# Patient Record
Sex: Male | Born: 1940 | Race: White | Hispanic: No | Marital: Married | Smoking: Former smoker
Health system: Southern US, Community
[De-identification: ages and names within clinical notes are randomized; demographics above are authoritative.]

## PROBLEM LIST (undated history)

## (undated) DIAGNOSIS — J189 Pneumonia, unspecified organism: Secondary | ICD-10-CM

## (undated) DIAGNOSIS — Z9289 Personal history of other medical treatment: Secondary | ICD-10-CM

## (undated) DIAGNOSIS — I219 Acute myocardial infarction, unspecified: Secondary | ICD-10-CM

## (undated) DIAGNOSIS — I509 Heart failure, unspecified: Secondary | ICD-10-CM

## (undated) DIAGNOSIS — I2510 Atherosclerotic heart disease of native coronary artery without angina pectoris: Secondary | ICD-10-CM

## (undated) DIAGNOSIS — I255 Ischemic cardiomyopathy: Secondary | ICD-10-CM

## (undated) DIAGNOSIS — I5022 Chronic systolic (congestive) heart failure: Secondary | ICD-10-CM

## (undated) DIAGNOSIS — M1990 Unspecified osteoarthritis, unspecified site: Secondary | ICD-10-CM

## (undated) DIAGNOSIS — R011 Cardiac murmur, unspecified: Secondary | ICD-10-CM

## (undated) DIAGNOSIS — D509 Iron deficiency anemia, unspecified: Secondary | ICD-10-CM

## (undated) DIAGNOSIS — E785 Hyperlipidemia, unspecified: Secondary | ICD-10-CM

## (undated) HISTORY — DX: Chronic systolic (congestive) heart failure: I50.22

## (undated) HISTORY — DX: Ischemic cardiomyopathy: I25.5

## (undated) HISTORY — PX: BAND HEMORRHOIDECTOMY: SHX1213

## (undated) HISTORY — DX: Hyperlipidemia, unspecified: E78.5

## (undated) HISTORY — PX: CARDIAC CATHETERIZATION: SHX172

## (undated) HISTORY — DX: Atherosclerotic heart disease of native coronary artery without angina pectoris: I25.10

## (undated) HISTORY — PX: APPENDECTOMY: SHX54

---
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
## 1979-01-26 DIAGNOSIS — I219 Acute myocardial infarction, unspecified: Secondary | ICD-10-CM

## 1979-01-26 HISTORY — DX: Acute myocardial infarction, unspecified: I21.9

## 2005-01-25 HISTORY — PX: AORTIC VALVE REPAIR: SHX6306

## 2005-01-25 HISTORY — PX: CARDIAC DEFIBRILLATOR PLACEMENT: SHX171

## 2005-01-25 HISTORY — PX: CORONARY ARTERY BYPASS GRAFT: SHX141

## 2005-02-04 ENCOUNTER — Ambulatory Visit: Payer: Self-pay | Admitting: Cardiology

## 2005-02-12 ENCOUNTER — Ambulatory Visit: Payer: Self-pay

## 2005-02-12 ENCOUNTER — Encounter: Payer: Self-pay | Admitting: Cardiology

## 2005-02-25 ENCOUNTER — Ambulatory Visit: Payer: Self-pay | Admitting: Cardiology

## 2005-03-02 ENCOUNTER — Ambulatory Visit: Payer: Self-pay | Admitting: Cardiology

## 2005-03-02 ENCOUNTER — Inpatient Hospital Stay (HOSPITAL_BASED_OUTPATIENT_CLINIC_OR_DEPARTMENT_OTHER): Admission: RE | Admit: 2005-03-02 | Discharge: 2005-03-02 | Payer: Self-pay | Admitting: Cardiology

## 2005-03-02 ENCOUNTER — Inpatient Hospital Stay (HOSPITAL_COMMUNITY): Admission: AD | Admit: 2005-03-02 | Discharge: 2005-03-19 | Payer: Self-pay | Admitting: Cardiology

## 2005-03-09 ENCOUNTER — Ambulatory Visit: Payer: Self-pay | Admitting: Internal Medicine

## 2005-03-11 ENCOUNTER — Encounter: Payer: Self-pay | Admitting: Thoracic Surgery (Cardiothoracic Vascular Surgery)

## 2005-03-22 ENCOUNTER — Ambulatory Visit: Payer: Self-pay | Admitting: Internal Medicine

## 2005-03-26 ENCOUNTER — Ambulatory Visit: Payer: Self-pay | Admitting: Cardiology

## 2005-04-07 ENCOUNTER — Ambulatory Visit: Payer: Self-pay | Admitting: Cardiology

## 2005-04-12 ENCOUNTER — Encounter
Admission: RE | Admit: 2005-04-12 | Discharge: 2005-04-12 | Payer: Self-pay | Admitting: Thoracic Surgery (Cardiothoracic Vascular Surgery)

## 2005-04-16 ENCOUNTER — Ambulatory Visit: Payer: Self-pay | Admitting: Cardiovascular Disease

## 2005-04-22 ENCOUNTER — Encounter (HOSPITAL_COMMUNITY): Admission: RE | Admit: 2005-04-22 | Discharge: 2005-07-21 | Payer: Self-pay | Admitting: Cardiology

## 2005-04-29 ENCOUNTER — Ambulatory Visit: Payer: Self-pay | Admitting: Cardiology

## 2005-05-04 ENCOUNTER — Ambulatory Visit: Payer: Self-pay | Admitting: Cardiology

## 2005-05-11 ENCOUNTER — Ambulatory Visit: Payer: Self-pay | Admitting: Cardiology

## 2005-05-21 ENCOUNTER — Ambulatory Visit: Payer: Self-pay | Admitting: Cardiology

## 2005-06-04 ENCOUNTER — Ambulatory Visit: Payer: Self-pay | Admitting: Internal Medicine

## 2005-06-10 ENCOUNTER — Ambulatory Visit: Payer: Self-pay

## 2005-06-10 ENCOUNTER — Encounter: Payer: Self-pay | Admitting: Cardiology

## 2005-06-11 ENCOUNTER — Ambulatory Visit: Payer: Self-pay | Admitting: Internal Medicine

## 2005-06-24 ENCOUNTER — Ambulatory Visit: Payer: Self-pay | Admitting: Internal Medicine

## 2005-06-29 ENCOUNTER — Ambulatory Visit: Payer: Self-pay | Admitting: Internal Medicine

## 2005-06-29 ENCOUNTER — Inpatient Hospital Stay (HOSPITAL_COMMUNITY): Admission: RE | Admit: 2005-06-29 | Discharge: 2005-06-30 | Payer: Self-pay | Admitting: Internal Medicine

## 2005-07-07 ENCOUNTER — Ambulatory Visit: Payer: Self-pay | Admitting: Cardiovascular Disease

## 2005-07-14 ENCOUNTER — Ambulatory Visit: Payer: Self-pay

## 2005-07-22 ENCOUNTER — Encounter (HOSPITAL_COMMUNITY): Admission: RE | Admit: 2005-07-22 | Discharge: 2005-10-20 | Payer: Self-pay | Admitting: Cardiology

## 2005-08-05 ENCOUNTER — Ambulatory Visit: Payer: Self-pay | Admitting: Cardiology

## 2005-10-12 ENCOUNTER — Ambulatory Visit: Payer: Self-pay | Admitting: Internal Medicine

## 2005-11-26 ENCOUNTER — Ambulatory Visit: Payer: Self-pay

## 2006-01-11 ENCOUNTER — Ambulatory Visit: Payer: Self-pay | Admitting: Internal Medicine

## 2006-02-08 ENCOUNTER — Ambulatory Visit: Payer: Self-pay | Admitting: Cardiology

## 2006-04-12 ENCOUNTER — Ambulatory Visit: Payer: Self-pay | Admitting: Internal Medicine

## 2006-07-12 ENCOUNTER — Ambulatory Visit: Payer: Self-pay | Admitting: Internal Medicine

## 2006-07-26 ENCOUNTER — Ambulatory Visit: Payer: Self-pay | Admitting: Cardiology

## 2006-09-27 ENCOUNTER — Ambulatory Visit: Payer: Self-pay | Admitting: Internal Medicine

## 2006-11-03 ENCOUNTER — Ambulatory Visit: Payer: Self-pay | Admitting: Internal Medicine

## 2006-12-27 ENCOUNTER — Ambulatory Visit: Payer: Self-pay | Admitting: Internal Medicine

## 2007-03-28 ENCOUNTER — Ambulatory Visit: Payer: Self-pay | Admitting: Internal Medicine

## 2007-06-27 ENCOUNTER — Ambulatory Visit: Payer: Self-pay | Admitting: Internal Medicine

## 2007-09-04 ENCOUNTER — Ambulatory Visit: Payer: Self-pay | Admitting: Cardiovascular Disease

## 2007-10-17 ENCOUNTER — Ambulatory Visit: Payer: Self-pay | Admitting: Internal Medicine

## 2007-10-17 ENCOUNTER — Ambulatory Visit: Payer: Self-pay

## 2007-10-17 ENCOUNTER — Encounter: Payer: Self-pay | Admitting: Cardiovascular Disease

## 2008-01-16 ENCOUNTER — Ambulatory Visit: Payer: Self-pay | Admitting: Internal Medicine

## 2008-03-07 ENCOUNTER — Encounter: Payer: Self-pay | Admitting: Internal Medicine

## 2008-03-07 ENCOUNTER — Ambulatory Visit: Payer: Self-pay | Admitting: Family Medicine

## 2008-03-07 DIAGNOSIS — M109 Gout, unspecified: Secondary | ICD-10-CM | POA: Insufficient documentation

## 2008-03-07 DIAGNOSIS — E785 Hyperlipidemia, unspecified: Secondary | ICD-10-CM | POA: Insufficient documentation

## 2008-03-07 DIAGNOSIS — I5023 Acute on chronic systolic (congestive) heart failure: Secondary | ICD-10-CM | POA: Insufficient documentation

## 2008-03-07 DIAGNOSIS — Z95810 Presence of automatic (implantable) cardiac defibrillator: Secondary | ICD-10-CM | POA: Insufficient documentation

## 2008-03-07 DIAGNOSIS — I2510 Atherosclerotic heart disease of native coronary artery without angina pectoris: Secondary | ICD-10-CM | POA: Insufficient documentation

## 2008-03-19 ENCOUNTER — Ambulatory Visit: Payer: Self-pay | Admitting: Family Medicine

## 2008-03-28 LAB — CONVERTED CEMR LAB
Albumin: 4 g/dL (ref 3.5–5.2)
Chloride: 105 meq/L (ref 96–112)
Cholesterol: 135 mg/dL (ref 0–200)
Creatinine, Ser: 1.2 mg/dL (ref 0.4–1.5)
GFR calc Af Amer: 78 mL/min
HDL: 26.3 mg/dL — ABNORMAL LOW (ref >39.0)
LDL Cholesterol: 77 mg/dL (ref 0–99)
PSA: 1.3 ng/mL (ref 0.10–4.00)
Potassium: 4.4 meq/L (ref 3.5–5.1)
Sodium: 142 meq/L (ref 135–145)
Total CHOL/HDL Ratio: 5.1
Triglycerides: 160 mg/dL — ABNORMAL HIGH (ref 0–149)

## 2008-04-15 ENCOUNTER — Ambulatory Visit: Payer: Self-pay | Admitting: Family Medicine

## 2008-04-15 LAB — CONVERTED CEMR LAB
Cholesterol, target level: 200 mg/dL
HDL goal, serum: 40 mg/dL

## 2008-04-16 ENCOUNTER — Ambulatory Visit: Payer: Self-pay | Admitting: Internal Medicine

## 2008-05-27 ENCOUNTER — Telehealth: Payer: Self-pay | Admitting: Family Medicine

## 2008-05-27 ENCOUNTER — Ambulatory Visit: Payer: Self-pay | Admitting: Family Medicine

## 2008-05-31 ENCOUNTER — Telehealth: Payer: Self-pay | Admitting: Family Medicine

## 2008-05-31 DIAGNOSIS — B029 Zoster without complications: Secondary | ICD-10-CM | POA: Insufficient documentation

## 2008-06-07 ENCOUNTER — Telehealth: Payer: Self-pay | Admitting: Family Medicine

## 2008-06-07 ENCOUNTER — Encounter: Payer: Self-pay | Admitting: Family Medicine

## 2008-06-28 ENCOUNTER — Ambulatory Visit: Payer: Self-pay | Admitting: Family Medicine

## 2008-07-02 LAB — CONVERTED CEMR LAB
Cholesterol: 159 mg/dL (ref 0–200)
LDL Cholesterol: 94 mg/dL (ref 0–99)
VLDL: 30.8 mg/dL (ref 0.0–40.0)

## 2008-07-16 ENCOUNTER — Ambulatory Visit: Payer: Self-pay | Admitting: Internal Medicine

## 2008-07-29 ENCOUNTER — Encounter: Payer: Self-pay | Admitting: Internal Medicine

## 2008-08-20 ENCOUNTER — Telehealth: Payer: Self-pay | Admitting: Family Medicine

## 2008-09-05 DIAGNOSIS — I2589 Other forms of chronic ischemic heart disease: Secondary | ICD-10-CM | POA: Insufficient documentation

## 2008-09-10 ENCOUNTER — Ambulatory Visit: Payer: Self-pay | Admitting: Cardiovascular Disease

## 2008-10-04 ENCOUNTER — Ambulatory Visit: Payer: Self-pay | Admitting: Family Medicine

## 2008-10-04 LAB — CONVERTED CEMR LAB
AST: 55 units/L — ABNORMAL HIGH (ref 0–37)
Cholesterol: 112 mg/dL (ref 0–200)
HDL: 34.4 mg/dL — ABNORMAL LOW (ref >39.00)
LDL Cholesterol: 58 mg/dL (ref 0–99)
Triglycerides: 100 mg/dL (ref 0.0–149.0)

## 2008-10-15 ENCOUNTER — Ambulatory Visit: Payer: Self-pay | Admitting: Family Medicine

## 2008-10-15 ENCOUNTER — Telehealth: Payer: Self-pay | Admitting: Family Medicine

## 2008-10-15 DIAGNOSIS — R740 Nonspecific elevation of levels of transaminase and lactic acid dehydrogenase [LDH]: Secondary | ICD-10-CM

## 2008-10-21 ENCOUNTER — Telehealth: Payer: Self-pay | Admitting: Family Medicine

## 2008-11-19 ENCOUNTER — Ambulatory Visit: Payer: Self-pay | Admitting: Internal Medicine

## 2008-12-04 ENCOUNTER — Telehealth: Payer: Self-pay | Admitting: Family Medicine

## 2009-01-13 ENCOUNTER — Ambulatory Visit: Payer: Self-pay | Admitting: Family Medicine

## 2009-01-19 LAB — CONVERTED CEMR LAB
AST: 51 units/L — ABNORMAL HIGH (ref 0–37)
Albumin: 4.1 g/dL (ref 3.5–5.2)
Bilirubin, Direct: 0.2 mg/dL (ref 0.0–0.3)
CO2: 30 meq/L (ref 19–32)
Cholesterol: 118 mg/dL (ref 0–200)
GFR calc non Af Amer: 70.74 mL/min (ref >60)
HDL: 33.4 mg/dL — ABNORMAL LOW (ref >39.00)
LDL Cholesterol: 69 mg/dL (ref 0–99)
Sodium: 138 meq/L (ref 135–145)
Total Protein: 7.2 g/dL (ref 6.0–8.3)
Triglycerides: 80 mg/dL (ref 0.0–149.0)
VLDL: 16 mg/dL (ref 0.0–40.0)

## 2009-01-20 ENCOUNTER — Telehealth: Payer: Self-pay | Admitting: Family Medicine

## 2009-01-23 ENCOUNTER — Encounter: Admission: RE | Admit: 2009-01-23 | Discharge: 2009-01-23 | Payer: Self-pay | Admitting: Family Medicine

## 2009-01-27 ENCOUNTER — Ambulatory Visit: Payer: Self-pay | Admitting: Family Medicine

## 2009-01-28 LAB — CONVERTED CEMR LAB
ALT: 47 units/L (ref 0–53)
BUN: 15 mg/dL (ref 6–23)
Bilirubin, Direct: 0.2 mg/dL (ref 0.0–0.3)
Chloride: 101 meq/L (ref 96–112)
Creatinine, Ser: 1.2 mg/dL (ref 0.4–1.5)
GFR calc non Af Amer: 63.98 mL/min (ref >60)
Potassium: 4.6 meq/L (ref 3.5–5.1)

## 2009-01-29 LAB — CONVERTED CEMR LAB
HCV Ab: NEGATIVE
Hep B C IgM: NEGATIVE

## 2009-02-18 ENCOUNTER — Ambulatory Visit: Payer: Self-pay | Admitting: Internal Medicine

## 2009-02-25 ENCOUNTER — Telehealth: Payer: Self-pay | Admitting: Family Medicine

## 2009-02-26 ENCOUNTER — Encounter: Payer: Self-pay | Admitting: Internal Medicine

## 2009-03-25 ENCOUNTER — Telehealth: Payer: Self-pay | Admitting: Family Medicine

## 2009-04-02 ENCOUNTER — Ambulatory Visit: Payer: Self-pay | Admitting: Family Medicine

## 2009-04-07 LAB — CONVERTED CEMR LAB
AST: 46 units/L — ABNORMAL HIGH (ref 0–37)
BUN: 14 mg/dL (ref 6–23)
Basophils Absolute: 0 10*3/uL (ref 0.0–0.1)
Basophils Relative: 0.7 % (ref 0.0–3.0)
Chloride: 104 meq/L (ref 96–112)
Creatinine, Ser: 1.1 mg/dL (ref 0.4–1.5)
Eosinophils Absolute: 0.2 10*3/uL (ref 0.0–0.7)
Glucose, Bld: 108 mg/dL — ABNORMAL HIGH (ref 70–99)
HDL: 38.1 mg/dL — ABNORMAL LOW (ref >39.00)
Hemoglobin: 13.8 g/dL (ref 13.0–17.0)
Lymphocytes Relative: 29.1 % (ref 12.0–46.0)
Lymphs Abs: 1.9 10*3/uL (ref 0.7–4.0)
MCHC: 33 g/dL (ref 30.0–36.0)
MCV: 94.8 fL (ref 78.0–100.0)
Monocytes Relative: 11.1 % (ref 3.0–12.0)
Neutrophils Relative %: 56.4 % (ref 43.0–77.0)
RDW: 12.5 % (ref 11.5–14.6)
Total CHOL/HDL Ratio: 3

## 2009-04-08 ENCOUNTER — Ambulatory Visit: Payer: Self-pay | Admitting: Family Medicine

## 2009-04-08 DIAGNOSIS — R7309 Other abnormal glucose: Secondary | ICD-10-CM | POA: Insufficient documentation

## 2009-04-09 ENCOUNTER — Telehealth: Payer: Self-pay | Admitting: Family Medicine

## 2009-04-24 ENCOUNTER — Telehealth (INDEPENDENT_AMBULATORY_CARE_PROVIDER_SITE_OTHER): Payer: Self-pay | Admitting: *Deleted

## 2009-05-19 ENCOUNTER — Telehealth: Payer: Self-pay | Admitting: Family Medicine

## 2009-05-20 ENCOUNTER — Ambulatory Visit: Payer: Self-pay | Admitting: Internal Medicine

## 2009-06-05 ENCOUNTER — Encounter: Payer: Self-pay | Admitting: Internal Medicine

## 2009-06-27 ENCOUNTER — Telehealth: Payer: Self-pay | Admitting: Family Medicine

## 2009-07-21 ENCOUNTER — Telehealth: Payer: Self-pay | Admitting: Family Medicine

## 2009-08-19 ENCOUNTER — Telehealth: Payer: Self-pay | Admitting: Family Medicine

## 2009-08-21 ENCOUNTER — Ambulatory Visit: Payer: Self-pay | Admitting: Internal Medicine

## 2009-08-26 ENCOUNTER — Ambulatory Visit: Payer: Self-pay | Admitting: Cardiovascular Disease

## 2009-08-26 ENCOUNTER — Ambulatory Visit (HOSPITAL_COMMUNITY): Admission: RE | Admit: 2009-08-26 | Discharge: 2009-08-26 | Payer: Self-pay | Admitting: Cardiovascular Disease

## 2009-08-26 ENCOUNTER — Encounter: Payer: Self-pay | Admitting: Cardiovascular Disease

## 2009-08-26 ENCOUNTER — Ambulatory Visit: Payer: Self-pay | Admitting: Cardiology

## 2009-08-26 ENCOUNTER — Encounter: Payer: Self-pay | Admitting: Internal Medicine

## 2009-08-27 ENCOUNTER — Telehealth: Payer: Self-pay | Admitting: Family Medicine

## 2009-09-24 ENCOUNTER — Telehealth: Payer: Self-pay | Admitting: Family Medicine

## 2009-09-30 ENCOUNTER — Ambulatory Visit: Payer: Self-pay | Admitting: Family Medicine

## 2009-09-30 LAB — CONVERTED CEMR LAB
AST: 44 units/L — ABNORMAL HIGH (ref 0–37)
Albumin: 4.1 g/dL (ref 3.5–5.2)
BUN: 20 mg/dL (ref 6–23)
CO2: 28 meq/L (ref 19–32)
Chloride: 105 meq/L (ref 96–112)
Cholesterol: 117 mg/dL (ref 0–200)
Creatinine, Ser: 1 mg/dL (ref 0.4–1.5)
Glucose, Bld: 97 mg/dL (ref 70–99)
Potassium: 4.4 meq/L (ref 3.5–5.1)
Total Bilirubin: 0.9 mg/dL (ref 0.3–1.2)
Total CHOL/HDL Ratio: 4
VLDL: 24.4 mg/dL (ref 0.0–40.0)

## 2009-10-08 ENCOUNTER — Ambulatory Visit: Payer: Self-pay | Admitting: Family Medicine

## 2009-11-18 ENCOUNTER — Ambulatory Visit: Payer: Self-pay | Admitting: Internal Medicine

## 2010-01-22 ENCOUNTER — Telehealth: Payer: Self-pay | Admitting: Family Medicine

## 2010-02-19 ENCOUNTER — Ambulatory Visit: Admit: 2010-02-19 | Payer: Self-pay | Admitting: Internal Medicine

## 2010-02-24 NOTE — Letter (Signed)
Summary: Remote Device Check  [facility], Main Office  [address]   [place], [place] [address]   Phone: [phone]  Fax: [phone]     Jun 05, 2009 MRN: [id number]   [name] Roberts [address] [place], [place]  [address]   Dear [name],   Your remote transmission was recieved and reviewed by your physician.  All diagnostics were within normal limits for you.  _____Your next transmission is scheduled for:  08-21-2009.  Please transmit at any time this day.  If you have a wireless device your transmission will be sent automatically.      Sincerely,  Vella Kohler

## 2010-02-24 NOTE — Progress Notes (Signed)
Summary: regarding niacin  Phone Note From Pharmacy   Caller: [facility]  [phone] Summary of Call: [facility] received script for naicin CR 500.  They are asking if you want to prescribe otc or niaspan.  Please advise. Initial call taken by: Lowella Petties CMA,  April 09, 2009 11:41 AM  Follow-up for Phone Call        spoke with patient and he was already taken OTC  medication discussed with dr Ermalene Searing and she said that was fine Follow-up by: Benny Lennert CMA Duncan Dull),  April 09, 2009 12:03 PM     Appended Document: regarding niacin Advised [facility] to cancel the script.

## 2010-02-24 NOTE — Assessment & Plan Note (Signed)
Summary: medtronic/saf      Allergies Added: NKDA  Visit Type:  Follow-up Primary Provider:  Kerby Nora MD   History of Present Illness: 70 yo WM with history of ICM s/p ICD, CAD s/p CABG, hyperlipidemia here today for routine cardiac follow up. He has been doing well. No complaints. He has been mowing his grass without chest pain or SOB. No ICD events. Lipids well controlled in March 2011 (see EMR).  No orthopnea, PND, lower ext edema. No intercurrent ICD therapies.  Current Medications (verified): 1)  Lisinopril 10 Mg Tabs (Lisinopril) .... Take 1 Tablet By Mouth Once A Day 2)  Carvedilol 6.25 Mg Tabs (Carvedilol) .... Take 1 Tablet By Mouth Two Times A Day 3)  Niacin Cr 500 Mg Cr-Tabs (Niacin) .Marland Kitchen.. 1 Tab By Mouth Qhs 4)  Furosemide 20 Mg Tabs (Furosemide) .... Take 1 Tablet By Mouth Once A Day 5)  Klor-Con 10 10 Meq Cr-Tabs (Potassium Chloride) .... Take 1 Tablet By Mouth Once A Day 6)  Alprazolam 1 Mg Tabs (Alprazolam) .... Take 1 Tablet By Mouth Once A Day As Needed Anxiety 7)  Simvastatin 40 Mg Tabs (Simvastatin) .Marland Kitchen.. 1 Tab By Mouth Daily 8)  Alrex 0.2 % Susp (Loteprednol Etabonate) .... One Drop Once Daily 9)  Aspirin 81 Mg  Tabs (Aspirin) .... Take 1 Tablet By Mouth Once A Day  Allergies (verified): No Known Drug Allergies  Past History:  Past Medical History: Last updated: 08/26/2009 CARDIOMYOPATHY, ISCHEMIC (ICD-414.8) CORONARY ARTERY DISEASE (ICD-414.00) s/p CABG CHRONIC SYSTOLIC HEART FAILURE (ICD-428.22) IMPLANTATION OF DEFIBRILLATOR, HX OF (ICD-V45.02) HYPERLIPIDEMIA (ICD-272.4) HERPES ZOSTER (ICD-053.9) SPECIAL SCREENING MALIGNANT NEOPLASM OF PROSTATE (ICD-V76.44) SCREENING FOR LIPOID DISORDERS (ICD-V77.91) GOUT (ICD-274.9) .  Past Surgical History: Last updated: 03/07/2008 MI age 32 Cath showed no plaque but vasospasm Cardiologist Dr.  Diona Browner. Macelheney Pneumonia 2006 CHF 2006 ECHO 2007 20%, immediately after CABG 30% Cath 2007 3 vessel  blockage CABG, ? valve repair 2007 Dr. Cornelius Moras Defibrillator Dr. Ladona Ridgel ECHO 25% 09/2007 Appendectomy   Review of Systems  The patient denies chest pain, syncope, dyspnea on exertion, and peripheral edema.    Vital Signs:  Patient profile:   70 year old male Height:      69 inches Weight:      169 pounds BMI:     25.05 Pulse rate:   58 / minute BP sitting:   82 / 50  (left arm)  Vitals Entered By: Laurance Flatten CMA (November 18, 2009 3:04 PM)  Physical Exam  General:  Well-developed,well-nourished,in no acute distress; alert,appropriate and cooperative throughout examination Head:  Normocephalic and atraumatic without obvious abnormalities. No apparent alopecia or balding. Eyes:  No corneal or conjunctival inflammation noted. EOMI. Perrla.  Vision grossly normal. Mouth:  Oral mucosa and oropharynx without lesions or exudates.  Teeth in good repair. Abdomen:  Bowel sounds positive,abdomen soft and non-tender without masses, organomegaly or hernias noted. Msk:  No deformity or scoliosis noted of thoracic or lumbar spine.   Pulses:  R and L posterior tibial pulses are full and equal bilaterally  Extremities:  no edema Neurologic:  Alert and oriented x 3.    ICD Specifications Following MD:  Lewayne Bunting, MD     ICD Vendor:  Medtronic     ICD Model Number:  7232     ICD Serial Number:  [id number] ICD DOI:  06/29/2005     ICD Implanting MD:  Lewayne Bunting, MD  Lead 1:    Location: RV  DOI: 06/29/2005     Model #: 9528     Serial #: [id number]     Status: active  Indications::  ICM   ICD Follow Up Remote Check?  No Battery Voltage:  3.06 V     Charge Time:  8.48 seconds     Underlying rhythm:  SR ICD Dependent:  No       ICD Device Measurements Right Ventricle:  Amplitude: 11.1 mV, Impedance: 448 ohms, Threshold: 1.0 V at 0.1 msec Shock Impedance: 46/58 ohms   Episodes Coumadin:  No Shock:  0     ATP:  0     Nonsustained:  0     Ventricular Pacing:  0.6%  Brady  Parameters Mode VVI     Lower Rate Limit:  40      Tachy Zones VF:  200     VT:  250     VT1:  176     Next Remote Date:  02/19/2010     Next Cardiology Appt Due:  10/26/2010 Tech Comments:  No parameter changes.  Device function normal.   6949 lead stable, SIC  0.  [facility]link transmissions every 3 months.  ROV 1 year with Dr. Ladona Ridgel. Altha Harm, LPN  November 18, 2009 3:14 PM  MD Comments:  Agree with above.  Impression & Recommendations:  Problem # 1:  IMPLANTATION OF DEFIBRILLATOR, HX OF (ICD-V45.02) His device is working normally.  Will recheck in several months.  Problem # 2:  CARDIOMYOPATHY, ISCHEMIC (ICD-414.8) He denies anginal symptoms. Continue current meds. His updated medication list for this problem includes:    Lisinopril 10 Mg Tabs (Lisinopril) .Marland Kitchen... Take 1 tablet by mouth once a day    Carvedilol 6.25 Mg Tabs (Carvedilol) .Marland Kitchen... Take 1 tablet by mouth two times a day    Furosemide 20 Mg Tabs (Furosemide) .Marland Kitchen... Take 1 tablet by mouth once a day    Aspirin 81 Mg Tabs (Aspirin) .Marland Kitchen... Take 1 tablet by mouth once a day  Problem # 3:  CHRONIC SYSTOLIC HEART FAILURE (ICD-428.22) I have asked him to reduce his sodium intake and increase his activity.  Continue meds as below. His updated medication list for this problem includes:    Lisinopril 10 Mg Tabs (Lisinopril) .Marland Kitchen... Take 1 tablet by mouth once a day    Carvedilol 6.25 Mg Tabs (Carvedilol) .Marland Kitchen... Take 1 tablet by mouth two times a day    Furosemide 20 Mg Tabs (Furosemide) .Marland Kitchen... Take 1 tablet by mouth once a day    Aspirin 81 Mg Tabs (Aspirin) .Marland Kitchen... Take 1 tablet by mouth once a day  Patient Instructions: 1)  Your physician wants you to follow-up in: 12 months with Dr Court Joy will receive a reminder letter in the mail two months in advance. If you don't receive a letter, please call our office to schedule the follow-up appointment.  Prevention & Chronic Care Immunizations   Influenza vaccine: given  (09/25/2008)    Influenza vaccine due: Refused  (10/08/2009)    Tetanus booster: 04/15/2008: Td   Tetanus booster due: 04/16/2018    Pneumococcal vaccine: given  (01/25/2006)   Pneumococcal vaccine due: 01/26/2011    H. zoster vaccine: Not documented  Colorectal Screening   Hemoccult: Not documented   Hemoccult due: Not Indicated    Colonoscopy: Hyperplastic Polyp  (01/25/2006)   Colonoscopy due: 03/08/2011  Other Screening   PSA: 2.40  (04/02/2009)   PSA due due: 03/19/2009   Smoking status: quit  (03/07/2008)  Lipids   Total Cholesterol: 117  (09/30/2009)   LDL: 60  (09/30/2009)   LDL Direct: Not documented   HDL: 32.90  (09/30/2009)   Triglycerides: 122.0  (09/30/2009)    SGOT (AST): 44  (09/30/2009)   SGPT (ALT): 45  (09/30/2009)   Alkaline phosphatase: 46  (09/30/2009)   Total bilirubin: 0.9  (09/30/2009)  Self-Management Support :    Lipid self-management support: Not documented

## 2010-02-24 NOTE — Progress Notes (Signed)
Summary: refill request for xanax  Phone Note Refill Request Message from:  Fax from Pharmacy  Refills Requested: Medication #1:  ALPRAZOLAM 0.5 MG TABS Take 1 tablet by mouth once a day   Last Refilled: 03/26/2009 Faxed [facility] [address] , [phone].  Initial call taken by: Lowella Petties CMA,  April 24, 2009 9:24 AM  Follow-up for Phone Call        Rx called to pharmacy Follow-up by: Benny Lennert CMA Duncan Dull),  April 24, 2009 4:38 PM    Prescriptions: ALPRAZOLAM 0.5 MG TABS (ALPRAZOLAM) Take 1 tablet by mouth once a day  #30 x 0   Entered and Authorized by:   Kerby Nora MD   Signed by:   Kerby Nora MD on 04/24/2009   Method used:   Telephoned to ...       [facility]  [address] [id number] ([facility]ail)       [address]       [place], [place]  [address]       Ph: [phone] or [phone]       Fax: [phone]   RxID:   [id number]

## 2010-02-24 NOTE — Progress Notes (Signed)
Summary: alprazolam  Phone Note Refill Request Message from:  Fax from Pharmacy on September 24, 2009 10:35 AM  Refills Requested: Medication #1:  ALPRAZOLAM 1 MG TABS take one tablet as needed   Last [facility]: [facility] [phone]  Initial call taken by: Melody Comas,  September 24, 2009 10:48 AM  Follow-up for Phone Call        Rx called to pharmacy Follow-up by: Benny Lennert CMA Duncan Dull),  September 24, 2009 11:47 AM    Prescriptions: ALPRAZOLAM 1 MG TABS (ALPRAZOLAM) take one tablet as needed  #30 x 0   Entered and Authorized by:   Kerby Nora MD   Signed by:   Kerby Nora MD on 09/24/2009   Method used:   Telephoned to ...       [facility]  [address] [id number] ([facility]       [address]       [place], [place]  [address]       Ph: [phone] or [phone]       Fax: [phone]   RxID:   [id number]

## 2010-02-24 NOTE — Letter (Signed)
Summary: Remote Device Check  [facility], Main Office  [address]   [place], [place] [address]   Phone: [phone]  Fax: [phone]     February 26, 2009 MRN: [id number]   [name] Roberts [address] [place], [place]  [address]   Dear Mr. Mersch,   Your remote transmission was recieved and reviewed by your physician.  All diagnostics were within normal limits for you.  __X___Your next transmission is scheduled for:      May 20, 2009.  Please transmit at any time this day.  If you have a wireless device your transmission will be sent automatically.      Sincerely,  Proofreader

## 2010-02-24 NOTE — Progress Notes (Signed)
Summary: refill request for alprazolam  Phone Note Refill Request Message from:  Fax from Pharmacy  Refills Requested: Medication #1:  ALPRAZOLAM 0.5 MG TABS Take 1 tablet by mouth once a day Faxed request from [facility] [address]  Request is for 1 mg's, chart says .5 mg's.  [phone].  Initial call taken by: Lowella Petties CMA,  August 19, 2009 11:29 AM  Follow-up for Phone Call        A little early, patient is on 0.5, not 1 mg tabs  will not change and let my partner know Follow-up by: Hannah Beat MD,  August 19, 2009 12:45 PM  Additional Follow-up for Phone Call Additional follow up Details #1::        Pharmacy notified.Consuello Masse CMA   Additional Follow-up by: Benny Lennert CMA Duncan Dull),  August 19, 2009 2:44 PM

## 2010-02-24 NOTE — Progress Notes (Signed)
Summary: Rx Alprazolam  Phone Note Refill Request Call back at [phone] Message from:  [facility] on May 19, 2009 9:39 AM  Refills Requested: Medication #1:  ALPRAZOLAM 0.5 MG TABS Take 1 tablet by mouth once a day   Last Refilled: 04/24/2009 Received faxed refill request please advise.   Method Requested: Telephone to Pharmacy Initial call taken by: Linde Gillis CMA Duncan Dull),  May 19, 2009 9:39 AM  Follow-up for Phone Call        Rx [facility] Follow-up by: Benny Lennert CMA Duncan Dull),  May 20, 2009 8:34 AM    Prescriptions: ALPRAZOLAM 0.5 MG TABS (ALPRAZOLAM) Take 1 tablet by mouth once a day  #30 x 0   Entered and Authorized by:   Kerby Nora MD   Signed by:   Kerby Nora MD on 05/20/2009   Method used:   Telephoned to ...       [facility]  [address] [id number] ([facility]       [address]       [place], [place]  [address]       Ph: [phone] or [phone]       Fax: [phone]   RxID:   [id number]

## 2010-02-24 NOTE — Letter (Signed)
Summary: Remote Device Check  [facility], Main Office  [address]   [place], [place] [address]   Phone: [phone]  Fax: [phone]     August 26, 2009 MRN: [id number]   [name] Roberts [address] [place], [place]  [address]   Dear [name],   Your remote transmission was recieved and reviewed by your physician.  All diagnostics were within normal limits for you.    __X____Your next office visit is scheduled for: October 2011 with Dr Ladona Ridgel . Please call our office to schedule an appointment.    Sincerely,  Vella Kohler
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
## 2010-02-24 NOTE — Progress Notes (Signed)
Summary: alprazolam/ pharmacy needs directions  Phone Note Call from Patient Call back at [phone]   Caller: [facility] Call For: Kerby Nora MD Summary of Call: [facility] pharmacy says that they need more specific directions for the alprazolam before they can fill it. Please advise.  Initial call taken by: Melody Comas,  September 24, 2009 1:18 PM  Follow-up for Phone Call        Rx Called In to [facility]. Follow-up by: Linde Gillis CMA (AAMA),  September 24, 2009 1:28 PM    New/Updated Medications: ALPRAZOLAM 1 MG TABS (ALPRAZOLAM) Take 1 tablet by mouth once a day as needed anxiety Prescriptions: ALPRAZOLAM 1 MG TABS (ALPRAZOLAM) Take 1 tablet by mouth once a day as needed anxiety  #30 x 0   Entered and Authorized by:   Kerby Nora MD   Signed by:   Kerby Nora MD on 09/24/2009   Method used:   Telephoned to ...       [id number] (retail)       [address]       [place], [place]  [address]       Ph: [phone] or [phone]       Fax: [phone]   RxID:   [id number]

## 2010-02-24 NOTE — Cardiovascular Report (Signed)
Summary: Office Visit Remote   Office Visit Remote   Imported By: Roderic Ovens 06/09/2009 15:37:01  _____________________________________________________________________  External Attachment:    Type:   Image     Comment:   External Document
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
## 2010-02-24 NOTE — Cardiovascular Report (Signed)
Summary: Office Visit Remote   Office Visit Remote   Imported By: Roderic Ovens 08/28/2009 16:07:32  _____________________________________________________________________  External Attachment:    Type:   Image     Comment:   External Document

## 2010-02-24 NOTE — Cardiovascular Report (Signed)
Summary: Office Visit Remote   Office Visit Remote   Imported By: Roderic Ovens 03/03/2009 16:34:34  _____________________________________________________________________  External Attachment:    Type:   Image     Comment:   External Document

## 2010-02-24 NOTE — Progress Notes (Signed)
Summary: Rx Alprazolam  Phone Note Refill Request Call back at [phone]   Refills Requested: Medication #1:  ALPRAZOLAM 0.5 MG TABS Take 1 tablet by mouth once a day Received faxed refill request, please advise   Method Requested: Telephone to Pharmacy Initial call taken by: Linde Gillis CMA Duncan Dull),  March 25, 2009 4:27 PM  Follow-up for Phone Call        Rx called to pharmacy Follow-up by: Benny Lennert CMA [facility] Dull),  March 26, 2009 10:11 AM    Prescriptions: ALPRAZOLAM 0.5 MG TABS (ALPRAZOLAM) Take 1 tablet by mouth once a day  #30 x 0   Entered and Authorized by:   Kerby Nora MD   Signed by:   Kerby Nora MD on 03/25/2009   Method used:   Telephoned to ...       [facility] [phone]* (retail)       [address] [place], [place]  [address]       Ph: [phone]       Fax: [phone]   RxID:   [id number]

## 2010-02-24 NOTE — Progress Notes (Signed)
Summary: refill request for alprazolam  Phone Note Refill Request Message from:  Fax from Pharmacy  Refills Requested: Medication #1:  ALPRAZOLAM 0.5 MG TABS Take 1 tablet by mouth once a day   Last Refilled: 05/22/2009 [facility], [phone].  Initial call taken by: Lowella Petties CMA,  June 27, 2009 11:22 AM    Prescriptions: ALPRAZOLAM 0.5 MG TABS (ALPRAZOLAM) Take 1 tablet by mouth once a day  #30 x 0   Entered and Authorized by:   Kerby Nora MD   Signed by:   Kerby Nora MD on 06/27/2009   Method used:   Telephoned to ...       [facility]  [address] [id number] ([facility]ail)       [address]       [place], [place]  [address]       Ph: [phone] or [phone]       Fax: [phone]   RxID:   [id number]   Appended Document: refill request for alprazolam Rx called to pharmacy

## 2010-02-24 NOTE — Progress Notes (Signed)
Summary: refill request for xanax  Phone Note Refill Request Call back at Home Phone [phone] Message from:  Patient  Refills Requested: Medication #1:  ALPRAZOLAM 1 MG TABS take one tablet as needed Phoned request from pt, [facility] to [facility] [address].  Initial call taken by: Lowella Petties CMA,  August 27, 2009 10:32 AM  Follow-up for Phone Call        Rx called to pharmacy Follow-up by: Benny Lennert CMA Duncan Dull),  August 27, 2009 11:25 AM    Prescriptions: ALPRAZOLAM 1 MG TABS (ALPRAZOLAM) take one tablet as needed  #30 x 0   Entered and Authorized by:   Kerby Nora MD   Signed by:   Kerby Nora MD on 08/27/2009   Method used:   Telephoned to ...       [facility]  [address] [id number] ([facility]ail)       [address]       [place], [place]  [address]       Ph: [phone] or [phone]       Fax: [phone]   RxID:   [id number]

## 2010-02-24 NOTE — Progress Notes (Signed)
Summary: refill request for alprazolam  Phone Note Refill Request Message from:  Fax from Pharmacy  Refills Requested: Medication #1:  ALPRAZOLAM 0.5 MG TABS Take 1 tablet by mouth once a day   Last Refilled: 06/27/2009 [facility]axed [facility] [address], phone [phone]  Initial call taken by: Lowella Petties CMA,  July 21, 2009 4:17 PM  Follow-up for Phone Call        Okay to fill # 30 0 refills at end of week on 7/1 Follow-up by: Kerby Nora MD,  July 22, 2009 1:13 PM  Additional Follow-up for Phone Call Additional follow up Details #1::        rx called to pharmacy with instructins not to fill until 07-25-2009 Additional Follow-up by: Benny Lennert CMA Duncan Dull),  July 22, 2009 2:21 PM

## 2010-02-24 NOTE — Progress Notes (Signed)
Summary: ALPRAZOLAM  Phone Note Refill Request Message from:  [facility] [phone] on February 25, 2009 5:19 PM  Refills Requested: Medication #1:  ALPRAZOLAM 0.5 MG TABS Take 1 tablet by mouth once a day   Last Refilled: 01/21/2009  Method Requested: Telephone to Pharmacy Initial call taken by: Mervin Hack CMA Duncan Dull),  February 25, 2009 5:19 PM  Follow-up for Phone Call        Rx called to pharmacy, [facility] Follow-up by: Linde Gillis CMA [facility]),  February 26, 2009 11:27 AM    Prescriptions: ALPRAZOLAM 0.5 MG TABS (ALPRAZOLAM) Take 1 tablet by mouth once a day  #30 x 0   Entered and Authorized by:   Kerby Nora MD   Signed by:   Kerby Nora MD on 02/25/2009   Method used:   Telephoned to ...       [facility]  [address] [id number]       [address]       [place], [place]  [address]       Ph: [phone] or [phone]       Fax: [phone]   RxID:   [id number]

## 2010-02-26 NOTE — Progress Notes (Signed)
Summary: refill request for alprazolam  Phone Note Refill Request Message from:  Fax from Pharmacy  Refills Requested: Medication #1:  ALPRAZOLAM 1 MG TABS Take 1 tablet by mouth once a day as needed anxiety   Last Refilled: 12/22/2009 Faxed request from [facility], phone [phone].  Initial call taken by: Lowella Petties CMA, AAMA,  January 22, 2010 8:38 AM  Follow-up for Phone Call        Rx called to pharmacy Follow-up by: Benny Lennert CMA Duncan Dull),  January 23, 2010 8:19 AM    Prescriptions: ALPRAZOLAM 1 MG TABS (ALPRAZOLAM) Take 1 tablet by mouth once a day as needed anxiety  #30 x 1   Entered and Authorized by:   Kerby Nora MD   Signed by:   Kerby Nora MD on 01/22/2010   Method used:   Telephoned to ...       [facility]  [address] [id number]       [address]       [place], [place]  [address]       Ph: [phone] or [phone]       Fax: [phone]   RxID:   [id number]

## 2010-03-01 ENCOUNTER — Encounter (INDEPENDENT_AMBULATORY_CARE_PROVIDER_SITE_OTHER): Payer: Self-pay | Admitting: *Deleted

## 2010-03-04 ENCOUNTER — Encounter: Payer: Self-pay | Admitting: Family Medicine

## 2010-03-05 ENCOUNTER — Encounter (INDEPENDENT_AMBULATORY_CARE_PROVIDER_SITE_OTHER): Payer: BC Managed Care – PPO

## 2010-03-05 ENCOUNTER — Encounter: Payer: Self-pay | Admitting: Internal Medicine

## 2010-03-05 DIAGNOSIS — I428 Other cardiomyopathies: Secondary | ICD-10-CM

## 2010-03-12 NOTE — Letter (Signed)
Summary: Device-Delinquent Phone Journalist, newspaper, Main Office  [address]   [place], [place] [address]   Phone: [phone]  Fax: [phone]     March 01, 2010 MRN: [id number]   REHMAN LEVINSON [address] [place], [place]  [address]   Dear Mr. Degracia,  According to our records, you were scheduled for a device phone transmission on 02-19-10.     We did not receive any results from this check.  If you transmitted on your scheduled day, please call us to help troubleshoot your system.  If you forgot to send your transmission, please send one upon receipt of this letter.  Thank you,   Arch[facility]

## 2010-03-18 NOTE — Letter (Signed)
Summary: [facility]   [facility]   Imported By: Kassie Mends 03/09/2010 11:24:29  _____________________________________________________________________  External Attachment:    Type:   Image     Comment:   External Document

## 2010-03-27 ENCOUNTER — Telehealth: Payer: Self-pay | Admitting: Family Medicine

## 2010-04-02 NOTE — Progress Notes (Signed)
Summary: refill request for alprazolam  Phone Note Refill Request Message from:  Fax from Pharmacy  Refills Requested: Medication #1:  ALPRAZOLAM 1 MG TABS Take 1 tablet by mouth once a day as needed anxiety   Last Refilled: 02/23/2010 Faxed request from [facility], phone [phone].  Initial call taken by: Lowella Petties CMA, AAMA,  March 27, 2010 11:28 AM  Follow-up for Phone Call        Rx called to pharmacy Follow-up by: Benny Lennert CMA Duncan Dull),  March 27, 2010 1:41 PM    Prescriptions: ALPRAZOLAM 1 MG TABS (ALPRAZOLAM) Take 1 tablet by mouth once a day as needed anxiety  #30 x 1   Entered and Authorized by:   Kerby Nora MD   Signed by:   Kerby Nora MD on 03/27/2010   Method used:   Telephoned to ...       [facility]  [address] [id number] ([facility]ail)       [address]       [place], [place]  [address]       Ph: [phone] or [phone]       Fax: [phone]   RxID:   [id number]

## 2010-04-09 ENCOUNTER — Encounter: Payer: Self-pay | Admitting: *Deleted

## 2010-04-09 ENCOUNTER — Other Ambulatory Visit: Payer: Self-pay | Admitting: Family Medicine

## 2010-04-09 ENCOUNTER — Other Ambulatory Visit (INDEPENDENT_AMBULATORY_CARE_PROVIDER_SITE_OTHER): Payer: BC Managed Care – PPO

## 2010-04-09 DIAGNOSIS — E785 Hyperlipidemia, unspecified: Secondary | ICD-10-CM

## 2010-04-09 DIAGNOSIS — R7309 Other abnormal glucose: Secondary | ICD-10-CM

## 2010-04-09 DIAGNOSIS — Z125 Encounter for screening for malignant neoplasm of prostate: Secondary | ICD-10-CM

## 2010-04-09 DIAGNOSIS — R740 Nonspecific elevation of levels of transaminase and lactic acid dehydrogenase [LDH]: Secondary | ICD-10-CM

## 2010-04-09 LAB — BASIC METABOLIC PANEL
CO2: 28 mEq/L (ref 19–32)
Calcium: 9.4 mg/dL (ref 8.4–10.5)
Chloride: 104 mEq/L (ref 96–112)
GFR: 67.64 mL/min (ref >60.00)
Sodium: 138 mEq/L (ref 135–145)

## 2010-04-09 LAB — PSA: PSA: 1.94 ng/mL (ref 0.10–4.00)

## 2010-04-09 LAB — LIPID PANEL
Cholesterol: 114 mg/dL (ref 0–200)
LDL Cholesterol: 58 mg/dL (ref 0–99)
Total CHOL/HDL Ratio: 3
VLDL: 19.2 mg/dL (ref 0.0–40.0)

## 2010-04-09 LAB — HEPATIC FUNCTION PANEL
AST: 46 U/L — ABNORMAL HIGH (ref 0–37)
Alkaline Phosphatase: 46 U/L (ref 39–117)

## 2010-04-13 ENCOUNTER — Encounter: Payer: Self-pay | Admitting: *Deleted

## 2010-04-21 ENCOUNTER — Encounter: Payer: Self-pay | Admitting: Family Medicine

## 2010-04-21 ENCOUNTER — Other Ambulatory Visit: Payer: Self-pay | Admitting: Family Medicine

## 2010-04-23 NOTE — Cardiovascular Report (Signed)
Summary: Office Visit   Office Visit   Imported By: Roderic Ovens 04/14/2010 15:04:41  _____________________________________________________________________  External Attachment:    Type:   Image     Comment:   External Document

## 2010-04-23 NOTE — Letter (Signed)
Summary: Remote Device Check  [facility] [employer], Main Office  [address]   [place], [place] [address]   Phone: [phone]  Fax: [phone]     April 13, 2010 MRN: [id number]   [name] Roberts [address] [place], [place]  [address]   Dear [name],   Your remote transmission was recieved and reviewed by your physician.  All diagnostics were within normal limits for you.  __X___Your next transmission is scheduled for:  06-18-10.  Please transmit at any time this day.  If you have a wireless device your transmission will be sent automatically.   Sincerely,  Vella Kohler

## 2010-05-22 ENCOUNTER — Ambulatory Visit (INDEPENDENT_AMBULATORY_CARE_PROVIDER_SITE_OTHER): Payer: BC Managed Care – PPO | Admitting: Family Medicine

## 2010-05-22 ENCOUNTER — Encounter: Payer: Self-pay | Admitting: Family Medicine

## 2010-05-22 DIAGNOSIS — R7309 Other abnormal glucose: Secondary | ICD-10-CM

## 2010-05-22 DIAGNOSIS — I2589 Other forms of chronic ischemic heart disease: Secondary | ICD-10-CM

## 2010-05-22 DIAGNOSIS — E785 Hyperlipidemia, unspecified: Secondary | ICD-10-CM

## 2010-05-22 DIAGNOSIS — I2510 Atherosclerotic heart disease of native coronary artery without angina pectoris: Secondary | ICD-10-CM

## 2010-05-22 DIAGNOSIS — R7401 Elevation of levels of liver transaminase levels: Secondary | ICD-10-CM

## 2010-05-22 DIAGNOSIS — Z0000 Encounter for general adult medical examination without abnormal findings: Secondary | ICD-10-CM

## 2010-05-22 MED ORDER — ALPRAZOLAM 1 MG PO TABS: 1.0000 mg | ORAL_TABLET | Freq: Every day | ORAL | Status: DC | PRN

## 2010-05-22 NOTE — Assessment & Plan Note (Signed)
Stable.. Likely due to statin and fatty liver.. Improved over time.

## 2010-05-22 NOTE — Progress Notes (Signed)
Subjective:    Patient ID: [name] Roberts, male    DOB: 02-Nov-1940, 70 y.o.   MRN: [id number]  HPI I have personally reviewed the Medicare Annual Wellness questionnaire and have noted 1. The patient's medical and social history 2. Their use of alcohol, tobacco or illicit drugs 3. Their current medications and supplements 4. The patient's functional ability including ADL's, fall risks, home safety risks and hearing or visual             impairment. 5. Diet and physical activities 6. Evidence for depression or mood disorders The patients weight, height, BMI and visual acuity have been recorded in the chart I have made referrals, counseling and provided education to the patient based review of the above and I have provided the pt with a written personalized care plan for preventive services.  High cholesterol: Well controlled on simvastain 40 mg daily.   CAD, CHF: Seen cards [facility] 08/2009..stable on current meds  history of ICM s/p ICD, CAD s/p CABG  He has been mowing his grass without chest pain or SOB. No ICD events. No orthopnea, PND, lower ext edema.   Prediabetes: improved  Limiting sweets some.  Walking 2 miles every morning.  Work Systems developer at [employer].   Anxiety, well controlled..using nightly for insomnia.  Main issue is that coreg "jacks him up".  No daytime anxiety, no depression.  No SI.      Review of Systems  Constitutional: Negative for fever, fatigue and unexpected weight change.  HENT: Negative for ear pain, congestion, sore throat, rhinorrhea, trouble swallowing and postnasal drip.   Eyes: Negative for pain.  Respiratory: Negative for cough, shortness of breath and wheezing.   Cardiovascular: Negative for chest pain, palpitations and leg swelling.  Gastrointestinal: Negative for nausea, abdominal pain, diarrhea, constipation and blood in stool.  Genitourinary: Negative for dysuria, urgency, hematuria, discharge, penile swelling, scrotal swelling,  difficulty urinating, penile pain and testicular pain.  Skin: Negative for rash.  Neurological: Negative for syncope, weakness, light-headedness, numbness and headaches.  Psychiatric/Behavioral: Negative for behavioral problems and dysphoric mood. The patient is not nervous/anxious.        Objective:   Physical Exam  Constitutional: He appears well-developed and well-nourished.  Non-toxic appearance. He does not appear ill. No distress.  HENT:  Head: Normocephalic and atraumatic.  Right Ear: Hearing, tympanic membrane, external ear and ear canal normal.  Left Ear: Hearing, tympanic membrane, external ear and ear canal normal.  Nose: Nose normal.  Mouth/Throat: Uvula is midline, oropharynx is clear and moist and mucous membranes are normal.  Eyes: Conjunctivae, EOM and lids are normal. Pupils are equal, round, and reactive to light. No foreign bodies found.  Neck: Trachea normal, normal range of motion and phonation normal. Neck supple. Carotid bruit is not present. No mass and no thyromegaly present.  Cardiovascular: Normal rate, regular rhythm, S1 normal, S2 normal, intact distal pulses and normal pulses.  Exam reveals no gallop.   No murmur heard. Pulmonary/Chest: Breath sounds normal. He has no wheezes. He has no rhonchi. He has no rales.  Abdominal: Soft. Normal appearance and bowel sounds are normal. There is no hepatosplenomegaly. There is no tenderness. There is no rebound, no guarding and no CVA tenderness. No hernia. Hernia confirmed negative in the right inguinal area and confirmed negative in the left inguinal area.  Genitourinary: Prostate normal, testes normal and penis normal. Rectal exam shows no external hemorrhoid, no internal hemorrhoid, no fissure, no mass, no tenderness and anal tone normal.  Prostate is not enlarged and not tender. Right testis shows no mass and no tenderness. Left testis shows no mass and no tenderness. No paraphimosis or penile tenderness.    Lymphadenopathy:    He has no cervical adenopathy.       Right: No inguinal adenopathy present.       Left: No inguinal adenopathy present.  Neurological: He is alert. He has normal strength and normal reflexes. No cranial nerve deficit or sensory deficit. Gait normal.  Skin: Skin is warm, dry and intact. No rash noted.  Psychiatric: He has a normal mood and affect. His speech is normal and behavior is normal. Judgment normal.          Assessment & Plan:  Annual Medicare Wellness The patient's preventative maintenance and recommended screening tests for an annual wellness exam were reviewed in full today. Brought up to date unless services declined.  Counselled on the importance of diet, exercise, and its role in overall health and mortality. The patient's FH and SH was reviewed, including their home life, tobacco status, and drug and alcohol status.

## 2010-05-22 NOTE — Assessment & Plan Note (Signed)
Stable per cards. 

## 2010-05-22 NOTE — Assessment & Plan Note (Signed)
Resolved

## 2010-05-22 NOTE — Patient Instructions (Addendum)
Call insurance regarding shingles vaccine.. Call us if interested. Continue regular exercise and work on healthy high fiber low fat and carbohydrate diet.

## 2010-05-22 NOTE — Assessment & Plan Note (Signed)
Stable

## 2010-05-22 NOTE — Assessment & Plan Note (Signed)
LDL at goal <70 on medication.

## 2010-06-09 NOTE — Assessment & Plan Note (Signed)
[facility]                            CARDIOLOGY OFFICE NOTE   [name] Roberts, [name] Roberts                      MRN:          [id number]  DATE:09/04/2007                            DOB:          1940-12-29    REFERRING PHYSICIAN:  Mosetta Roberts, M.D.   REASON FOR ADMISSION:  One-year Cardiology followup.   HISTORY OF PRESENT ILLNESS:  [name] Roberts has a history of coronary  artery disease with an ischemic cardiomyopathy as well as  hyperlipidemia.  He returns today for his routine yearly followup.  He  reports that he has been doing well from a cardiovascular standpoint.  He has been mowing his lawn with a push lawn mower this summer and has  experienced no chest discomfort or dyspnea while doing so.  He denies  any angina, dyspnea on exertion, palpitations, dizziness, near-syncope,  syncope, orthopnea, paroxysmal nocturnal dyspnea, or lower extremity  edema.  The patient is also status post defibrillator implantation and  denies any defibrillator discharges.  He will be seen by [name] Roberts on October 17, 2007 for ICD follow-up.  He does transmit  through the telephone every 3 months.   ALLERGIES:  No known drug allergies.   CURRENT MEDICATIONS:  1. Lisinopril 10 mg once daily.  2. Aspirin 81 mg once daily.  3. Lasix 20 mg once daily.  4. Coreg 6.25 mg twice daily.  5. Klor-Con 10 mEq once daily.  6. Zocor 40 mg once daily.   REVIEW OF SYSTEMS:  As described in the history of  present illness, is  otherwise negative.   PHYSICAL EXAMINATION:  GENERAL:  He is a pleasant elderly Caucasian  male, in no acute distress.  VITAL SIGNS:  His blood pressure is 128/72, pulse is 62 and regular,  respirations are 12 and nonlabored.  NECK:  No JVD.  No carotid bruits.  SKIN:  Warm and dry.  LUNGS:  Clear to auscultation bilaterally without wheezes, rhonchi, or  crackles noted.  CARDIOVASCULAR:  Regular rate and rhythm with a normal S1 and S2 and  a  mild systolic murmur over the base.  There is no S3 or S4 noted.  The  patient's device pocket is stable and well healed.  EXTREMITIES:  No  lower extremity edema.   DIAGNOSTIC STUDIES:  A 12-lead electrocardiogram is unchanged from prior  EKG.  There is sinus rhythm with a ventricular rate of 62 beats per  minute and PVC is noted.  There is left atrial enlargement, low voltage  QRS.  There are Q-waves in leads 1 and aVL suggestive of a prior lateral  infarction.  There is a poor R-wave progression throughout the  precordial leads.   ASSESSMENT AND PLAN:  A 70 year old Caucasian male with a past medical  history significant for coronary artery disease status post three-vessel  coronary artery bypass grafting surgery in 2007, ischemic cardiomyopathy  with ejection fraction of 25-30%, status post implantation of ICD 2007,  and history of hyperlipidemia who was seen today for a routine  Cardiology followup.  The patient is also status post mitral valve  annuloplasty in 2007.  He is doing well today from a cardiovascular  standpoint.  I will continue his current medical therapy and will plan  on performing a surface echocardiogram to assess his left ventricular  function, on the same day that he returns to see [name] Roberts, on  October 17, 2007.  He will continue to follow with [name] Roberts in 6  months.  I have encouraged him to contact our office should he develop  any symptoms suggestive of angina or dyspnea with exertion prior to his  next appointment in our office.     [name] Carrow, Roberts  Electronically Signed    CM/MedQ  DD: 09/04/2007  DT: 09/05/2007  Job #: [id number]   cc:   [name] Roberts, [facility]  [name] Roberts, M.D.

## 2010-06-09 NOTE — Assessment & Plan Note (Signed)
[facility]                            CARDIOLOGY OFFICE NOTE   IZAN, MIRON                      MRN:          [id number]  DATE:07/26/2006                            DOB:          1940-03-11    REFERRING PHYSICIAN:  Mosetta Putt, M.D.   REASON FOR VISIT:  Cardiology followup.   HISTORY OF PRESENT ILLNESS:  Mr. Balis continues to do well with an  ischemic cardiomyopathy.  He reports doing all of his usual activities  of daily living including outdoor work without any major limiting  symptoms.  He has NYHA Class II dyspnea on exertion, and is not  reporting any angina.  Electrocardiogram is stable, showing continued  poor R wave progression as noted previously.  He reports that his LDL  was 68 on the present dose of Zocor.  I reviewed his medications, and  we talked about maintaining regular exercise and diet.  He denies any  defibrillator discharges, and is having this followed  transtelephonically every 3 months with a scheduled visit with Dr.  Ladona Ridgel in September.   ALLERGIES:  NO KNOWN DRUG ALLERGIES.   MEDICATIONS:  1. Aspirin 81 mg p.o. daily.  2. Lisinopril 10 mg p.o. daily.  3. Simvastatin 40 mg p.o. daily.  4. Lasix 20 mg p.o. daily.  5. Coreg 6.25 mg p.o. b.i.d.  6. Klor-Con 10 mEq p.o. daily.   REVIEW OF SYSTEMS:  As described in history of present illness,  otherwise negative.   PHYSICAL EXAMINATION:  Blood pressure is 101/59.  Heart rate is 58.  Weight is 185 pounds, which is stable.  Patient is comfortable, and in no acute distress.  Examination of the neck reveals no elevated jugular venous pressure  without bruits.  No thyromegaly is noted.  LUNGS:  Clear.  No labored breathing at rest.  CARDIAC:  Exam reveals a regular rate and rhythm.  No S3 gallop.  Device  pocket site is stable and well healed.  EXTREMITIES:  Exhibit no pitting edema.   IMPRESSION AND RECOMMENDATIONS:  1. Ischemic cardiomyopathy with an  ejection fraction of 25% to 30%.      Status post revascularization surgery, mitral valve annuloplasty      and single-chamber defibrillator placement.  Patient is doing quite      well symptomatically, and we will plan to continue medical therapy      with scheduled yearly followup overlapping Dr. Geoffery Lyons followup      by 6 months.  If any significant interval symptoms develop, we can      certainly see him back      sooner.  2. Further plans to follow.     Jonelle Sidle, MD  Electronically Signed    SGM/MedQ  DD: 07/26/2006  DT: 07/26/2006  Job #: [id number]   cc:   Mosetta Putt, M.D.

## 2010-06-09 NOTE — Assessment & Plan Note (Signed)
[facility]                         ELECTROPHYSIOLOGY OFFICE NOTE   NAME:ROLLINSONMd, [name] Roberts                      MRN:          [id number]  DATE:10/17/2007                            DOB:          1940/02/14    HISTORY OF PRESENT ILLNESS:  [name] returns today for followup.  He is a very pleasant male with a history of an ischemic cardiomyopathy,  class I to II, congestive heart failure status post ICD insertion.  He  returns today for followup.  He denies any intercurrent chest pain or  shortness of breath.  He has been very stable.  He has had no  intercurrent ICD therapies.   MEDICATIONS:  1. Lasix 20 a day.  2. Lisinopril 10 a day.  3. Coreg 6.25 twice a day.  4. Aspirin 81 a day.  5. Potassium 10 a day.  6. Zocor 40 a day.   PHYSICAL EXAMINATION:  GENERAL:  He is a pleasant, well-appearing middle-  aged man in no distress.  VITAL SIGNS:  Blood pressure 106/63, the pulse 52 and regular,  respirations were 18, the weight was 176 pounds.  NECK:  Revealed no jugular distention.  LUNGS:  Clear bilaterally to auscultation.  No wheezes, rales, or  rhonchi are present.  CARDIOVASCULAR:  Revealed regular rate and rhythm.  Normal S1 and S2.  EXTREMITIES:  Demonstrated no edema.   Interrogation of his defibrillator demonstrates a Medtronic Maximo with  R-waves of 12, the impedance 500, the threshold volt of 0.2.  Battery  voltage was 3.11 volts.  There are no intercurrent ICD therapies.  He  has had no short coupled RR intervals with his [id number] Sprint Fidelis lead.  He was V paced 60% of the time.   IMPRESSION:  1. Ischemic cardiomyopathy.  2. Congestive heart failure class I.  3. Dyslipidemia.   DISCUSSION:  [name] is stable.  His congestive heart failure is  well controlled.  He is on Zocor for his dyslipidemia and his  defibrillator is working normally.  We will see the patient back in the  office for ICD followup again in  1-year.     Doylene Canning. Ladona Ridgel, MD  Electronically Signed    GWT/MedQ  DD: 10/17/2007  DT: 10/17/2007  Job #: [id number]   cc:   Mosetta Putt, M.D.

## 2010-06-09 NOTE — Assessment & Plan Note (Signed)
[facility]                         ELECTROPHYSIOLOGY OFFICE NOTE   NAME:ROLLINSONJanthony, Holleman                      MRN:          [id number]  DATE:09/27/2006                            DOB:          May 25, 1940    Mr. Fabela returns today for followup.  He is a very pleasant middle-  aged male with a history of an ischemic cardiomyopathy, status post MI  in the past with severe LV dysfunction, EF of 30%.  He returns today for  followup.  He is status post ICD insertion back in June 2007.  The  patient continues to walk 2 miles every day and as he states, he does so  365 days a year without fail.  He has had no chest pain or shortness of  breath.  He had otherwise no specific complaints today.   PHYSICAL EXAMINATION:  .  He is a pleasant, well-appearing man in no distress.  The blood pressure was 112/70, the pulse 60 and regular.  Respirations  were 16, and the weight was 190 pounds.  NECK:  No jugular venous distention.  LUNGS:  Clear bilaterally to auscultation.  No wheezes, rales, or  rhonchi are present.  CARDIOVASCULAR:  Regular rate and rhythm with normal S1 and S2.  EXTREMITIES:  No cyanosis, clubbing, or edema.   Interrogation of his defibrillator demonstrates a Medtronic Maximo with  R waves of 11, the impedance 472 ohms, a threshold of 0.5 of 0.2,  battery voltage 3.16 volts.  There are no intercurrent ICD therapies.   MEDICATIONS:  Simvastatin, Lisinopril, aspirin, Lasix, Coreg 6.25 twice  daily, potassium.   IMPRESSION:  1. Ischemic cardiomyopathy.  2. Congestive heart failure presently class I.  3. Status post ICD insertion.   DISCUSSION:  Overall, Mr. Jolley is stable, and his defibrillator is  working normally.  We will see him back in the office for ICD followup  in a year.  He will follow up with [facility] as well.     Doylene Canning. Ladona Ridgel, MD  Electronically Signed    GWT/MedQ  DD: 09/27/2006  DT: 09/27/2006  Job #:  [id number]   cc:   Mosetta Putt, M.D.

## 2010-06-12 NOTE — Cardiovascular Report (Signed)
NAME:  [name] Roberts, [name] Roberts NO.:  [id number]   MEDICAL RECORD NO.:  [id number]          PATIENT TYPE:  OIB   LOCATION:  1962                         FACILITY:  [facility]   PHYSICIAN:  Jonelle Sidle, M.D. LHCDATE OF BIRTH:  02/23/1940   DATE OF PROCEDURE:  03/02/2005  DATE OF DISCHARGE:                              CARDIAC CATHETERIZATION   PRIMARY CARE PHYSICIAN:  Dr. Mosetta Putt   CARDIOLOGIST:  Dr. Diona Browner   INDICATIONS:  [name] Roberts is a 70 year old male with a reported history of  remote myocardial infarction back in 1981 possibly due to vasospasm (little  information is available) who recently developed symptoms suggestive of  congestive heart failure.  He was evaluated with an echocardiogram revealing  an ejection fraction of 25-30% with akinesis of the mid to distal  anteroseptal wall as well as apex and moderate mitral regurgitation.  A  basic medical therapy for cardiomyopathy was initiated and he is now  referred for diagnostic left and right heart catheterization to clearly  outline the coronary anatomy and assess hemodynamics.  The risks and  benefits were explained to the patient in advance and he signed informed  consent.   PROCEDURES PERFORMED:  1.  Left heart catheterization.  2.  Right heart catheterization.  3.  Selective coronary angiography.  4.  Left ventriculography.   ACCESS AND EQUIPMENT:  The area about the right femoral artery and vein was  anesthetized with 1% lidocaine.  A 4-French sheath was placed in the right  femoral artery via the modified Seldinger technique followed by a 7-French  sheath in the right femoral vein via the modified Seldinger technique.  Standard preformed 4-French JL4 and JR4 catheters were used for selective  coronary angiography and an angled pigtail catheter was used for left heart  catheterization, left ventriculography.  These exchanges were made over a  wire.  A standard 7-French balloon-tipped  flow-directed catheter was used  for right heart catheterization without need for a wire.  The patient  tolerated the procedure well without immediate complications.  Total  contrast load was 125 mL of Omnipaque.   HEMODYNAMICS:  Right atrium mean of 5.  Right ventricle 37/5.  Pulmonary  artery 35/15 with a mean of 24.  Pulmonary capillary wedge pressure mean of  17.  Left ventricle 83/32.  Aorta 83/45.  Cardiac output 4.1 by the Fick  method.  Cardiac index 2 by the Fick method.  Arterial saturation 90% on  room air.  Pulmonary artery saturation 60% on room air.  Of note, in the  pulmonary capillary wedge tracing V waves were noted to approximately 25  mmHg.   ANGIOGRAPHIC FINDINGS:  1.  The left main coronary artery is calcified throughout particularly at      the ostium.  This vessel gives rise to the circumflex and left anterior      descending coronary arteries.  The ostium of the left main coronary      artery looks to be stenosed at approximately 60% with a fairly shelf-      like stenosis that is not seen in all views  suggesting either an      eccentric plaque or perhaps even an anatomic variant.  The latter      angiographic views were obtained with the catheter past this area and do      not show a significant stenosis.  There was not significant catheter      damping, although 4-French catheters were used with this study.  2.  Left anterior descending is a medium caliber vessel with one large      diagonal branch and a smaller distal diagonal branch.  The proximal left      anterior descending is diffusely diseased and there is a segment of      approximately 80% stenosis in the proximal to mid portion of the vessel      past the takeoff of the first diagonal branch.  The remainder of the      vessel tapers and is small to medium in caliber.  3.  The circumflex coronary artery also looks to be a medium caliber vessel.      This vessel was essentially subtotally occluded in  the proximal portion.      There is a trace degree of antegrade flow noted.  A large branching      obtuse marginal as well as smaller portion of the distal circumflex      fills predominately by collaterals from the large diagonal branch.  The      left anterior descending and the apical portion of the left anterior      descending.  4.  The right coronary artery is a medium caliber dominant vessel with      posterior descending branch.  There is approximately 30% stenosis in the      mid vessel segment with no other major flow-limiting stenoses.   Left renal artery was performed in the RAO projection, reveals an ejection  fraction of approximately 25% with akinesis to dyskinesis of the anterior  wall and apex.  Associated with this is 1-2+ mitral regurgitation based on  limited views.   DIAGNOSIS:  1.  Two-vessel/left main coronary artery disease as discussed above.  The      absolute severity of the left main lesion is not entirely certain based      on all views.  There looks to be approximately 60% shelf-like stenosis      at the ostium.  However, this could perhaps represent an anatomic      variant.  The latter views that look less significant perhaps in the 30%      range are taken with the catheter beyond this stenosis.  There was not      catheter damping, although a 4-French system was used.  There is      significant calcium in the left anterior descending.  In addition to      this there is 80% diffuse stenosis in the proximal to mid left anterior      descending and a subtotally occluded proximal circumflex coronary artery      that fills partially with collateral flow from the diagonal branch of      left anterior descending and apical left anterior descending.  The right      coronary artery has only mild atherosclerosis noted.  2.  Left ventricular ejection fraction of approximately 25% with anterior      apical akinesis to dyskinesis and 1-2+ mitral regurgitation (V  waves     to approximately 25 mmHg), and a  left ventricular end-diastolic pressure      of 32 mmHg.  3.  Pulmonary artery mean pressure of 24 with pulmonary capillary wedge      pressure mean of 17 and cardiac index 2.   DISCUSSION:  I reviewed the results with the patient, his wife, and with Dr.  Duaine Dredge by phone.  Plan at this time will be to admit the patient to the  hospital and request CVTS consultation.  At this point it is not entirely  certain about the chronicity of the patient's stenoses and whether the  reduction in ejection fraction and focal wall motion abnormalities represent  scarring or perhaps under perfused viable myocardium.  It may be worth  considering a viability study to help sort this out.  The mitral  regurgitation looks to be moderate based on this study and previously noted  echocardiogram.  As stated above, the absolute severity of the left main  lesion is not entirely clear, but does look to be potentially flow-limiting.  Further plans to follow.           ______________________________  Jonelle Sidle, M.D. [facility]     SGM/MEDQ  D:  03/02/2005  T:  03/02/2005  Job:  [id number]   cc:   Mosetta Putt, M.D.  Fax: [phone]

## 2010-06-12 NOTE — Op Note (Signed)
NAMEMCCARTNEY, CHUBA NO.:  [id number]   MEDICAL RECORD NO.:  [id number]          PATIENT TYPE:  INP   LOCATION:  2033                         FACILITY:  [facility]   PHYSICIAN:  Doylene Canning. Ladona Ridgel, M.D.  DATE OF BIRTH:  12/12/1940   DATE OF PROCEDURE:  06/29/2005  DATE OF DISCHARGE:                                 OPERATIVE REPORT   PROCEDURE PERFORMED:  Implantation of a single chamber ICD.   INDICATIONS:  Ischemic cardiomyopathy status post MI now 5 months ago with  severe LV dysfunction, EF 30%, class II heart failure, nonsustained VT in  the setting of mitral valve repair.   INTRODUCTION:  The patient is a 70 year old man who underwent bypass surgery  back in January of 2007.  At that time he had lots of nonsustained VT.  His  repeat echo done in the last month demonstrated persistence of his severe LV  dysfunction with an EF of 25-30%.  He has a history of nonsustained VT and  congestive heart failure and is now referred for ICD implantation.   PROCEDURE:  After informed consent was obtained, the patient taken to  diagnostic EP lab in fasting state.  After usual preparation and draping,  intravenous fentanyl Midazolam was given for sedation.  30 mL of lidocaine  was infiltrated in the left infraclavicular region.  A 7 cm incision was  carried out over this region.  Electrocautery utilized to dissect down the  fascial plane.  10 mL of contrast was injected into the left upper extremity  venous system demonstrating a patent left subclavian vein.  Subsequently  punctured and the Medtronic model [id number] 65-cm active fixation defibrillation  lead, serial number [id number] was advanced into the right ventricle.  Mapping was carried out in the final site near the RV apex.  The R-waves  measured 8 mV and the pacing threshold 0.4 volts at 0.5 milliseconds with  the pacing impedance of 879 ohms with lead actively fixed.  10 volts pacing  did not stimulate the diaphragm.  With  the defibrillation lead in  satisfactory position it was secured to subpectoralis fascia with figure-of-  eight silk suture.  Sewing sleeve was also secured with silk suture.  At  this point electrocautery utilized to make subcutaneous pocket.  Kanamycin  irrigation utilized to irrigate the pocket.  Electrocautery utilized to  assure hemostasis.  The Medtronic Newport VR model 7232 single chamber  defibrillator serial number [id number] was connected to the defibrillation  lead and placed in the subcutaneous pocket.  Generator secured with silk  suture.  Additional kanamycin was utilized to irrigate the pocket and the  defibrillation threshold testing carried out.   After the patient was more deeply sedated with fentanyl and Versed, VF was  induced with T-wave shock and a 15 joules shock was delivered which  terminated VF and restored sinus rhythm.  There were three dropout beats  lead sensitivity.  5 minutes was allowed to elapse and second defibrillation  threshold test carried out.  Again VF was induced with T-wave shock and  again a 15 joules shock was delivered which  terminated ventricular  fibrillation and restored sinus rhythm.  This time with nominal sensitivity  there were no dropouts and no undersensing.  At this point the incision was  closed with a layer of 2-0 Vicryl followed by 3-0 Vicryl followed by layer  of 4-0 Vicryl.  Benzoin was painted on the skin, Steri-Strips were applied,  pressure dressing was placed.  The patient was returned to his room in  satisfactory condition.   COMPLICATIONS:  There were no immediate procedure complications.   RESULTS:  This demonstrates successful implantation of a Medtronic single  chamber defibrillator in a patient with ischemic cardiomyopathy and VT.           ______________________________  Doylene Canning. Ladona Ridgel, M.D.     GWT/MEDQ  D:  06/29/2005  T:  06/30/2005  Job:  [id number]   cc:   Mosetta Putt, M.D.  Fax: [phone]   Jonelle Sidle, M.D. [facility]  [address]  [place]  [place] [address]

## 2010-06-12 NOTE — Op Note (Signed)
NAMEJULEN, [name] Roberts NO.:  [id number]   MEDICAL RECORD NO.:  [id number]          PATIENT TYPE:  INP   LOCATION:  2301                         FACILITY:  [facility]   PHYSICIAN:  Bedelia Person, M.D.        DATE OF BIRTH:  August 31, 1940   DATE OF PROCEDURE:  03/11/2005  DATE OF DISCHARGE:                                 OPERATIVE REPORT   PROCEDURE PERFORMED:  Transesophageal echocardiogram.   ANESTHESIOLOGIST:  Bedelia Person, M.D.   INDICATIONS FOR THE PROCEDURE:  [name] Roberts is a 70 year old male with  coronary artery disease and documented mitral regurgitation on cardiac cath.  The transesophageal echocardiogram will be used intraoperatively to assess  the extent of the left ventricular function as well as evaluation of the  mitral valve for possible repair or replacement.  The patient was quizzed  about esophageal and gastric medical problems, which the states he has none.   DESCRIPTION OF THE PROCEDURE:  The patient was induced with general  anesthesia.  An oral endotracheal tube was used to secure airway.  The  stomach was decompressed with an oral gastric tube, which was then removed.  The transesophageal probe was placed in a sleeve, which was filled with  lubricant.  The sleeve was lubricated and then passed down the oropharynx to  approximately the 45 cm mark.  There it remained throughout the case  obtaining various OmniPlane views.   At the completion of  the case the probe was removed.  There was no evidence  of oral or pharyngeal damage.   FINDINGS:  Pre-bypass examination revealed the left atrium to be of normal  size and appearance.  The appendage was clean.  The intra-atrium septum was  intact.   The left ventricle was markedly dilated.  There was overall hypokinesis with  segmental defects including severe hypokinetic inferior and posterolateral  walls as well as mild-to-moderate hypokinesis of the anterior septal wall.   The mitral valve had two thin  leaflets that opened and closed appropriately.  They appeared to coapt in the valvular plane.  No calcium was noted.   Color Doppler revealed 2+ mitral regurgitation in a central pattern.  No  flow reversal could be documented in either pulmonary vein.   The aortic valve had three leaflets that opened and closed appropriately.  Color Doppler revealed no aortic insufficiency.   Right heart exam noted the Swan-Ganz catheter across the tricuspid valve.  There was mild tricuspid valve insufficiency as well as pulmonic valve  insufficiency.   The patient was placed on cardiopulmonary bypass and underwent coronary  artery bypass grafting as well as placement of a mitral ring.  At completion  of the bypass there were numerous air bubbles, which were quickly cleared  with a left ventricular vent.  The patient was on significant inotropic  support at the termination of bypass.   The left ventricle appeared to be contracting well.  There continued to be  the segmental defects of severe hypokinesis to akinesis of the inferior and  posterolateral walls as well as the milder hypokinesis of the anteroseptal  wall.   The left atrium appeared unchanged in appearance.   The mitral valve now was significantly smaller in diameter.  The anterior  leaflet was exhibiting free motion.  The posterior leaflet was much smaller  with restricted motion.  The valve leaflets did appear to coapt in the  valvular plane.  A ring could be noted in the valvular plane and appeared to  be well-seated.  There were no paravalvular leaks noted.  Color Doppler  revealed no mitral insufficiency.   The aortic valve was unchanged by the bypass.  There was no aortic  insufficiency noted.   The right heart exam initially appeared more dilated after the bypass.  After time from bypass increase this returned to the pre-bypass appearance.           ______________________________  Bedelia Person, M.D.     LK/MEDQ  D:   03/11/2005  T:  03/12/2005  Job:  [id number]

## 2010-06-12 NOTE — Assessment & Plan Note (Signed)
[facility]                           ELECTROPHYSIOLOGY OFFICE NOTE   NAME:[name], [name]                      MRN:          [id number]  DATE:10/12/2005                            DOB:          17-Jul-1940    [name] returns today for followup.  He is a very pleasant, middle-  aged man with an ischemic cardiomyopathy, status post MI with LV dysfunction  and class II heart failure.  He returns today for followup.  He denies chest  pain or shortness of breath and continues to walk on a regular basis.  He  does have a relatively low blood pressure, maintaining systolic pressures in  the 100 range.  He denies peripheral edema.  He denies palpitations and has  had no intercurrent ICD therapies.   EXAM:  GENERAL:  He is a pleasant, well-appearing middle-aged man in no  acute distress.  VITAL SIGNS:  The blood pressure today was 101/56, the pulse 70 and regular,  respirations were 18 and the weight was 182 pounds.  NECK:  No jugular venous distention.  LUNGS:  Clear bilaterally to auscultation.  CARDIOVASCULAR:  Regular rate and rhythm with normal S1 and S2.  EXTREMITIES:  No edema.   There is no EKG.   Interrogation of his defibrillator demonstrates a Medtronic Maxima with R  waves of 11, the pacing impedance of 560 ohms, a threshold of 1 volt at 0.2,  the battery voltage of 3.2 volts.  There were no intercurrent ICD therapies.   IMPRESSION:  1. Ischemic cardiomyopathy.  2. History of nonsustained ventricular tachycardia.  3. Class II heart failure.   DISCUSSION:  Overall, [name] is stable.  His defibrillator is working  normally.  Today, despite his relatively low blood pressure, I have asked  that he try to increase his Coreg by going to 6 mg at nighttime and 3.125 mg  in the morning.  We will hopefully be able to increase this as we go.                                   Doylene Canning. Ladona Ridgel, MD   GWT/MedQ  DD:  10/12/2005  DT:   10/13/2005  Job #:  [id number]   cc:   Mosetta Putt, M.D.

## 2010-06-12 NOTE — Discharge Summary (Signed)
[name], [name] NO.:  [id number]   MEDICAL RECORD NO.:  [id number]          PATIENT TYPE:  INP   LOCATION:  2033                         FACILITY:  [facility]   PHYSICIAN:  Doylene Canning. Ladona Ridgel, M.D.  DATE OF BIRTH:  1940/05/16   DATE OF ADMISSION:  06/29/2005  DATE OF DISCHARGE:  06/30/2005                                 DISCHARGE SUMMARY   No known drug allergies.   PRINCIPAL DIAGNOSES:  1.  Discharging day 1 status post implant Medtronic Maximo cardioverter      defibrillator with a defibrillator threshold study less than or equal to      15 joules.  2.  History of myocardial infarction, coronary artery disease.  3.  Status post coronary artery bypass graft surgery with mitral valve      annuloplasty, February 2007.  4.  Post coronary artery bypass graft nonsustained ventricular tachycardia.  5.  Congestive heart failure class I-II symptoms.  6.  Ischemic cardiomyopathy, ejection fraction 20-30% on repeat 2-D      echocardiogram Jun 10, 2005.   PROCEDURE:  Implantation of Medtronic cardioverter defibrillator with  defibrillator threshhold study, Dr. Lewayne Bunting.   BRIEF HISTORY:  [name] Roberts is a 70 year old male who has a history of  coronary artery disease. He has had an anterior myocardial infarction. He  underwent coronary artery bypass graft surgery with mitral valve  annuloplasty by Dr. Tressie Stalker in February 2007.   At that time, he had decreased left ventricular function and postoperative  nonsustained ventricular tachycardia which was significant. He was seen  initially by Dr. Sharrell Ku. At that time, Dr. Ladona Ridgel recommended waiting  three months and to repeat echocardiogram to see if left ventricular  function had improved after revascularization.   The most recent echocardiogram which was done Jun 10, 2005 shows ejection  fraction 20% to 30% with apical and anteroseptal akinesis as well as  posterior akinesis.   Treatment options have  been discussed with the patient including  implantation of cardioverter defibrillator. The risks, benefits, goals,  expectations of the procedure have been discussed with the patient and he  would like to proceed on an elective basis.   HOSPITAL COURSE:  The patient presented electively June 29, 2005, he  underwent successful implantation of a Medtronic cardioverter defibrillator.  He has had no post procedural complications. He has maintained sinus rhythm.  He is achieving 96% oxygen saturations on room air. His INR is depressed at  1.2, his preoperative Coumadin has been on hold. This was restarted on the  day of surgery and will continue as an outpatient. This incision is healing  nicely without hematoma. The chest x-ray shows that there is no pneumothorax  and the leads are in appropriate position. The device has been interrogated  by the Medtronic technician. Left arm mobility has been discussed with the  patient. He has followup appointments at [facility], [address]   1.  ICD clinic Wednesday, June 20 at 9:20.  2.  To see Dr. Ladona Ridgel Tuesday, September 18 at 9:10.   He was asked  not to lift anything heavier than 10 pounds for the next four  weeks. He is not to drive for the next one week. He is to keep his incision  dry until Tuesday, June 12.   DISCHARGE MEDICATIONS:  1.  Darvocet-N 100 1-2 tabs every 3-4 h as needed for pain, this is new.  2.  Simvastatin 20 mg daily at bedtime.  3.  Lisinopril 10 mg daily.  4.  Enteric coated aspirin 81 mg daily.  5.  Coreg 3.125 mg twice daily.  6.  K-Dur 10 mEq daily.  7.  Furosemide 20 mEq daily.  8.  Coumadin 10 mg Monday, Wednesday, Thursday, Friday, Sunday; 12.5 mg      Tuesday, Saturday.   PERTINENT LABORATORY DATA:  On May 31, his pro time was 21.6, INR is 2.8.  This was placed on hold for 2 days prior to the procedure. Serum  electrolytes, sodium 139, potassium 4.2, chloride 102, carbonate 29, glucose  138. BUN  is 18, creatinine 1.3. Complete blood count on May 31, white cells  7.1, hemoglobin 12.9, hematocrit 37.3 and platelets 232. PTT was 40.      Maple Mirza, P.A.    ______________________________  Doylene Canning. Ladona Ridgel, M.D.    GM/MEDQ  D:  06/30/2005  T:  06/30/2005  Job:  [id number]   cc:   Doylene Canning. Ladona Ridgel, M.D.  [address]  [place]  [place] [address]   Mosetta Putt, M.D.  Fax: [phone]

## 2010-06-12 NOTE — Discharge Summary (Signed)
[name], Roberts NO.:  [id number]   MEDICAL RECORD NO.:  [id number]          PATIENT TYPE:  INP   LOCATION:  2009                         FACILITY:  [facility]   PHYSICIAN:  Salvatore Decent. Cornelius Moras, M.D. DATE OF BIRTH:  Jun 10, 1940   DATE OF ADMISSION:  03/11/2005  DATE OF DISCHARGE:                                 DISCHARGE SUMMARY   PRIMARY DIAGNOSES:  1.  Severe two-vessel coronary artery disease.  2.  Ischemic cardiomyopathy and moderate mitral regurgitation.   IN-HOSPITAL DIAGNOSES:  1.  Hypotension.  2.  Acute blood loss anemia postoperatively.  3.  Volume overload postoperatively.  4.  Acute renal insufficiency.  5.  Nonsustained ventricular tachycardia.   SECONDARY DIAGNOSES:  1.  History of myocardial infarction dating back to 26.  2.  Hyperlipidemia.  3.  Recent history of pneumonia in December of 2006.  4.  Status post appendectomy.  5.  Hypertension.   ALLERGIES:  NO KNOWN DRUG ALLERGIES.   OPERATIONS AND PROCEDURES:  1.  Coronary artery bypass graft x3 using a left internal mammary artery to      the distal left anterior descending coronary artery, saphenous vein      graft to the diagonal branch, saphenous vein graft to the circumflex      marginal branch, endoscopic vein harvesting from the right thigh.  2.  Mitral valve annuloplasty using a 26-mm Edwards Carpentier-McCarthy-      Adams IMR ETlogic ring annuloplasty.  3.  Intraoperative transesophageal echocardiogram.   HISTORY OF PRESENT ILLNESS AND HOSPITAL COURSE:  The patient is a 70-year-  old Caucasian male with known history of coronary artery disease,  hypertension, hyperlipidemia, and remote history of tobacco use.  The  patient's cardiac history dates back to 70 at which time he suffered an  acute myocardial infarction.  At that time, the patient was medically  treated.  In December of 2006 and in January of 2007, the patient developed  severe symptoms of shortness of breath,  initially developed in the setting  of febrile illness but persisted after treatment and found to be consistent  with congestive heart failure.  The patient underwent echocardiogram which  demonstrated moderate-to-severe left ventricular dysfunction with an  ejection fraction of 25% and evidence of pulmonary hypertension and moderate  mitral regurgitation.  The patient then underwent a stress Cardiolite exam  that was abnormal.  He was then taken by Dr. Diona Browner on March 02, 2005  for a cardiac catheterization.  This showed severe two-vessel coronary  artery disease with ischemic cardiomyopathy and moderate mitral  regurgitation.  Following that, Dr. Cornelius Moras was consulted.  The patient was  seen and evaluated by Dr. Cornelius Moras.  Dr. Cornelius Moras discussed the patient undergoing  coronary artery bypass grafting as well as mitral valve repair.  He  discussed the risks and benefits of this procedure.  The patient  acknowledged understanding and agreed to proceed.  The surgery was scheduled  for March 11, 2005.  The patient was discharged home following  catheterization and returned on March 11, 2005 for surgery.   The patient was taken to  the operating room on March 11, 2005 where he  underwent coronary artery bypass grafting x3 using a left internal mammary  artery to the distal left anterior descending coronary artery, saphenous  vein graft to the diagonal branch, saphenous vein graft to the circumflex  marginal branch, and endoscopic saphenous vein harvesting from the right  thigh was done.  The patient also had a mitral valve annuloplasty done using  a 26-mm Edwards Carpentier-Edwards-Adams IMR ETlogic ring annuloplasty done.  The patient tolerated the procedure well and was transferred up to the  intensive care unit in stable condition.  The patient was extubated the  evening/early morning following surgery.  Following extubation, the patient  was seen to be alert and oriented x3,  neurologically intact.   Immediately following surgery, the patient was seen to be hypotensive.  He  was continued on Neo-Synephrine and dopamine.  Over the next several days,  Neo-Synephrine was able to be weaned off.  The patient required dopamine  drip slightly longer.  Finally on postoperative day #4, the dopamine drip  was able to be weaned off.  Blood pressure remained stable, with systolic  stabilized at greater than 70.   During the patient's postoperative course, he developed acute blood loss  anemia.  Hemoglobin and hematocrit dropped to 8.3 and 24. The patient did  receive packed red blood cells.  His hemoglobin and hematocrit were  monitored during his hospital course.  The patient was started on Niferex on  postoperative day #5.  He was asymptomatic from the low hemoglobin and  hematocrit, and it had stabilized prior to discharge home.  Last hemoglobin  and hematocrit were noted to be 8.6 and 24.7.   Also during the patient's postoperative course, he had a run of nonsustained  VT on postoperative day #4.  Following this episode, Dr. Ladona Ridgel was  consulted for evaluation.  Following evaluation, Dr. Ladona Ridgel felt that Mr.  Fauver was not a candidate for ICD implantation.  He felt that if the  patient were to develop sustained monomorphic VT or V fib in his  postoperative course that defibrillator therapy would be warranted.  He felt  that the question is whether his left ventricular function would improve or  not post-revascularization.  Dr. Ladona Ridgel felt that he could follow up with  the patient as an outpatient, with a repeat 2-D echocardiogram done in about  two to three months.  He felt that if his LV function remained impaired with  an EF of less than 35%, then primary prevention ICD would certainly be  warranted.  Again, he will plan to follow up with the patient once  discharged home.   Also during the patient's postoperative course, he developed volume overload.  The  patient was started on diuretics.  On postoperative day #6,  the patient's weight was still 12 pounds above his preoperative weight,  weighing at 196 where his postoperative weight was 184.  He will be  continued on diuretics at discharge to home.   The patient also developed acute renal insufficiency postoperatively.  This  was monitored during his hospital course.  His Lasix dose was decreased to  daily instead of b.i.d.  This was monitored and will be rechecked in the  morning of March 18, 2005.  Currently, creatinine is at 1.7.   The patient was started on Coumadin postoperatively for the mitral valve  annuloplasty.  Last INR was seen to be 1.6 on postoperative day #6.  His  Coumadin dosing  will be managed by Dr. Diona Browner as an outpatient.   The remainder of the patient's postoperative course was pretty much  unremarkable.  Chest tube and lines were discontinued in the normal fashion.  He was out of bed and ambulating well.  His incisions were dry and intact  and healing well.  The patient was in normal sinus rhythm prior to discharge  home.  He was able to be weaned off of oxygen, saturating greater than 90%  on room air.  The patient was tolerating a regular diet well, with no nausea  or vomiting noted.  His bowel movements were within normal limits.  The  patient was clear to auscultation bilaterally on physical exam.   DISPOSITION:  The patient is tentatively ready for discharge home on  postoperative day #7, March 18, 2005.   FOLLOW UP:  A followup appointment has been arranged with Dr. Cornelius Moras for April 12, 2005 at 11:15 a.m.  A followup appointment has been arranged with Dr.  Diona Browner on April 07, 2005 at 4:15 p.m.  At this appointment, the patient  will obtain a PA and lateral chest x-ray which he will then bring with him  to Dr. Orvan July appointment.  An echocardiogram has been scheduled for Jun 10, 2005 at 9:30 a.m.  Following echocardiogram, the patient is to follow  up  with Dr. Ladona Ridgel.  The patient will need to have PT/INR blood work done at  [facility] on March 22, 2005.   DISCHARGE INSTRUCTIONS:  [name] received instructions on diet,  activity level, and incisional care.  He was told no driving until released  to do so, no heavy lifting over 10 pounds.  The patient was told to ambulate  three to four times per day, progress as tolerated.  He was told he is  allowed to shower, washing his incisions using soap and water.  He is to  contact the office if he develops and drainage or opening from any of his  incisions sites.  The patient was also told to continue his breathing  exercises.  He was educated on diet to be low fat, low salt.  The patient  acknowledged his understanding.   DISCHARGE MEDICATIONS:  1.  Aspirin 81 mg daily.  2.  Coreg 3.125 mg b.i.d.  3.  Zocor 10 mg daily.  4.  Tylox one to two tablets q.4-6h. p.r.n. pain.  5.  Lasix 40 mg daily x7 days.  6.  Potassium chloride 20 mEq daily x7 days. 7.  Niferex 150 mg daily.  8.  Coumadin.  Dose will be based on the patient's discharge PT/INR levels.      Again, Coumadin dosing once discharged home will be managed by Dr.      Diona Browner.  9.  Lisinopril 10 mg daily.      Theda Belfast, PA      Salvatore Decent. Cornelius Moras, M.D.  Electronically Signed    KMD/MEDQ  D:  03/17/2005  T:  03/18/2005  Job:  [id number]   cc:   Salvatore Decent. Cornelius Moras, M.D.  [address]  [place]  [place] [address]   Jonelle Sidle, M.D. [facility]  [address]  [place]  [place] [address]   Doylene Canning. Ladona Ridgel, M.D.  [address]  [address]  [place]  [place] [address]

## 2010-06-12 NOTE — Op Note (Signed)
NAMEFLOR, HOUDESHELL NO.:  [id number]   MEDICAL RECORD NO.:  [id number]          PATIENT TYPE:  INP   LOCATION:  2301                         FACILITY:  [facility]   PHYSICIAN:  Salvatore Decent. Cornelius Moras, M.D. DATE OF BIRTH:  [birth year]   DATE OF PROCEDURE:  03/11/2005  DATE OF DISCHARGE:                                 OPERATIVE REPORT   PREOPERATIVE DIAGNOSIS:  Severe two-vessel coronary artery disease with  ischemic cardiomyopathy and moderate mitral regurgitation.   POSTOPERATIVE DIAGNOSIS:  Severe two-vessel coronary artery disease with  ischemic cardiomyopathy and moderate mitral regurgitation.   PROCEDURE:  Median sternotomy for coronary artery bypass grafting x3 (left  internal mammary artery to distal left anterior descending coronary artery,  saphenous vein graft to diagonal branch, saphenous vein graft to circumflex  marginal branch, endoscopic saphenous vein harvest from right thigh) and  mitral valve annuloplasty (26 mm Edwards-McCarthy-Adams IMR ETlogix Ring  Annuloplasty)   SURGEON:  Dr. Purcell Nails.   ASSISTANT:  Jerold Coombe, P.A.   ANESTHESIA:  General.   CLINICAL NOTE:  The patient is a 70 year old male with known history of  coronary artery disease, hypertension, hyperlipidemia, and remote history of  tobacco use. The patient's cardiac history dates back in 1981 at which time  he suffered an acute myocardial infarction. He has been treated medically  since that time. In December 2006 and January2007 the patient developed  severe symptoms of shortness of breath, initially developed in the setting  of a febrile illness but persisted after treatment and were found to be  consistent with congestive heart failure. The patient underwent an  echocardiogram demonstrating moderate to severe left ventricular dysfunction  with ejection fraction 25% and evidence for pulmonary hypertension and  moderate mitral regurgitation. The patient underwent a  stress Cardiolite  exam that was abnormal and subsequently he underwent left and right heart  catheterization by Dr. Simona Huh on February06,2007. He was found to have  severe two-vessel coronary artery disease with ischemic cardiomyopathy,  moderate mitral regurgitation. Cardiac surgical consultation was requested  at that time.   OPERATIVE CONSENT:  The patient and his family have been counseled at length  regarding the indications and potential benefits of coronary artery bypass  grafting with possible mitral valve repair. Alternative treatment strategies  have been discussed. The patient understands the somewhat increased risks of  surgery due to the degree of significant left ventricular dysfunction. He  understands and accepts all associated risks including but not limited to  risk of death, stroke, myocardial infarction, congestive heart failure,  respiratory failure, pneumonia, bleeding requiring blood transfusion,  arrhythmia, infection, and recurrent coronary artery disease. All of his  questions have been addressed.   OPERATIVE FINDINGS:  1.  Ischemic cardiomyopathy with ejection fraction estimated 25-30%      including severe anterior apical akinesis and severe posterolateral      akinesis and moderate global left ventricular dysfunction.  2.  Moderate (2+) mitral regurgitation due to a combination of type I      annular dilatation and type IIIB restriction of the posterior leaflet of  the mitral valve.  3.  Good-quality distal target vessels and conduit for grafting.  4.  No residual mitral regurgitation after mitral valve annuloplasty.   OPERATIVE NOTE IN DETAIL:  The patient was brought to the operating room on  the above-mentioned date and central monitoring was established by the  anesthesia service under the care and direction of Dr. Bedelia Person.  Specifically, a Swan-Ganz catheter is placed through the right internal  jugular approach. A radial arterial line is  placed. Intravenous antibiotics  are administered. Following induction with general endotracheal anesthesia,  Foley catheter is placed. The patient's chest, abdomen, both groins, and  both lower extremities are prepared and draped in sterile manner.   Baseline transesophageal echocardiogram is performed by Dr. Gypsy Balsam. This  demonstrates ischemic cardiomyopathy with ejection fraction 25-30%. Left  ventricle is dilated. There is akinesis of the distal anterior wall and  apex. There is akinesis of the posterolateral wall. There is moderate (2+)  mitral regurgitation. No other abnormalities are noted. Severity of the  mitral regurgitation does vary somewhat with changes in loading conditions.   A median sternotomy incision is performed and the left internal mammary  artery is dissected from the chest wall and prepared for bypass grafting.  The left internal mammary artery is good-quality conduit. Simultaneously  saphenous vein is obtained from the patient's right thigh using endoscopic  vein harvest technique through a small incision made just above the right  knee. The saphenous vein is good-quality conduit. After the saphenous vein  is removed from the thigh, the small surgical incision is closed in multiple  layers with running absorbable suture.   The patient is heparinized systemically. The pericardium is opened. The  ascending aorta is normal in appearance. The ascending aorta is cannulated  for cardiopulmonary bypass. A venous cannula is placed in the superior vena  cava. A second venous cannula is placed low in the right atrium with tip  extending down the inferior vena cava. Cardiopulmonary bypass is begun.  Vessel loops are placed around the superior vena cava and inferior vena  cava. Distal sites are selected for coronary bypass grafting. There is  evidence of old transmural infarction involving the distal anterior wall and apex as well as the posterolateral wall of the left  ventricle. Left  ventricle is dilated. Portions of saphenous vein and the left internal  mammary artery are trimmed to appropriate lengths. A temperature probe is  placed left ventricular septum. A retrograde cardioplegic catheter is placed  into the coronary sinus. An antegrade cardioplegic catheter is placed in the  ascending aorta.   The patient is cooled to 32 degrees systemic temperature. The aortic  crossclamp is applied and cold blood cardioplegia is administered initially  in antegrade fashion through the aortic root. Iced saline slush is applied  for topical hypothermia. The initial cardioplegic arrest and myocardial  cooling are felt to be excellent. Supplemental cardioplegia is administered  retrograde through the coronary sinus catheter. Repeat doses of cardioplegia  are administered intermittently throughout the crossclamp portion of the  operation through the aortic root, down the subsequently placed vein grafts,  and retrograde through the coronary sinus catheter to maintain left  ventricular septal temperature below 15 degrees centigrade.   The following distal coronary anastomoses are performed:  1.  The circumflex marginal branch is grafted with a saphenous vein graft in      end-to-side fashion. This vessel measures 2 mm in diameter and is fair  to good quality at the site of distal bypass.  2.  The diagonal branch off the left anterior descending coronary artery is      grafted with a saphenous vein graft in end-to-side fashion. This vessel      measures 1.2 mm in diameter and is a fair to poor quality target.  3.  The distal left anterior descending coronary artery is grafted with the      left internal mammary artery in end-to-side fashion. This vessel      measures 1.7 mm in diameter and is good quality at the site of distal      bypass.   The left atriotomy incision is performed posteriorly through the interatrial  groove. The mitral valve is exposed using a  self-retaining retractor.  Exposure is felt to be excellent. The mitral valve is carefully examined.  There is annular dilatation of the valve. One can appreciate that the  posterior lateral papillary muscle has been recently infarcted and there  appears to be secondary functional restriction of the posterior leaflet.  There is no mitral valve prolapse. There is minimal calcification. Ring  annuloplasty is performed using interrupted to 2-0 Ethibond horizontal  mattress sutures placed circumferentially around the mitral valve annulus.  Initially the valve is sized and is felt that a 28 mm annuloplasty ring will  be sufficient. The surface area of the anterior leaf of the mitral valve  appears to be between a 26 and 28 mm sizer, probably closer to a 28 mm  sizer. A 28 mm Edwards-McCarthy-Adams ETlogix annuloplasty ring is lowered  in place but before the sutures are tied, the valve is tested for competence it appears that the size of the ring is probably a little bit big, given the  underlying pathophysiology. The 28 mm ring is removed and 26 mm ring is  inserted and the sutures are tied. However, after the 26 mm ring is secured  in place, the valve does not appear to be perfectly competent and this  appears to be due to asymmetric placement of the ring rather than some  underlying valve pathology. Therefore the ring was cut out and removed and  the annulus was reevaluated after all the sutures had been removed. Ring  annuloplasty is then repeated using interrupted 2-0 Ethibond horizontal  mattress sutures placed circumferentially again. Another 26 mm annuloplasty  ring is secured in place (model number 4100, serial number [id number]). After  this annuloplasty ring is secured in place, the valve appears perfectly  competent with excellent symmetrical closure line of the leaflets and no  residual mitral regurgitation. Rewarming is begun.   The left atrial appendage is oversewn from within the  left atrium using a  two-layer closure of running 3-0 Prolene suture. The left atriotomy is  closed using a two-layer closure of running 3-0 Prolene suture. Both  proximal saphenous vein anastomoses are performed directly to the ascending  aorta prior to removal of the aortic crossclamp. The patient is placed in  Trendelenburg position. One final dose of warm retrograde hot shot  cardioplegia is administered. The left internal mammary artery is  reperfused. Left ventricular septal temperature rises rapidly. Aortic  crossclamp is removed after a total crossclamp time of 181 minutes.   The heart begins to beat spontaneously without need for cardioversion. All  proximal and distal coronary anastomoses are inspected for hemostasis and  appropriate graft orientation. Epicardial pacing wires are fixed to the  right ventricular outflow tract into the right  atrial appendage. A McGoon  needle is placed in the ascending aorta to facilitate evacuation of any  residual air.  The IVC cannula is removed and its cannulation site oversewn  with Prolene suture.   The patient is weaned from cardiopulmonary bypass without difficulty. The  patient's rhythm at separation from bypass is sinus rhythm with second-  degree AV block. AV sequential pacing is employed. Normal sinus rhythm  resumes spontaneously prior to completion the operation. Total  cardiopulmonary bypass time for the operation is 202 minutes. The patient is  weaned from bypass on low dose milrinone and dopamine infusion.   Follow-up transesophageal echocardiogram performed by Dr. Gypsy Balsam after  separation from bypass demonstrates no significant changes and left  ventricular function. The mitral valve annuloplasty ring is well seated the  mitral valve is functioning normally with no residual mitral regurgitation.   The SVC cannula is removed uneventfully. The aortic root vent is removed. The aortic cannula is removed and protamine is  administered to reverse the  anticoagulation. The mediastinum and the left chest are irrigated with  saline solution containing vancomycin. Meticulous surgical hemostasis is  ascertained. The mediastinum and both left and right pleural spaces are  drained using four chest tubes exited through separate stab incisions  inferiorly. The median sternotomy is closed using single strength sternal  wire. Soft tissues anterior to the sternum are closed in multiple layers and  the skin is closed with a running subcuticular skin closure.   The patient tolerated the procedure well and is transported to the surgical  intensive care unit in stable condition. There are no intraoperative  complications. All sponge, instrument and needle counts are verified correct  at completion of the operation. No blood products were administered.      Salvatore Decent. Cornelius Moras, M.D.  Electronically Signed     CHO/MEDQ  D:  03/11/2005  T:  03/12/2005  Job:  [id number]   cc:   Jonelle Sidle, M.D. [facility]  [address]  [place]  [place] [address]   Mosetta Putt, M.D.  Fax: [phone]

## 2010-06-12 NOTE — Assessment & Plan Note (Signed)
[facility]                            CARDIOLOGY OFFICE NOTE   [name], [name]                      MRN:          [id number]  DATE:02/08/2006                            DOB:          24-Oct-1940    PRIMARY CARE PHYSICIAN:  Mosetta Putt, M.D.   REASON FOR VISIT:  Cardiac followup.   HISTORY OF PRESENT ILLNESS:  I saw [name] back in July 2006.  He  continues to do quite well with known ischemic cardiomyopathy status  post previous coronary artery bypass grafting, concomitant mitral valve  annuloplasty, and subsequent placement of a single-chamber Medtronic  defibrillator due to nonsustained ventricular tachycardia.  He has had  no device discharges and denies any problems with exertional angina,  dyspnea on exertion, palpitations, orthopnea, PND, or lower extremity  edema.  His medications were reviewed and listed below.  I note that his  Coreg and simvastatin have both been increased in the interim.  He tells  me that his LDL cholesterol was recently around 70.  Electrocardiogram  today shows sinus bradycardia with biatrial enlargement, decreased  anterior R wave progression, and otherwise nonspecific ST-T wave  changes.   ALLERGIES:  No known drug allergies.   PRESENT MEDICATIONS:  1. Zocor 40 mg p.o. daily.  2. Lisinopril 10 mg p.o. daily.  3. Aspirin 81 mg p.o. daily.  4. Lasix 20 mg p.o. daily.  5. Coreg 6.25 mg p.o. b.i.d.  6. Klor-Con 10 mEq p.o. daily.  7. Advil p.r.n.   REVIEW OF SYSTEMS:  As described in the history of present illness.  All  else negative.   EXAMINATION:  VITAL SIGNS:  Blood pressure 106/62, heart rate is 63,  weight is 187 pounds which is up from 179 in July.  GENERAL:  The patient is comfortable and in no acute distress.  NECK:  Reveals no elevated jugular venous pressure or loud bruits.  No  thyromegaly was noted.  LUNGS:  Clear without labored breathing.  CARDIAC:  Reveals a regular rate and  rhythm without loud murmur or S3  gallop.  ABDOMEN:  Soft, no bruits, bowel sounds present.  EXTREMITIES:  Show no significant pitting edema.   IMPRESSION AND RECOMMENDATIONS:  1. Ischemic cardiomyopathy with an ejection fraction of 25-30% status      post revascularization surgery, mitral valve annuloplasty, and      placement of a single-chamber defibrillator.  He continues to do      well and is stable symptomatically on the present medical regimen.      I have not made any adjustments today and will plan to see him back      for symptom review over the next 6 months.  2. History of hyperlipidemia, followed by Dr. Duaine Dredge.  It sounds as      if his LDL control is optimal, in the 70 range.     Jonelle Sidle, MD  Electronically Signed    SGM/MedQ  DD: 02/08/2006  DT: 02/08/2006  Job #: [id number]   cc:   Mosetta Putt, M.D.

## 2010-06-12 NOTE — Consult Note (Signed)
NAMETYRECE, VANTERPOOL NO.:  [id number]   MEDICAL RECORD NO.:  [id number]          PATIENT TYPE:  INP   LOCATION:  2301                         FACILITY:  [facility]   PHYSICIAN:  Doylene Canning. Ladona Ridgel, M.D.  DATE OF BIRTH:  [birth year]   DATE OF CONSULTATION:  03/15/2005  DATE OF DISCHARGE:                                   CONSULTATION   ELECTROPHYSIOLOGIC CONSULTATION NOTE   CONSULTATION IS REQUESTED BY:  Dr. Tressie Stalker.   INDICATION FOR CONSULTATION:  Evaluation of nonsustained VT post bypass  surgery with mitral valve repair.   HISTORY OF PRESENT ILLNESS:  The patient is a very pleasant 70 year old man  with a history of remote myocardial infarction 25 years ago.  He had done  well initially until December of this year when he developed increasing  symptoms of shortness of breath.  This occurred after a febrile illness.  He  was initially thought to have had pneumonia, but subsequently determined to  have congestive heart failure.  An echocardiogram demonstrated an EF of 25%  and moderate mitral regurg with some pulmonary hypertension.  He underwent  left and right heart catheterization back on February 6, which demonstrated  severe 2-vessel coronary artery disease, moderate mitral regurgitation and  he was referred for a cardiac surgery evaluation.  The patient subsequently  underwent a 3-vessel bypass surgery with a LIMA to the LAD, saphenous vein  graft to a diagonal and saphenous vein graft to a circumflex marginal branch  with mitral valve annuloplasty done at that time.  His postoperative course  has been fairly unremarkable except for mild hypotension and underlying  atrial pacing secondary to sinus bradycardia.  The patient has had  nonsustained VT in his postoperative course though this has been  asymptomatic.  Review of his strips demonstrated a wide QRS tachycardia  lasting for approximately 3-4 second (13 beats).  He has also had frequent  atrial and  occasional ventricular paced events.  He is referred for  additional evaluation.  The patient denies a history of syncope, otherwise  his health has been good.  His medications on admission to the hospital  included aspirin, lisinopril, Lasix, Zocor and Coreg.   PAST MEDICAL HISTORY:  Notable for dyslipidemia.   SOCIAL HISTORY:  The patient is married with 2 children.  He has a history  of tobacco, but stopped smoking in 1999.  He occasionally drinks alcoholic  beverages.  He works as a Mining engineer.  He walked  on a regular basis until December.   FAMILY HISTORY:  Notable for father, who died at age 48 of complications of  heart failure.   REVIEW OF SYSTEMS:  Negative for vision or hearing problems although he does  wear glasses for visual acuity.  He denies nausea, vomiting, diarrhea,  constipation, polyuria, polydipsia, heat or cold intolerance, recent weight  changes, skin problems, arthritic complaints, or cough or claudication.  He  does have PND, orthopnea and dyspnea on exertion.  The rest of his review of  systems were reviewed and found to be negative.   PHYSICAL EXAMINATION:  VITAL SIGNS:  The blood pressure was 90/50, the pulse  was 80 and regular, respirations were 18 and temperature was 98.7.  GENERAL:  He is a pleasant well appearing 70 year old man in no acute  distress.  HEENT:  Normocephalic and atraumatic.  Pupils equal and round.  The  oropharynx is moist.  The sclerae are anicteric.  NECK:  Revealed no jugular venous distention.  There was no thyromegaly.  The trachea was midline and carotids were 2+ and symmetric.  LUNGS:  Clear bilaterally except for rales in the bases bilaterally.  There  are no wheezes, rales or rhonchi.  CARDIOVASCULAR EXAM:  Somewhat distant with a regular rate and rhythm with  normal S1 and S2.  There were no murmurs, rubs or gallops heard.  ABDOMEN EXAM:  Soft, nontender, nondistended, there is no organomegaly  and  bowel sounds are present.  EXTREMITIES:  Warm with no cyanosis, clubbing or edema.  The pulses were 2+  and symmetric.  NEUROLOGIC EXAM:  Alert and oriented x3 with cranial nerves intact.  The  strength was 5/5 and symmetric.   The EKG post surgery demonstrates atrial pacing with a narrow QRS.   IMPRESSION:  1.  Ischemic cardiomyopathy.  2.  Congestive heart failure, previously class II to III.  3.  Status post bypass surgery with mitral valve repair.  4.  Dyslipidemia.  5.  Postoperative bradycardia with atrial pacing.   DISCUSSION:  I have discussed the issues with regard to defibrillator  therapy with Mr. Muccio.  With his nonsustained VT  and recent bypass  surgery despite his probable scar, he is not at the moment a candidate for  ICD implantation.  If this patient were to develop sustained monomorphic VT  or VF in his postoperative course, of course defibrillator therapy would be  warranted.  The real issue is whether his LV function will improve or not  post revascularization.  Because he has a scar from many years back, the  likelihood is that it will not significantly although there certainly is a  chance that surviving and surrounding myocardium will.  For this reason, if  his postoperative course remains stable, I would recommend this to be  continued as usual.  He will undergo a repeat 2-D echo in 2-3 months.  If  his LV function remains impaired with an EF of less than 35%, then primary  prevention ICD would certainly be warranted at that time.  If prior to  discharge from the hospital, the patient were to develop recurrent and  sustained ventricular arrhythmias or were to require bradycardia backup  pacing, then an EP study would be warranted to make sure that he did not  have inducible VT, at which time we would consider placing a defibrillator  rather than pacemaker.           ______________________________  Doylene Canning. Ladona Ridgel, M.D.    GWT/MEDQ  D:   03/15/2005  T:  03/15/2005  Job:  [id number]   cc:   Jonelle Sidle, M.D. [facility]  [address]  [place]  [place] [address]   Mosetta Putt, M.D.  Fax: [phone]

## 2010-06-12 NOTE — H&P (Signed)
NAMECORTAVIOUS, NIX NO.:  [id number]   MEDICAL RECORD NO.:  [id number]          PATIENT TYPE:  OIB   LOCATION:  1962                         FACILITY:  [facility]   PHYSICIAN:  Jonelle Sidle, M.D. LHCDATE OF BIRTH:  [birth year]   DATE OF ADMISSION:  03/02/2005  DATE OF DISCHARGE:                                HISTORY & PHYSICAL   PRIMARY CARE PHYSICIAN:  Mosetta Putt, M.D.   CARDIOLOGIST:  Jonelle Sidle, M.D. [facility]   REASON FOR ADMISSION:  Cardiomyopathy with recently diagnosed two-  vessel/left main coronary artery disease.   HISTORY OF PRESENT ILLNESS:  Mr. Ballin is a 70 year old male referred to  me initially by Dr. Duaine Dredge back in January 2007.  The patient's history  includes reported myocardial infarction back in 1981, apparently in the  setting of vasospasm (details not available) as well as apparently recently  diagnosed mild hyperlipidemia.  He developed symptoms of shortness of breath  also in the setting of fevers, chills, and myalgias and evidence of  pneumonia by chest x-ray back in December 2006.  He was managed with  antibiotics and eventually his symptoms improved.  Given some degree of  persistent shortness of breath, he was however referred for further  assessment and underwent a 2D echocardiogram through our office, on February 12, 2005, which revealed a left ventricular ejection fraction of 25-30% with  akinesis of the mid to distal anteroseptal wall as well as periapical wall,  hypokinesis of the posterolateral wall, and moderate mitral regurgitation  with mildly increased pulmonary artery systolic pressures.  He was initiated  on a basic medical regimen for cardiomyopathy and referred for left and  right heart catheterization.   This procedure was performed today (please see full dictated report).  In  summary, he is noted to have left main/two-vessel coronary artery disease  effecting the left coronary system with an  elevated left ventricular end  diastolic pressure of 32-mmHg, cardiac index of 2.0, V waves to 25-mmHg  with evidence of 1 to 2+ mitral regurgitation at angiography, and a left  ventricular ejection fraction of 25% with akinesis to dyskinesis of the  anterior apical wall.  Based on this, he is now admitted to the hospital  with request for CVTS consultation.   ALLERGIES:  No known drug allergies.   PRESENT MEDICATIONS:  1.  Enteric coated aspirin 81 mg p.o. every day.  2.  Lisinopril 10 mg p.o. every day.  3.  Lasix 20 mg p.o. every day.  4.  Zocor 10 mg p.o. every day.  5.  Coreg 3.125 mg p.o. b.i.d.   PAST MEDICAL HISTORY:  As outlined in the history of present illness.  In  addition, the patient is:  1.  Status post appendectomy in 1970.  2.  There is no clearly documented history of hypertension or type 2      diabetes mellitus.   SOCIAL HISTORY:  The patient is married and has two children.  He has a  prior history of tobacco use but quit back in 1999.  He drinks alcohol  occasionally.  He  works as a Museum/gallery conservator rooms and windows.  He has  a prior history of walking two miles a day but has not done this  consistently with his shortness of breath of late.   FAMILY HISTORY:  Significant for heart failure in the patient's father who  died at age 53.   REVIEW OF SYSTEMS:  As described in the history of present illness.  He has  had no recent cough, fevers, chills, orthopnea, PND, or lower extremity  edema.  He denies any palpitations and syncope.  He has had no obvious  bleeding problems.  Systems otherwise negative.   PHYSICAL EXAMINATION:  VITAL SIGNS:  Systolic blood pressure recently  running in the 90 to 100 range, heart rate in the 70s to 80s in sinus  rhythm, oxygen saturation in the low 90s on room air.  GENERAL:  This is a well nourished, well developed male in no acute  respiratory distress.  NECK:  Reveals no marks, jugular venous distention, or loud  bruits.  LUNGS:  Essentially clear with a few rales at the bases.  No labored  breathing at rest and no wheezing is noted.  CARDIAC:  Reveals a regular rate and rhythm with indistinct PMI.  There is  no loud S3 gallop.  There is a very soft apical systolic murmur noted.  ABDOMEN:  Soft with no __________  hepatomegaly and no bruits.  EXTREMITIES:  Show no significant pitting edema.  Distal pulses are 1 to 2+.  MUSCULOSKELETAL:  No kyphosis is noted.  NEURO/PSYCHIATRIC:  The patient is alert and oriented x3.  Affect is normal.   RECENT LABORATORY DATA:  WBC 8.7, hemoglobin 13.1, hematocrit 38.9,  platelets 277.  INR 0.9.  Sodium 138, potassium 4.8, glucose 100, BUN 14,  creatinine 1.3.   Chest x-ray report, from January 19, 2005, at the time of clinical  pneumonia, revealed bilateral pneumonia with alveolar infiltrates left  greater than right without pleural effusions.   Recent 12-lead electrocardiogram shows sinus rhythm with poor anterior R  wave progression suggesting possible anteroseptal myocardial infarction,  perhaps old.  Nonspecific ST-T wave changes are also noted.   IMPRESSION:  1.  Cardiomyopathy, likely ischemic in etiology, with recently diagnosed two-      vessel/left main coronary artery disease documented at catheterization.      The patient reports a prior history of myocardial infarction in 1981,      presumably due to vasospasm based on his description but no firm details      are available at this time.  He has not undergone any prior percutaneous      intervention as best I can tell.  2.  Clinical congestive heart failure diagnosed in the setting of pneumonia      back in December 2006.  The patient has improved symptomatically and has      been initiated on a basic medical regimen within the last few weeks.  3.  Hyperlipidemia, recently placed on Zocor.  4.  No clearly documented history of hypertension or type 2 diabetes      mellitus.   PLAN: 1.  Having  reviewed the situation with the patient, his wife, and with Dr.      Duaine Dredge, Mr. Vieth will be admitted to the hospital for further      evaluation.  I have requested a CVTS consultation.  2.  What is not entirely certain is the chronicity of the patient's lesions.  Neither is it certain whether he has had reduced left ventricular      function with wall motion abnormalities due to scarring from myocardial      infarction or perhaps hypo contractile myocardium due to under perfusion      but still representing viable segments.  The electrocardiogram would      argue more for scar.  3.  We will repeat chest x-ray to ensure improvement in previously noted      infiltrates.  4.  May need to consider a viability study to help determine next step in      revascularization.  It looks that most      likely percutaneous revascularization would not be a reasonable option.  5.  The degree of mitral regurgitation looks to be moderate based on recent      echo and hemodynamics.  6.  Further plans to follow.           ______________________________  Jonelle Sidle, M.D. [facility]     SGM/MEDQ  D:  03/02/2005  T:  03/02/2005  Job:  [id number]   cc:   Mosetta Putt, M.D.  Fax: [phone]

## 2010-06-12 NOTE — Assessment & Plan Note (Signed)
[facility]                              CARDIOLOGY OFFICE NOTE   LOXLEY, SCHMALE                      MRN:          [id number]  DATE:08/05/2005                            DOB:          [birth year]    PRIMARY CARE PHYSICIAN:  Mosetta Putt, M.D.   ELECTROPHYSIOLOGIST:  Doylene Canning. Ladona Ridgel, M.D.   REASON FOR VISIT:  Routine followup.   HISTORY OF PRESENT ILLNESS:  Since the patient's last visit, he has  undergone placement of a single chamber Medtronic defibrillator by Dr.  Ladona Ridgel, given history of nonsustained ventricular tachycardia and ischemic  cardiomyopathy with an ejection fraction in the 25-30% range.  He tolerated  this well, and has followup in our electrophysiology division.  He has had  no device discharges.  He states that he has recently completely cardiac  rehabilitation, and has not had any problems with progressive angina or  shortness of breath.  I reviewed some medications below.  He did ask about  total duration of Coumadin.  This was initiated in the hospital by Dr. Cornelius Moras  following his mitral annuloplasty.  Typically, total duration would be 3-6  months.  I have a call placed to Dr. Cornelius Moras to clarify this.  There is  otherwise no clear atrial fibrillation.  He does have a cardiomyopathy, but  no obvious mural thrombus based on available information.  He has been on  Coumadin since February with fluctuating INR results.  Today, in fact, his  INR checks out greater than 6 with final level pending from a lab draw.  He  has not had any problems with bleeding.   ALLERGIES:  No known drug allergies.   PRESENT MEDICATIONS:  1.  Simvastatin 20 mg p.o. q.h.s.  2.  Lisinopril 10 mg p.o. daily.  3.  Aspirin 81 mg p.o. daily.  4.  Coreg 3.125 mg p.o. b.i.d.  5.  Coumadin as directed by the [facility].  6.  Lasix 20 mg p.o. daily.  7.  Potassium supplements 10 mEq p.o. daily.   REVIEW OF SYSTEMS:  As described in the history of  present illness.   PHYSICAL EXAMINATION:  VITAL SIGNS:  Blood pressure 100/60, heart rate 70  and regular.  Weight is 179 pounds, which is up 4 pounds from his last visit  with me.  GENERAL:  The patient is well-nourished, in no acute distress.  NECK:  No jugular venous pressure or loud bruits.  LUNGS:  Clear without labored breathing.  CARDIAC:  A regular rate and rhythm without loud murmur or gallop.  CHEST:  The device pocket is well healed, as is the sternal incision.  EXTREMITIES:  No significant pitting edema.   IMPRESSION AND RECOMMENDATIONS:  1.  Ischemic cardiomyopathy with ejection fraction approximately 25-30%,      status post revascularization surgery with concomitant mitral valve      annuloplasty by Dr. Cornelius Moras in February.  I have a call placed to Dr. Cornelius Moras      regarding total duration of Coumadin, which I would anticipate would      range from 3-6  months following his surgery.  He has met the 45-month      goal at this time.  Otherwise, the plan will be to continue medical      therapy and have office followup over the next 6 months with me.  2.  Status post single chamber Medtronic defibrillator by Dr. Ladona Ridgel.  The      plan will be to continue followup in our Device Clinic.  3.  History of hyperlipidemia, on Zocor.                                Jonelle Sidle, MD    SGM/MedQ  DD:  08/05/2005  DT:  08/05/2005  Job #:  [id number]   cc:   Mosetta Putt, MD  Doylene Canning. Ladona Ridgel, MD

## 2010-06-12 NOTE — Discharge Summary (Signed)
NAMEJAMILLE, [name] Roberts NO.:  [id number]   MEDICAL RECORD NO.:  [id number]          PATIENT TYPE:  INP   LOCATION:  2004                         FACILITY:  [facility]   PHYSICIAN:  Jonelle Sidle, M.D. LHCDATE OF BIRTH:  September 15, 1940   DATE OF ADMISSION:  03/02/2005  DATE OF DISCHARGE:  03/03/2005                                 DISCHARGE SUMMARY   CHIEF COMPLAINT:  1.  A 2-vessel coronary artery disease, scheduled for bypass surgery next      week.  2.  Ischemic cardiomyopathy with an EF of 25% at catheterization.  3.  1+ to 2+ MR seen at catheterization.  4.  Hyperlipidemia.  5.  Status post appendectomy.  6.  Family history of coronary artery disease in his father who developed      heart problems at the age of 83.  7.  Remote history of MI possibly due to vasospasm in 1981.  8.  Volume overload with a BNP of 782 in January 2007.   PROCEDURES:  1.  Cardiac catheterization.  2.  Coronary arteriogram.  3.  Left ventriculogram.  4.  Pre-coronary artery bypass graft Dopplers.   HOSPITAL COURSE:  [name] Roberts is a 70 year old male who was evaluated by  Dr. Diona Browner in January 2007 for volume overload.  An echocardiogram was  performed at that time. He was seen on February 25, 2005 in followup and he  had Coreg added to his medication regimen and was scheduled for right and  left heart catheterization.  He was admitted for this on March 02, 2005.   The cardiac catheterization showed approximately 60% left main with an 80%  LAD and a circumflex that was totaled and filled by collateral circulation.  He had a 30% stenosis in the first diagonal in the RCA.  His EF was  approximately 25% with 1+ to 2+ MR.  Dr. Diona Browner evaluated the valves and  felt that the osteal left main appeared less significant in some views and  did not damp with the #4 French catheter.  His main pulmonary artery  pressure was 24 and a wedge of 17.  He was admitted for a CVTS consult.   He  was seen by Dr. Cornelius Moras and it was felt that he needed bypass surgery with  possible mitral valve repair.  Dr. Cornelius Moras felt that the surgery could be  postponed until next week and that [name] Roberts was stable to go home, and  come back for surgery as an outpatient.  On March 03, 2005 [name] Roberts's  vital signs were stable.  His groin was without hematoma or ecchymosis.  He  had no chest pain or dizziness with ambulation.  He had pre-CABG Dopplers  and labs drawn.  [name] Roberts was considered stable for discharge on  March 03, 2005 and is to return on Thursday February 15 for his surgery.   TIME AT DISCHARGE:  32 minutes.   DISCHARGE INSTRUCTIONS:  1.  His activity level is to be minimal.  2.  He is to stick to a low fat diet.  3.  He is to  take all of his medications as prescribed.  4.  He was given a prescription for sublingual nitroglycerin and is to use      this as needed and come to the emergency room if chest pain is not      relieved by sublingual nitroglycerin x3.   DISCHARGE MEDICATIONS:  1.  Coated aspirin 81 mg a day.  2.  Lisinopril 10 mg a day.  3.  Lasix 20 mg a day.  4.  Zocor 10 mg a day.  5.  Coreg 3.125 mg b.i.d.  6.  Sublingual nitroglycerin p.r.n.  7.  Ambient 1 q.h.s. p.r.n. #5, no refills.      Theodore Demark, P.A. [facility]    ______________________________  Jonelle Sidle, M.D. [facility]    RB/MEDQ  D:  03/03/2005  T:  03/03/2005  Job:  [id number]   cc:   Mosetta Putt, M.D.  Fax: [phone]   Jonelle Sidle, M.D. [facility]  [address]  [place]  [place] [address]   Salvatore Decent. Cornelius Moras, M.D.  [address]  [place]  [place] [address]

## 2010-06-18 ENCOUNTER — Encounter: Payer: Self-pay | Admitting: *Deleted

## 2010-06-19 ENCOUNTER — Encounter: Payer: Self-pay | Admitting: *Deleted

## 2010-06-25 ENCOUNTER — Other Ambulatory Visit: Payer: Self-pay | Admitting: Internal Medicine

## 2010-06-25 ENCOUNTER — Ambulatory Visit (INDEPENDENT_AMBULATORY_CARE_PROVIDER_SITE_OTHER): Payer: BC Managed Care – PPO | Admitting: *Deleted

## 2010-06-25 DIAGNOSIS — I428 Other cardiomyopathies: Secondary | ICD-10-CM

## 2010-07-01 NOTE — Progress Notes (Signed)
ICD REMOTE  

## 2010-07-14 ENCOUNTER — Other Ambulatory Visit: Payer: Self-pay | Admitting: Internal Medicine

## 2010-07-17 ENCOUNTER — Encounter: Payer: Self-pay | Admitting: *Deleted

## 2010-07-22 ENCOUNTER — Other Ambulatory Visit: Payer: Self-pay | Admitting: *Deleted

## 2010-07-22 MED ORDER — ALPRAZOLAM 1 MG PO TABS: 1.0000 mg | ORAL_TABLET | Freq: Every day | ORAL | Status: DC | PRN

## 2010-07-22 NOTE — Telephone Encounter (Signed)
Prescription called to pharmacy.

## 2010-07-24 ENCOUNTER — Other Ambulatory Visit: Payer: Self-pay | Admitting: *Deleted

## 2010-07-24 MED ORDER — ALPRAZOLAM 1 MG PO TABS: 1.0000 mg | ORAL_TABLET | Freq: Every day | ORAL | Status: DC | PRN

## 2010-07-24 NOTE — Telephone Encounter (Signed)
Please look into this..it appears he had this refilled already in 07/22/10 is that true?

## 2010-07-24 NOTE — Telephone Encounter (Signed)
Pharmacy says that they never received rx so i phone in 30 with 0 .

## 2010-08-27 ENCOUNTER — Other Ambulatory Visit: Payer: Self-pay | Admitting: *Deleted

## 2010-08-27 MED ORDER — ALPRAZOLAM 1 MG PO TABS: 1.0000 mg | ORAL_TABLET | Freq: Every day | ORAL | Status: DC | PRN

## 2010-08-27 NOTE — Telephone Encounter (Signed)
Last filled 07-24-2010

## 2010-08-28 NOTE — Telephone Encounter (Signed)
rx called to pharmacy 

## 2010-09-09 ENCOUNTER — Encounter: Payer: Self-pay | Admitting: Cardiovascular Disease

## 2010-09-09 ENCOUNTER — Ambulatory Visit (INDEPENDENT_AMBULATORY_CARE_PROVIDER_SITE_OTHER): Payer: BC Managed Care – PPO | Admitting: Cardiovascular Disease

## 2010-09-09 VITALS — BP 112/60 | HR 59 | Ht 70.0 in | Wt 166.0 lb

## 2010-09-09 DIAGNOSIS — I2589 Other forms of chronic ischemic heart disease: Secondary | ICD-10-CM

## 2010-09-09 DIAGNOSIS — I2510 Atherosclerotic heart disease of native coronary artery without angina pectoris: Secondary | ICD-10-CM

## 2010-09-09 MED ORDER — CARVEDILOL 6.25 MG PO TABS: 6.2500 mg | ORAL_TABLET | Freq: Two times a day (BID) | ORAL | Status: DC

## 2010-09-09 NOTE — Progress Notes (Signed)
History of Present Illness: 70 yo WM with history of ICM s/p ICD, CAD s/p CABG, hyperlipidemia here today for routine cardiac follow up.  Lipids well controlled in March 2011.  Echo 8/11 with EF of 20-25%. LV was dilated. He has been doing well. No complaints. He has been mowing his grass without chest pain or SOB. No ICD events.  No orthopnea, PND, lower extremity  edema.   Past Medical History  Diagnosis Date  . Chronic systolic heart failure   . Cardiomyopathy, ischemic   . CAD (coronary artery disease)   . HLD (hyperlipidemia)   . Herpes zoster   . Gout     Past Surgical History  Procedure Date  . Appendectomy   . Coronary artery bypass graft     ? valve repair 2007 Dr. Barry Dienes  . Cardiac defibrillator placement     Dr. Ladona Ridgel    Current Outpatient Prescriptions  Medication Sig Dispense Refill  . ALPRAZolam (XANAX) 1 MG tablet Take 1 tablet (1 mg total) by mouth daily as needed.  30 tablet  0  . aspirin 81 MG tablet Take 81 mg by mouth daily.        . carvedilol (COREG) 6.25 MG tablet        . furosemide (LASIX) 20 MG tablet Take 20 mg by mouth daily.        Marland Kitchen lisinopril (PRINIVIL,ZESTRIL) 10 MG tablet TAKE 1 TABLET BY MOUTH ONCE A DAY  90 tablet  3  . loteprednol (LOTEMAX) 0.2 % SUSP 1 drop daily.        . niacin (NIASPAN) 500 MG CR tablet Take 500 mg by mouth at bedtime.        . potassium chloride (K-DUR) 10 MEQ tablet TAKE 1 TABLET BY MOUTH ONCE A DAY  90 tablet  3  . potassium chloride SA (K-DUR,KLOR-CON) 10 MEQ tablet Take 10 mEq by mouth daily.        . simvastatin (ZOCOR) 40 MG tablet TAKE 1 TABLET BY MOUTH ONCE A DAY  90 tablet  3    No Known Allergies  History   Social History  . Marital Status: Married    Spouse Name: N/A    Number of Children: 2  . Years of Education: N/A   Occupational History  . Not on file.   Social History Main Topics  . Smoking status: Former Games developer  . Smokeless tobacco: Not on file  . Alcohol Use: 1.2 oz/week    2 Cans of beer  per week  . Drug Use: No  . Sexually Active:    Other Topics Concern  . Not on file   Social History Narrative   Exercise: 2 miles walking dailyDiet: Moderate...some fruit and veggies, but eats some meat/potatos.Limited fast food.End of Life: HCPOA wife, no living willFull Code    Family History  Problem Relation Age of Onset  . Cancer Mother     brain cancer  . Heart disease Father     CAD AND CHF    Review of Systems:  As stated in the HPI and otherwise negative.   BP 112/60  Pulse 59  Ht 5\' 10"  (1.778 m)  Wt 166 lb (75.297 kg)  BMI 23.82 kg/m2  Physical Examination: General: Well developed, well nourished, NAD HEENT: OP clear, mucus membranes moist SKIN: warm, dry. No rashes. Neuro: No focal deficits Musculoskeletal: Muscle strength 5/5 all ext Psychiatric: Mood and affect normal Neck: No JVD, no carotid bruits, no thyromegaly, no lymphadenopathy.  Lungs:Clear bilaterally, no wheezes, rhonci, crackles Cardiovascular: Regular rate and rhythm. No murmurs, gallops or rubs. Abdomen:Soft. Bowel sounds present. Non-tender.  Extremities: No lower extremity edema. Pulses are 2 + in the bilateral DP/PT.  ZOX:WRUEA brady, rate 59 bpm.

## 2010-09-09 NOTE — Patient Instructions (Signed)
Your physician recommends that you schedule a follow-up appointment in: 1 year  

## 2010-09-09 NOTE — Assessment & Plan Note (Signed)
Stable. On good medical therapy. ICD in place. No changes.

## 2010-09-09 NOTE — Assessment & Plan Note (Signed)
Stable No changes 

## 2010-09-24 ENCOUNTER — Ambulatory Visit (INDEPENDENT_AMBULATORY_CARE_PROVIDER_SITE_OTHER): Payer: BC Managed Care – PPO | Admitting: *Deleted

## 2010-09-24 ENCOUNTER — Other Ambulatory Visit: Payer: Self-pay | Admitting: Internal Medicine

## 2010-09-24 ENCOUNTER — Other Ambulatory Visit: Payer: Self-pay | Admitting: *Deleted

## 2010-09-24 DIAGNOSIS — I428 Other cardiomyopathies: Secondary | ICD-10-CM

## 2010-09-24 MED ORDER — ALPRAZOLAM 1 MG PO TABS: 1.0000 mg | ORAL_TABLET | Freq: Every day | ORAL | Status: DC | PRN

## 2010-09-25 NOTE — Telephone Encounter (Signed)
rx called to pharmacy 

## 2010-10-07 ENCOUNTER — Encounter: Payer: Self-pay | Admitting: *Deleted

## 2010-10-07 NOTE — Progress Notes (Signed)
Addended by: Judithe Modest D on: 10/07/2010 03:36 PM   Modules accepted: Orders

## 2010-10-09 NOTE — Progress Notes (Signed)
ICD checked by remote. 

## 2010-10-26 ENCOUNTER — Other Ambulatory Visit: Payer: Self-pay | Admitting: *Deleted

## 2010-10-26 MED ORDER — ALPRAZOLAM 1 MG PO TABS: 1.0000 mg | ORAL_TABLET | Freq: Every day | ORAL | Status: DC | PRN

## 2010-10-26 NOTE — Telephone Encounter (Signed)
rx called to pharmacy 

## 2010-11-17 ENCOUNTER — Encounter: Payer: Self-pay | Admitting: Internal Medicine

## 2010-11-17 ENCOUNTER — Ambulatory Visit (INDEPENDENT_AMBULATORY_CARE_PROVIDER_SITE_OTHER): Payer: BC Managed Care – PPO | Admitting: Internal Medicine

## 2010-11-17 DIAGNOSIS — Z95810 Presence of automatic (implantable) cardiac defibrillator: Secondary | ICD-10-CM

## 2010-11-17 DIAGNOSIS — I2510 Atherosclerotic heart disease of native coronary artery without angina pectoris: Secondary | ICD-10-CM

## 2010-11-17 DIAGNOSIS — I5022 Chronic systolic (congestive) heart failure: Secondary | ICD-10-CM

## 2010-11-17 DIAGNOSIS — I428 Other cardiomyopathies: Secondary | ICD-10-CM

## 2010-11-17 NOTE — Patient Instructions (Signed)
Your physician wants you to follow-up in: 12 months with Dr Taylor You will receive a reminder letter in the mail two months in advance. If you don't receive a letter, please call our office to schedule the follow-up appointment.  Remote monitoring is used to monitor your Pacemaker of ICD from home. This monitoring reduces the number of office visits required to check your device to one time per year. It allows us to keep an eye on the functioning of your device to ensure it is working properly. You are scheduled for a device check from home on 02/18/2011. You may send your transmission at any time that day. If you have a wireless device, the transmission will be sent automatically. After your physician reviews your transmission, you will receive a postcard with your next transmission date.   

## 2010-11-18 ENCOUNTER — Encounter: Payer: Self-pay | Admitting: Internal Medicine

## 2010-11-18 ENCOUNTER — Other Ambulatory Visit (INDEPENDENT_AMBULATORY_CARE_PROVIDER_SITE_OTHER): Payer: BC Managed Care – PPO

## 2010-11-18 DIAGNOSIS — E785 Hyperlipidemia, unspecified: Secondary | ICD-10-CM

## 2010-11-18 LAB — COMPREHENSIVE METABOLIC PANEL
Alkaline Phosphatase: 53 U/L (ref 39–117)
CO2: 27 mEq/L (ref 19–32)
Creatinine, Ser: 1.1 mg/dL (ref 0.4–1.5)
GFR: 67.52 mL/min (ref >60.00)
Glucose, Bld: 96 mg/dL (ref 70–99)
Sodium: 138 mEq/L (ref 135–145)
Total Bilirubin: 1.1 mg/dL (ref 0.3–1.2)
Total Protein: 7.2 g/dL (ref 6.0–8.3)

## 2010-11-18 LAB — LIPID PANEL
HDL: 39 mg/dL — ABNORMAL LOW (ref >39.00)
Total CHOL/HDL Ratio: 2
Triglycerides: 74 mg/dL (ref 0.0–149.0)
VLDL: 14.8 mg/dL (ref 0.0–40.0)

## 2010-11-18 NOTE — Assessment & Plan Note (Signed)
His symptoms remain class 1-2. He will continue his regular meds and maintain a low sodium diet.

## 2010-11-18 NOTE — Progress Notes (Signed)
HPI [name] returns today for followup. He is a pleasant 70 yo man with a h/o an  ICM, chronic systolic CHF, HTN and dyslipidemia. He is s/p ICD implant. No ICD shocks. He walks several miles a day. No Known Allergies   Current Outpatient Prescriptions  Medication Sig Dispense Refill  . ALPRAZolam (XANAX) 1 MG tablet Take 1 tablet (1 mg total) by mouth daily as needed.  30 tablet  0  . aspirin 81 MG tablet Take 81 mg by mouth daily.        . carvedilol (COREG) 6.25 MG tablet Take 1 tablet (6.25 mg total) by mouth 2 (two) times daily with a meal.  30 tablet  14  . furosemide (LASIX) 20 MG tablet Take 20 mg by mouth daily.        Marland Kitchen lisinopril (PRINIVIL,ZESTRIL) 10 MG tablet TAKE 1 TABLET BY MOUTH ONCE A DAY  90 tablet  3  . loteprednol (LOTEMAX) 0.2 % SUSP 1 drop daily.        . niacin (NIASPAN) 500 MG CR tablet Take 500 mg by mouth at bedtime.        . potassium chloride (K-DUR) 10 MEQ tablet TAKE 1 TABLET BY MOUTH ONCE A DAY  90 tablet  3  . potassium chloride SA (K-DUR,KLOR-CON) 10 MEQ tablet Take 10 mEq by mouth daily.        . simvastatin (ZOCOR) 40 MG tablet TAKE 1 TABLET BY MOUTH ONCE A DAY  90 tablet  3     Past Medical History  Diagnosis Date  . Chronic systolic heart failure   . Cardiomyopathy, ischemic   . CAD (coronary artery disease)   . HLD (hyperlipidemia)   . Herpes zoster   . Gout     ROS:   All systems reviewed and negative except as noted in the HPI.   Past Surgical History  Procedure Date  . Appendectomy   . Coronary artery bypass graft     ? valve repair 2007 Dr. Barry Dienes  . Cardiac defibrillator placement     Dr. Ladona Ridgel     Family History  Problem Relation Age of Onset  . Cancer Mother     brain cancer  . Heart disease Father     CAD AND CHF     History   Social History  . Marital Status: Married    Spouse Name: N/A    Number of Children: 2  . Years of Education: N/A   Occupational History  . Not on file.   Social History Main  Topics  . Smoking status: Former Games developer  . Smokeless tobacco: Not on file  . Alcohol Use: 1.2 oz/week    2 Cans of beer per week  . Drug Use: No  . Sexually Active:    Other Topics Concern  . Not on file   Social History Narrative   Exercise: 2 miles walking dailyDiet: Moderate...some fruit and veggies, but eats some meat/potatos.Limited fast food.End of Life: HCPOA wife, no living willFull Code     BP 102/52  Pulse 57  Ht 5\' 10"  (1.778 m)  Wt 171 lb 1.9 oz (77.62 kg)  BMI 24.55 kg/m2  Physical Exam:  Well appearing NAD HEENT: Unremarkable Neck:  No JVD, no thyromegally Lymphatics:  No adenopathy Back:  No CVA tenderness Lungs:  Clear HEART:  Regular rate rhythm, no murmurs, no rubs, no clicks Abd:  soft, positive bowel sounds, no organomegally, no rebound, no guarding Ext:  2 plus  pulses, no edema, no cyanosis, no clubbing Skin:  No rashes no nodules Neuro:  CN II through XII intact, motor grossly intact  DEVICE  Normal device function.  See PaceArt for details.   Assess/Plan:

## 2010-11-18 NOTE — Assessment & Plan Note (Signed)
HIs device is working normally. Will recheck in several months. 

## 2010-11-18 NOTE — Assessment & Plan Note (Signed)
He denies anginal symptoms. He will continue his current meds.  

## 2010-11-23 ENCOUNTER — Ambulatory Visit (INDEPENDENT_AMBULATORY_CARE_PROVIDER_SITE_OTHER): Payer: BC Managed Care – PPO | Admitting: Family Medicine

## 2010-11-23 ENCOUNTER — Encounter: Payer: Self-pay | Admitting: Family Medicine

## 2010-11-23 DIAGNOSIS — R7309 Other abnormal glucose: Secondary | ICD-10-CM

## 2010-11-23 DIAGNOSIS — I5022 Chronic systolic (congestive) heart failure: Secondary | ICD-10-CM

## 2010-11-23 DIAGNOSIS — E785 Hyperlipidemia, unspecified: Secondary | ICD-10-CM

## 2010-11-23 DIAGNOSIS — I2510 Atherosclerotic heart disease of native coronary artery without angina pectoris: Secondary | ICD-10-CM

## 2010-11-23 NOTE — Patient Instructions (Signed)
Continue working on healthy lifestyle and healthy eating.

## 2010-11-23 NOTE — Progress Notes (Signed)
  Subjective:    Patient ID: [name] Roberts, male    DOB: 03/09/1940, 70 y.o.   MRN: [id number]  HPI  Elevated Cholesterol: On simvastatin 40 mg daily, niacin LDL at goal <70, HDL remains low. Using medications without problems:None Muscle aches: None Diet compliance:Good Exercise: Daily 2 miles walking. Other complaints: no CP, stable SOB, no edema.  CAD, CHF stage 1-2, ischemic cardiomyopathy; euvolemic on coreg, lisinopril, lasix BP well controlled.  Has defibrillator in place Followed by Dr. Ladona Ridgel and Dr. Dicie Beam..saw last in 10/2010 and 08/2010 repectively. OV notes reviewed in detail.  Stable cardiac issues, no changes made.   Elevated LFTS: stable, minimal elevation. Likely due to fatty liver and statin. Neg hepatitis panel in 2011  Prediabetes: resolved with lifestyle changes.   Review of Systems  Constitutional: Negative for fever and fatigue.  Respiratory: Negative for cough and wheezing.   Cardiovascular: Negative for chest pain, palpitations and leg swelling.  Gastrointestinal: Negative for abdominal pain, diarrhea, constipation, blood in stool and abdominal distention.  Genitourinary: Negative for dysuria.       Objective:   Physical Exam  Constitutional: He is oriented to person, place, and time. Vital signs are normal. He appears well-developed and well-nourished.  HENT:  Head: Normocephalic.  Right Ear: Hearing normal.  Left Ear: Hearing normal.  Nose: Nose normal.  Mouth/Throat: Oropharynx is clear and moist and mucous membranes are normal.  Neck: Trachea normal. Carotid bruit is not present. No mass and no thyromegaly present.  Cardiovascular: Normal rate, regular rhythm and normal pulses.  Exam reveals no gallop, no distant heart sounds and no friction rub.   No murmur heard.      No peripheral edema  Pulmonary/Chest: Effort normal and breath sounds normal. No respiratory distress.  Neurological: He is alert and oriented to person, place, and time.    Skin: Skin is warm, dry and intact. No rash noted.  Psychiatric: He has a normal mood and affect. His speech is normal and behavior is normal. Thought content normal.          Assessment & Plan:

## 2010-11-23 NOTE — Assessment & Plan Note (Signed)
Resolved

## 2010-11-23 NOTE — Assessment & Plan Note (Signed)
Euvolemic on current medications. Stable SOB.

## 2010-11-23 NOTE — Assessment & Plan Note (Signed)
Stable well controlle don simvastain. LDL at goal <70 and HDL increasing on niacin.

## 2010-11-25 ENCOUNTER — Other Ambulatory Visit: Payer: Self-pay | Admitting: Family Medicine

## 2010-11-26 ENCOUNTER — Other Ambulatory Visit: Payer: Self-pay | Admitting: *Deleted

## 2010-11-26 MED ORDER — ALPRAZOLAM 1 MG PO TABS: 1.0000 mg | ORAL_TABLET | Freq: Every day | ORAL | Status: DC | PRN

## 2010-11-26 NOTE — Telephone Encounter (Signed)
rx called to pharmacy 

## 2010-12-25 ENCOUNTER — Other Ambulatory Visit: Payer: Self-pay | Admitting: *Deleted

## 2010-12-25 MED ORDER — ALPRAZOLAM 1 MG PO TABS: 1.0000 mg | ORAL_TABLET | Freq: Every day | ORAL | Status: DC | PRN

## 2010-12-25 NOTE — Telephone Encounter (Signed)
rx called pharmacy  

## 2011-01-27 ENCOUNTER — Other Ambulatory Visit: Payer: Self-pay | Admitting: *Deleted

## 2011-01-27 MED ORDER — ALPRAZOLAM 1 MG PO TABS: 1.0000 mg | ORAL_TABLET | Freq: Every day | ORAL | Status: DC | PRN

## 2011-01-27 NOTE — Telephone Encounter (Signed)
OK to refill? Last OV 11/23/10 and last filled 12/25/10.

## 2011-01-28 NOTE — Telephone Encounter (Signed)
rx called to pharmacy 

## 2011-02-18 ENCOUNTER — Encounter: Payer: BC Managed Care – PPO | Admitting: *Deleted

## 2011-02-22 ENCOUNTER — Encounter: Payer: Self-pay | Admitting: *Deleted

## 2011-02-24 ENCOUNTER — Other Ambulatory Visit: Payer: Self-pay | Admitting: *Deleted

## 2011-02-24 MED ORDER — ALPRAZOLAM 1 MG PO TABS: 1.0000 mg | ORAL_TABLET | Freq: Every day | ORAL | Status: DC | PRN

## 2011-02-25 NOTE — Telephone Encounter (Signed)
rx called to pharmacy 

## 2011-02-26 ENCOUNTER — Ambulatory Visit (INDEPENDENT_AMBULATORY_CARE_PROVIDER_SITE_OTHER): Payer: BC Managed Care – PPO | Admitting: *Deleted

## 2011-02-26 ENCOUNTER — Encounter: Payer: Self-pay | Admitting: Internal Medicine

## 2011-02-26 DIAGNOSIS — Z95810 Presence of automatic (implantable) cardiac defibrillator: Secondary | ICD-10-CM

## 2011-02-26 DIAGNOSIS — I428 Other cardiomyopathies: Secondary | ICD-10-CM

## 2011-02-27 LAB — REMOTE ICD DEVICE
BRDY-0002RV: 40 {beats}/min
CHARGE TIME: 8.93 s
DEV-0020ICD: NEGATIVE
RV LEAD AMPLITUDE: 11.3 mv
TZAT-0001FASTVT: 1
TZAT-0001SLOWVT: 1
TZAT-0001SLOWVT: 2
TZAT-0004FASTVT: 8
TZAT-0012FASTVT: 200 ms
TZAT-0018SLOWVT: NEGATIVE
TZAT-0018SLOWVT: NEGATIVE
TZAT-0019FASTVT: 8 V
TZAT-0019SLOWVT: 8 V
TZAT-0019SLOWVT: 8 V
TZAT-0020FASTVT: 1.6 ms
TZAT-0020SLOWVT: 1.6 ms
TZON-0003FASTVT: 240 ms
TZON-0005SLOWVT: 12
TZON-0008FASTVT: 0 ms
TZON-0008SLOWVT: 0 ms
TZST-0001FASTVT: 2
TZST-0001FASTVT: 4
TZST-0001FASTVT: 6
TZST-0001SLOWVT: 3
TZST-0001SLOWVT: 4
TZST-0001SLOWVT: 5
TZST-0003FASTVT: 25 J
TZST-0003FASTVT: 35 J
TZST-0003FASTVT: 35 J
TZST-0003SLOWVT: 35 J

## 2011-03-03 ENCOUNTER — Encounter: Payer: Self-pay | Admitting: *Deleted

## 2011-03-03 NOTE — Progress Notes (Signed)
Remote defib check  

## 2011-03-09 ENCOUNTER — Telehealth: Payer: Self-pay | Admitting: Family Medicine

## 2011-03-09 NOTE — Telephone Encounter (Signed)
Triage Record Num: [id number] Operator: Arline Asp Loftin Patient Name: [name] Roberts Call Date & Time: 03/08/2011 12:02:56PM Patient Phone: [phone] PCP: Kerby Nora Patient Gender: Male PCP Fax : [phone] Patient DOB: 29-Jan-1940 Practice Name: [facility] Reason for Call: Caller: [name] Roberts/Patient; PCP: Excell Seltzer.; CB#: [phone]; ; ; Call regarding Dx: diagnosed with sinus infection at [facility] on 02/10, and started Amox TRK CLV, On 02/11 he continues with nasal drainage, cough, and feeling hot/cold with chills. Has had 3 doses of antibiotic. Nasal drainage onset 02/04. Emergent sx negative. Home care advice per "Upper Respiratory Infection" due to "new onset of nasal congestion, runny nose, mild headache." Advised to continue abx as prescribed, recommended to call back if fever >48h on abx, or sx have not improved by day 4 on abx. Call back parameters reviewed. Protocol(s) Used: Upper Respiratory Infection (URI) Recommended Outcome per Protocol: Provide Home/Self Care Reason for Outcome: New onset of two or more of the following symptoms: nasal congestion with runny nose; sneezing; itchy or mild sore throat; mild headache or body aches; mild fatigue; low grade fever up to 101.5 F (38.6C) usually lasting about a week Care Advice: ~ Use a cool mist humidifier to moisten air. Be sure to clean according to manufacturer's instructions. ~ Call provider if symptoms worsen or new symptoms develop. ~ Consider use of a saline nasal spray per package directions to help relieve nasal congestion. Most adults need to drink 6-10 eight-ounce glasses (1.2-2.0 liters) of fluids per day unless previously told to limit fluid intake for other medical reasons. Limit fluids that contain caffeine, sugar or alcohol. Urine will be a very light yellow color when you drink enough fluids. ~ ~ Rest until symptoms improve. If more than [redacted] weeks pregnant, lie on left side  when resting. 03/08/2011 12:15:56PM Page 1 of 1 [id number]

## 2011-03-11 ENCOUNTER — Ambulatory Visit (INDEPENDENT_AMBULATORY_CARE_PROVIDER_SITE_OTHER): Payer: BC Managed Care – PPO | Admitting: Family Medicine

## 2011-03-11 ENCOUNTER — Encounter: Payer: Self-pay | Admitting: Family Medicine

## 2011-03-11 DIAGNOSIS — R062 Wheezing: Secondary | ICD-10-CM

## 2011-03-11 DIAGNOSIS — J209 Acute bronchitis, unspecified: Secondary | ICD-10-CM

## 2011-03-11 MED ORDER — PREDNISONE 20 MG PO TABS: ORAL_TABLET | ORAL | Status: DC

## 2011-03-11 MED ORDER — LEVOFLOXACIN 500 MG PO TABS: 500.0000 mg | ORAL_TABLET | Freq: Every day | ORAL | Status: AC

## 2011-03-11 NOTE — Progress Notes (Signed)
  Patient Name: [name] Roberts Date of Birth: 1940-03-05 Age: 71 y.o. Medical Record Number: [id number] Gender: male Date of Encounter: 03/11/2011  History of Present Illness:  [name] Roberts is a 71 y.o. very pleasant male patient who presents with the following:  Sunday went to Urgent Care on Sunday. About two weeks ago, nose started to drip some clear liquid. At the end of last week, started to get sicker. On Sat, felt freezing on e minute and then cold the next. They did a flu swab and that was negative. Felt like she had a severe sinus infection - placed on Aumentin.   Now cough has gotten worse. Today has been a little bit better. Some mild diffulty breathing  Smoked for a very long time, quit in 2006   Past Medical History, Surgical History, Social History, Family History, Problem List, Medications, and Allergies have been reviewed and updated if relevant.  Review of Systems: ROS: GEN: Acute illness details above GI: Tolerating PO intake GU: maintaining adequate hydration and urination Pulm: No SOB Interactive and getting along well at home.  Otherwise, ROS is as per the HPI.   Physical Examination: Filed Vitals:   03/11/11 1051  BP: 120/72  Pulse: 68  Temp: 97.5 F (36.4 C)  TempSrc: Oral  Height: 5\' 10"  (1.778 m)  Weight: 176 lb 6.4 oz (80.015 kg)  SpO2: 98%    Body mass index is 25.31 kg/(m^2).   GEN: A and O x 3. WDWN. NAD.    ENT: Nose clear, ext NML.  No LAD.  No JVD.  TM's clear. Oropharynx clear.  PULM: Normal WOB, no distress. Scattered wheezes. No crackles CV: RRR, no M/G/R, No rubs, No JVD.   EXT: warm and well-perfused, No c/c/e. PSYCH: Pleasant and conversant.   Assessment and Plan: 1. Bronchitis, acute  levofloxacin (LEVAQUIN) 500 MG tablet, predniSONE (DELTASONE) 20 MG tablet  2. Wheezing  levofloxacin (LEVAQUIN) 500 MG tablet, predniSONE (DELTASONE) 20 MG tablet    Does not carry dx of copd, but long smoking history. Clinically  suspicious and wheezing Short pred burst LVQ

## 2011-03-24 ENCOUNTER — Other Ambulatory Visit: Payer: Self-pay | Admitting: *Deleted

## 2011-03-24 NOTE — Telephone Encounter (Signed)
Faxed [facility], last filled 02/25/11.

## 2011-03-25 ENCOUNTER — Other Ambulatory Visit: Payer: Self-pay | Admitting: Family Medicine

## 2011-03-25 MED ORDER — ALPRAZOLAM 1 MG PO TABS: 1.0000 mg | ORAL_TABLET | Freq: Every day | ORAL | Status: DC | PRN

## 2011-03-25 NOTE — Telephone Encounter (Signed)
Medicine called to pharmacy. 

## 2011-04-06 ENCOUNTER — Other Ambulatory Visit: Payer: Self-pay

## 2011-04-06 MED ORDER — CARVEDILOL 6.25 MG PO TABS: 6.2500 mg | ORAL_TABLET | Freq: Two times a day (BID) | ORAL | Status: DC

## 2011-04-06 NOTE — Telephone Encounter (Signed)
..   Requested Prescriptions   Signed Prescriptions Disp Refills  . carvedilol (COREG) 6.25 MG tablet 30 tablet 11    Sig: Take 1 tablet (6.25 mg total) by mouth 2 (two) times daily with a meal.    Authorizing Provider: Verne Carrow D    Ordering User: Lacie Scotts

## 2011-04-19 ENCOUNTER — Encounter: Payer: Self-pay | Admitting: Internal Medicine

## 2011-04-26 ENCOUNTER — Other Ambulatory Visit: Payer: Self-pay | Admitting: Internal Medicine

## 2011-04-26 ENCOUNTER — Other Ambulatory Visit: Payer: Self-pay | Admitting: Family Medicine

## 2011-04-26 ENCOUNTER — Other Ambulatory Visit: Payer: Self-pay

## 2011-04-26 MED ORDER — ALPRAZOLAM 1 MG PO TABS: 1.0000 mg | ORAL_TABLET | Freq: Every day | ORAL | Status: DC | PRN

## 2011-04-26 NOTE — Telephone Encounter (Signed)
[facility] [employer] faxed request for alprazolam 1 mg. Last filled 03/25/11 and pt last saw Dr Patsy Lager 03/11/11.Please advise.

## 2011-04-27 NOTE — Telephone Encounter (Signed)
[facility] [place]

## 2011-04-28 ENCOUNTER — Other Ambulatory Visit: Payer: Self-pay | Admitting: Internal Medicine

## 2011-04-28 ENCOUNTER — Other Ambulatory Visit: Payer: Self-pay

## 2011-04-28 MED ORDER — CARVEDILOL 6.25 MG PO TABS: 6.2500 mg | ORAL_TABLET | Freq: Two times a day (BID) | ORAL | Status: DC

## 2011-04-28 NOTE — Telephone Encounter (Signed)
Pt is out of pills 

## 2011-05-25 ENCOUNTER — Other Ambulatory Visit: Payer: Self-pay | Admitting: *Deleted

## 2011-05-25 MED ORDER — ALPRAZOLAM 1 MG PO TABS: 1.0000 mg | ORAL_TABLET | Freq: Every day | ORAL | Status: DC | PRN

## 2011-05-25 NOTE — Telephone Encounter (Signed)
Last appt in end of 2012, last refill 04/2011

## 2011-05-25 NOTE — Telephone Encounter (Signed)
rx called to pharmacy 

## 2011-05-27 ENCOUNTER — Encounter: Payer: BC Managed Care – PPO | Admitting: *Deleted

## 2011-06-01 ENCOUNTER — Telehealth: Payer: Self-pay | Admitting: Family Medicine

## 2011-06-01 ENCOUNTER — Encounter: Payer: Self-pay | Admitting: *Deleted

## 2011-06-01 DIAGNOSIS — R7309 Other abnormal glucose: Secondary | ICD-10-CM

## 2011-06-01 DIAGNOSIS — M109 Gout, unspecified: Secondary | ICD-10-CM

## 2011-06-01 DIAGNOSIS — E785 Hyperlipidemia, unspecified: Secondary | ICD-10-CM

## 2011-06-01 NOTE — Telephone Encounter (Signed)
Message copied by Excell Seltzer on Tue Jun 01, 2011 10:31 AM ------      Message from: Baldomero Lamy      Created: Tue Jun 01, 2011 10:30 AM      Regarding: Cpx labs Thurs 5/9       Please order  future cpx labs for pt's upcomming lab appt.      Thanks      Rodney Booze

## 2011-06-03 ENCOUNTER — Other Ambulatory Visit (INDEPENDENT_AMBULATORY_CARE_PROVIDER_SITE_OTHER): Payer: BC Managed Care – PPO

## 2011-06-03 DIAGNOSIS — E785 Hyperlipidemia, unspecified: Secondary | ICD-10-CM

## 2011-06-03 DIAGNOSIS — M109 Gout, unspecified: Secondary | ICD-10-CM

## 2011-06-03 DIAGNOSIS — R7309 Other abnormal glucose: Secondary | ICD-10-CM

## 2011-06-03 LAB — COMPREHENSIVE METABOLIC PANEL
ALT: 59 U/L — ABNORMAL HIGH (ref 0–53)
Albumin: 4.2 g/dL (ref 3.5–5.2)
CO2: 25 mEq/L (ref 19–32)
Calcium: 9.6 mg/dL (ref 8.4–10.5)
Chloride: 105 mEq/L (ref 96–112)
GFR: 63.54 mL/min (ref >60.00)
Sodium: 139 mEq/L (ref 135–145)
Total Protein: 7.5 g/dL (ref 6.0–8.3)

## 2011-06-03 LAB — LIPID PANEL: Total CHOL/HDL Ratio: 3

## 2011-06-04 ENCOUNTER — Other Ambulatory Visit: Payer: BC Managed Care – PPO

## 2011-06-10 ENCOUNTER — Encounter: Payer: Self-pay | Admitting: Internal Medicine

## 2011-06-10 ENCOUNTER — Ambulatory Visit (INDEPENDENT_AMBULATORY_CARE_PROVIDER_SITE_OTHER): Payer: BC Managed Care – PPO | Admitting: *Deleted

## 2011-06-10 DIAGNOSIS — Z95810 Presence of automatic (implantable) cardiac defibrillator: Secondary | ICD-10-CM

## 2011-06-10 DIAGNOSIS — I5022 Chronic systolic (congestive) heart failure: Secondary | ICD-10-CM

## 2011-06-11 ENCOUNTER — Ambulatory Visit (INDEPENDENT_AMBULATORY_CARE_PROVIDER_SITE_OTHER): Payer: BC Managed Care – PPO | Admitting: Family Medicine

## 2011-06-11 ENCOUNTER — Encounter: Payer: Self-pay | Admitting: Family Medicine

## 2011-06-11 VITALS — BP 120/60 | HR 57 | Temp 97.9°F | Ht 70.0 in | Wt 173.8 lb

## 2011-06-11 DIAGNOSIS — I2510 Atherosclerotic heart disease of native coronary artery without angina pectoris: Secondary | ICD-10-CM

## 2011-06-11 DIAGNOSIS — Z23 Encounter for immunization: Secondary | ICD-10-CM

## 2011-06-11 DIAGNOSIS — E785 Hyperlipidemia, unspecified: Secondary | ICD-10-CM

## 2011-06-11 DIAGNOSIS — Z0000 Encounter for general adult medical examination without abnormal findings: Secondary | ICD-10-CM

## 2011-06-11 DIAGNOSIS — R7309 Other abnormal glucose: Secondary | ICD-10-CM

## 2011-06-11 MED ORDER — FLUTICASONE PROPIONATE 50 MCG/ACT NA SUSP: 2.0000 | Freq: Every day | NASAL | Status: DC

## 2011-06-11 NOTE — Progress Notes (Signed)
HPI  The patient is here for annual wellness exam and preventative care.    High cholesterol: Well controlled on simvastain 40 mg daily.  Lab Results  Component Value Date   CHOL 114 06/03/2011   HDL 45.00 06/03/2011   LDLCALC 57 06/03/2011   TRIG 62.0 06/03/2011   CHOLHDL 3 06/03/2011    CAD, CHF: Seen cards [facility] 08/2009..stable on current meds  history of ICM s/p ICD, CAD s/p CABG  He has been mowing his grass without chest pain or SOB. No ICD events. No orthopnea, PND, lower ext edema.   Prediabetes: improved, FBS 99 Limiting sweets some.  Walking 2 miles every morning.  Work part-time at [employer].   Anxiety, well controlled..using nightly for insomnia.  Main issue is that coreg "jacks him up".  No daytime anxiety, no depression.  No SI.   Right ear feels full, decreased hearing. Noted 2 weeks ago. No injury. No ear pain.  Gout: no flares in last year. Has been eating a lot of sardines  Review of Systems  Constitutional: Negative for fever, fatigue and unexpected weight change.  HENT: Negative for ear pain, congestion, sore throat, rhinorrhea, trouble swallowing and postnasal drip.  Eyes: Negative for pain.  Respiratory: Negative for cough, shortness of breath and wheezing.  Cardiovascular: Negative for chest pain, palpitations and leg swelling.  Gastrointestinal: Negative for nausea, abdominal pain, diarrhea, constipation and blood in stool.  Genitourinary: Negative for dysuria, urgency, hematuria, discharge, penile swelling, scrotal swelling, difficulty urinating, penile pain and testicular pain.  Skin: Negative for rash.  Neurological: Negative for syncope, weakness, light-headedness, numbness and headaches.  Psychiatric/Behavioral: Negative for behavioral problems and dysphoric mood. The patient is not nervous/anxious.    Objective:   Physical Exam  Constitutional: He appears well-developed and well-nourished. Non-toxic appearance. He does not appear ill. No distress.   HENT:  Head: Normocephalic and atraumatic.  Right Ear: Hearing, tympanic membrane, external ear and ear canal normal.  Left Ear: Hearing, tympanic membrane, external ear and ear canal normal.  Nose: Nose normal.  Mouth/Throat: Uvula is midline, oropharynx is clear and moist and mucous membranes are normal.  Eyes: Conjunctivae, EOM and lids are normal. Pupils are equal, round, and reactive to light. No foreign bodies found.  Neck: Trachea normal, normal range of motion and phonation normal. Neck supple. Carotid bruit is not present. No mass and no thyromegaly present.  Cardiovascular: Normal rate, regular rhythm, S1 normal, S2 normal, intact distal pulses and normal pulses. Exam reveals no gallop.  No murmur heard.  Pulmonary/Chest: Breath sounds normal. He has no wheezes. He has no rhonchi. He has no rales.  Abdominal: Soft. Normal appearance and bowel sounds are normal. There is no hepatosplenomegaly. There is no tenderness. There is no rebound, no guarding and no CVA tenderness. No hernia. Hernia confirmed negative in the right inguinal area and confirmed negative in the left inguinal area.  Genitourinary: Prostate normal, testes normal and penis normal. Rectal exam shows no external hemorrhoid, no internal hemorrhoid, no fissure, no mass, no tenderness and anal tone normal. Prostate is not enlarged and not tender. Right testis shows no mass and no tenderness. Left testis shows no mass and no tenderness. No paraphimosis or penile tenderness.  Lymphadenopathy:  He has no cervical adenopathy.  Right: No inguinal adenopathy present.  Left: No inguinal adenopathy present.  Neurological: He is alert. He has normal strength and normal reflexes. No cranial nerve deficit or sensory deficit. Gait normal.  Skin: Skin is warm,  dry and intact. No rash noted.  Psychiatric: He has a normal mood and affect. His speech is normal and behavior is normal. Judgment normal.    Assessment & Plan:   Complete  Physical Exam The patient's preventative maintenance and recommended screening tests for an annual wellness exam were reviewed in full today.  Brought up to date unless services declined.  Counselled on the importance of diet, exercise, and its role in overall health and mortality.  The patient's FH and SH was reviewed, including their home life, tobacco status, and drug and alcohol status.   Vaccines: PNA 2008 due now, Td 2010, consider shingles, not intrerested Colon: 2008.Marland Kitchen Repeat in 5-7 years. Prostate: not indicated, pt agreeable   PREDIABETES -  Resolved TRANSAMINASES, SERUM, ELEVATED -  Stable.. Likely due to statin and fatty liver.. Improved over time.  HYPERLIPIDEMIA -  LDL at goal <70 on medication. CORONARY ARTERY DISEASE - Stable CARDIOMYOPATHY, ISCHEMIC - Stable per cards.  Ear fullness/ Decreased hearing, right ear: trial of nasal steroid. If not imrpoving refer to ENT .

## 2011-06-11 NOTE — Patient Instructions (Addendum)
Decrease sardine, low uric acid diet. Work on good cholesterol by increasing veggie fats like nuts, avacado. Ear fullness/ Decreased hearing, right ear: trial of nasal steroid. If not improving call us to refer to ENT .

## 2011-06-11 NOTE — Progress Notes (Signed)
Addended by: Consuello Masse on: 06/11/2011 12:19 PM   Modules accepted: Orders

## 2011-06-11 NOTE — Assessment & Plan Note (Signed)
Not interested in referral to GI at this time. No family history. Will continue to follow.

## 2011-06-14 LAB — REMOTE ICD DEVICE
BATTERY VOLTAGE: 2.93 V
CHARGE TIME: 8.93 s
RV LEAD IMPEDENCE ICD: 456 Ohm
TOT-0006: 20130201000000
TZAT-0001FASTVT: 1
TZAT-0004FASTVT: 8
TZAT-0004SLOWVT: 6
TZAT-0004SLOWVT: 8
TZAT-0005FASTVT: 88 pct
TZAT-0005SLOWVT: 84 pct
TZAT-0005SLOWVT: 91 pct
TZAT-0011FASTVT: 10 ms
TZAT-0011SLOWVT: 10 ms
TZAT-0011SLOWVT: 10 ms
TZAT-0012SLOWVT: 200 ms
TZAT-0012SLOWVT: 200 ms
TZAT-0013FASTVT: 1
TZAT-0013SLOWVT: 2
TZAT-0018FASTVT: NEGATIVE
TZAT-0020SLOWVT: 1.6 ms
TZAT-0020SLOWVT: 1.6 ms
TZON-0003SLOWVT: 340 ms
TZON-0004SLOWVT: 16
TZST-0001FASTVT: 2
TZST-0001FASTVT: 3
TZST-0001FASTVT: 5
TZST-0001SLOWVT: 3
TZST-0001SLOWVT: 4
TZST-0003FASTVT: 35 J
TZST-0003FASTVT: 35 J
TZST-0003SLOWVT: 25 J
TZST-0003SLOWVT: 35 J
VENTRICULAR PACING ICD: 1 pct

## 2011-06-25 ENCOUNTER — Other Ambulatory Visit: Payer: Self-pay | Admitting: *Deleted

## 2011-06-25 MED ORDER — ALPRAZOLAM 1 MG PO TABS: 1.0000 mg | ORAL_TABLET | Freq: Every day | ORAL | Status: DC | PRN

## 2011-06-25 NOTE — Telephone Encounter (Signed)
Medication phoned to pharmacy.  

## 2011-06-25 NOTE — Telephone Encounter (Signed)
Faxed refill [facility] from [facility], last filled 30 on 05/25/11.

## 2011-06-28 ENCOUNTER — Encounter: Payer: Self-pay | Admitting: *Deleted

## 2011-07-23 ENCOUNTER — Other Ambulatory Visit: Payer: Self-pay | Admitting: *Deleted

## 2011-07-23 NOTE — Telephone Encounter (Signed)
Due for cpx... Please scheule . Refill until then.

## 2011-07-23 NOTE — Telephone Encounter (Signed)
OK to refill? Last filled 06/25/11. 

## 2011-07-26 MED ORDER — ALPRAZOLAM 1 MG PO TABS: 1.0000 mg | ORAL_TABLET | Freq: Every day | ORAL | Status: DC | PRN

## 2011-07-26 NOTE — Telephone Encounter (Signed)
Patient just had cpx in may is it okay to refill?

## 2011-07-27 ENCOUNTER — Other Ambulatory Visit: Payer: Self-pay

## 2011-07-27 NOTE — Telephone Encounter (Signed)
Pt left v/m alprazolam had not been refilled. Called to [facility] this AM. Left. V/m on pts home phone med refilled this AM.

## 2011-07-27 NOTE — Telephone Encounter (Signed)
rx called to pharmacy 

## 2011-08-26 ENCOUNTER — Other Ambulatory Visit: Payer: Self-pay | Admitting: *Deleted

## 2011-08-26 MED ORDER — ALPRAZOLAM 1 MG PO TABS: 1.0000 mg | ORAL_TABLET | Freq: Every day | ORAL | Status: DC | PRN

## 2011-08-26 NOTE — Telephone Encounter (Signed)
Last filled 07-23-2011

## 2011-08-26 NOTE — Telephone Encounter (Signed)
Rx called in as directed.   

## 2011-09-10 ENCOUNTER — Encounter: Payer: Self-pay | Admitting: Cardiovascular Disease

## 2011-09-10 ENCOUNTER — Ambulatory Visit (INDEPENDENT_AMBULATORY_CARE_PROVIDER_SITE_OTHER): Payer: BC Managed Care – PPO | Admitting: Cardiovascular Disease

## 2011-09-10 VITALS — BP 102/58 | HR 55 | Ht 60.0 in | Wt 177.0 lb

## 2011-09-10 DIAGNOSIS — I2589 Other forms of chronic ischemic heart disease: Secondary | ICD-10-CM

## 2011-09-10 DIAGNOSIS — I255 Ischemic cardiomyopathy: Secondary | ICD-10-CM

## 2011-09-10 DIAGNOSIS — I5022 Chronic systolic (congestive) heart failure: Secondary | ICD-10-CM

## 2011-09-10 DIAGNOSIS — I2510 Atherosclerotic heart disease of native coronary artery without angina pectoris: Secondary | ICD-10-CM

## 2011-09-10 NOTE — Assessment & Plan Note (Signed)
Volume status is good. Continue current meds. He will follow his weight every day and take extra lasix if needed.

## 2011-09-10 NOTE — Progress Notes (Signed)
History of Present Illness: 71 yo WM with history of ICM s/p ICD, CAD s/p CABG, hyperlipidemia here today for routine cardiac follow up. Lipids well controlled in March 2011. Echo 8/11 with EF of 20-25%. LV was dilated. His ICD is followed by Dr. Sharrell Ku  He has been doing well. No complaints.  No ICD events. No orthopnea, PND, lower extremity edema. His weight is stable. He has been working hard in the yard.   Primary Care Physician: Kerby Nora  Last Lipid Profile:  Lipid Panel     Component Value Date/Time   CHOL 114 06/03/2011 0846   TRIG 62.0 06/03/2011 0846   HDL 45.00 06/03/2011 0846   CHOLHDL 3 06/03/2011 0846   VLDL 12.4 06/03/2011 0846   LDLCALC 57 06/03/2011 0846     Past Medical History  Diagnosis Date  . Chronic systolic heart failure   . Cardiomyopathy, ischemic   . CAD (coronary artery disease)   . HLD (hyperlipidemia)   . Herpes zoster   . Gout     Past Surgical History  Procedure Date  . Appendectomy   . Coronary artery bypass graft     ? valve repair 2007 Dr. Barry Dienes  . Cardiac defibrillator placement     Dr. Ladona Ridgel    Current Outpatient Prescriptions  Medication Sig Dispense Refill  . ALPHAGAN P 0.1 % SOLN 1 drop twice  a day left eye      . ALPRAZolam (XANAX) 1 MG tablet Take 1 tablet (1 mg total) by mouth daily as needed.  30 tablet  0  . aspirin 81 MG tablet Take 81 mg by mouth daily.        . carvedilol (COREG) 6.25 MG tablet Take 1 tablet (6.25 mg total) by mouth 2 (two) times daily with a meal.  180 tablet  3  . fluticasone (FLONASE) 50 MCG/ACT nasal spray Place 2 sprays into the nose daily.  16 g  6  . furosemide (LASIX) 20 MG tablet TAKE 1 TABLET BY MOUTH ONCE A DAY  90 tablet  3  . lisinopril (PRINIVIL,ZESTRIL) 10 MG tablet TAKE 1 TABLET BY MOUTH ONCE A DAY  90 tablet  3  . loteprednol (LOTEMAX) 0.2 % SUSP 2 drops daily.       . niacin (NIASPAN) 500 MG CR tablet Take 500 mg by mouth at bedtime.        . potassium chloride (K-DUR) 10 MEQ tablet  TAKE 1 TABLET BY MOUTH ONCE A DAY  90 tablet  3  . simvastatin (ZOCOR) 40 MG tablet TAKE 1 TABLET BY MOUTH ONCE A DAY  90 tablet  3    No Known Allergies  History   Social History  . Marital Status: Married    Spouse Name: N/A    Number of Children: 2  . Years of Education: N/A   Occupational History  . Not on file.   Social History Main Topics  . Smoking status: Former Games developer  . Smokeless tobacco: Not on file  . Alcohol Use: 1.2 oz/week    2 Cans of beer per week  . Drug Use: No  . Sexually Active:    Other Topics Concern  . Not on file   Social History Narrative   Exercise: 2 miles walking dailyDiet: Moderate...some fruit and veggies, but eats some meat/potatos.Limited fast food.End of Life: HCPOA wife, no living willFull Code    Family History  Problem Relation Age of Onset  . Cancer Mother  brain cancer  . Heart disease Father     CAD AND CHF    Review of Systems:  As stated in the HPI and otherwise negative.   BP 102/58  Pulse 55  Ht 5' (1.524 m)  Wt 177 lb (80.287 kg)  BMI 34.57 kg/m2  Physical Examination: General: Well developed, well nourished, NAD HEENT: OP clear, mucus membranes moist SKIN: warm, dry. No rashes. Neuro: No focal deficits Musculoskeletal: Muscle strength 5/5 all ext Psychiatric: Mood and affect normal Neck: No JVD, no carotid bruits, no thyromegaly, no lymphadenopathy. Lungs:Clear bilaterally, no wheezes, rhonci, crackles Cardiovascular: Regular rate and rhythm. No murmurs, gallops or rubs. Abdomen:Soft. Bowel sounds present. Non-tender.  Extremities: No lower extremity edema. Pulses are 2 + in the bilateral DP/PT.  EKG: Sinus brady, rate 54 bpm. Poor R wave progression through the precordial leads.

## 2011-09-10 NOTE — Assessment & Plan Note (Addendum)
Stable. Continue current meds. NO chest pain. BP and lipids are well controlled.

## 2011-09-10 NOTE — Assessment & Plan Note (Signed)
No ICD events. He is on good medical therapy. NO changes. Follow up with Dr. Ladona Ridgel as planned.

## 2011-09-10 NOTE — Patient Instructions (Addendum)
Your physician wants you to follow-up in:  12 months.  You will receive a reminder letter in the mail two months in advance. If you don't receive a letter, please call our office to schedule the follow-up appointment.   

## 2011-09-16 ENCOUNTER — Encounter: Payer: BC Managed Care – PPO | Admitting: *Deleted

## 2011-09-21 ENCOUNTER — Encounter: Payer: Self-pay | Admitting: *Deleted

## 2011-09-28 ENCOUNTER — Other Ambulatory Visit: Payer: Self-pay | Admitting: *Deleted

## 2011-09-28 MED ORDER — ALPRAZOLAM 1 MG PO TABS: 1.0000 mg | ORAL_TABLET | Freq: Every day | ORAL | Status: DC | PRN

## 2011-09-28 NOTE — Telephone Encounter (Signed)
Faxed refill request from [facility], last filled 08/26/11.

## 2011-09-28 NOTE — Telephone Encounter (Signed)
rx called into pharmacy

## 2011-10-13 ENCOUNTER — Encounter: Payer: Self-pay | Admitting: *Deleted

## 2011-10-26 ENCOUNTER — Other Ambulatory Visit: Payer: Self-pay | Admitting: Family Medicine

## 2011-10-27 ENCOUNTER — Other Ambulatory Visit: Payer: Self-pay | Admitting: *Deleted

## 2011-10-27 ENCOUNTER — Encounter: Payer: BC Managed Care – PPO | Admitting: Internal Medicine

## 2011-10-28 ENCOUNTER — Other Ambulatory Visit: Payer: Self-pay

## 2011-10-28 MED ORDER — ALPRAZOLAM 1 MG PO TABS: 1.0000 mg | ORAL_TABLET | Freq: Every day | ORAL | Status: DC | PRN

## 2011-10-28 NOTE — Telephone Encounter (Signed)
Medication phoned to pharmacy.  

## 2011-10-28 NOTE — Telephone Encounter (Signed)
Pt upset because alprazolam was not called in at same time as Furosemide. Explained to pt Alprazolam had to be approved by physician and the request takes longer. Pt said OK . I verified alprazolam ready for pick up at [facility] now and pt notified while on phone.

## 2011-11-05 ENCOUNTER — Ambulatory Visit (INDEPENDENT_AMBULATORY_CARE_PROVIDER_SITE_OTHER): Payer: BC Managed Care – PPO | Admitting: Internal Medicine

## 2011-11-05 ENCOUNTER — Encounter: Payer: Self-pay | Admitting: Internal Medicine

## 2011-11-05 VITALS — BP 108/63 | HR 59 | Ht 70.0 in | Wt 169.0 lb

## 2011-11-05 DIAGNOSIS — Z95810 Presence of automatic (implantable) cardiac defibrillator: Secondary | ICD-10-CM

## 2011-11-05 DIAGNOSIS — I5022 Chronic systolic (congestive) heart failure: Secondary | ICD-10-CM

## 2011-11-05 LAB — ICD DEVICE OBSERVATION
BATTERY VOLTAGE: 2.85 V
DEV-0020ICD: NEGATIVE
FVT: 0
PACEART VT: 0
RV LEAD IMPEDENCE ICD: 456 Ohm
RV LEAD THRESHOLD: 1 V
TZAT-0001FASTVT: 1
TZAT-0001SLOWVT: 1
TZAT-0001SLOWVT: 2
TZAT-0004FASTVT: 8
TZAT-0005SLOWVT: 84 pct
TZAT-0005SLOWVT: 91 pct
TZAT-0018SLOWVT: NEGATIVE
TZAT-0018SLOWVT: NEGATIVE
TZAT-0019FASTVT: 8 V
TZAT-0019SLOWVT: 8 V
TZAT-0019SLOWVT: 8 V
TZAT-0020FASTVT: 1.6 ms
TZON-0005SLOWVT: 12
TZON-0008FASTVT: 0 ms
TZST-0001FASTVT: 2
TZST-0001FASTVT: 4
TZST-0001FASTVT: 6
TZST-0001SLOWVT: 3
TZST-0001SLOWVT: 5
TZST-0003FASTVT: 25 J
TZST-0003FASTVT: 35 J
TZST-0003FASTVT: 35 J
TZST-0003FASTVT: 35 J
TZST-0003SLOWVT: 35 J

## 2011-11-05 NOTE — Progress Notes (Signed)
HPI [name] returns today for followup. He is a very pleasant 71 year old man with an ischemic cardiomyopathy, chronic systolic heart failure, status post ICD implantation. In the interim, he has done well. He denies chest pain or shortness of breath. He continues to work part time. He denies syncope or peripheral edema. No ICD shocks. No Known Allergies   Current Outpatient Prescriptions  Medication Sig Dispense Refill  . ALPHAGAN P 0.1 % SOLN 1 drop twice  a day left eye      . ALPRAZolam (XANAX) 1 MG tablet Take 1 tablet (1 mg total) by mouth daily as needed.  30 tablet  0  . aspirin 81 MG tablet Take 81 mg by mouth daily.        . carvedilol (COREG) 6.25 MG tablet Take 1 tablet (6.25 mg total) by mouth 2 (two) times daily with a meal.  180 tablet  3  . fluticasone (FLONASE) 50 MCG/ACT nasal spray Place 2 sprays into the nose daily.  16 g  6  . furosemide (LASIX) 20 MG tablet TAKE 1 TABLET BY MOUTH ONCE A DAY  90 tablet  3  . lisinopril (PRINIVIL,ZESTRIL) 10 MG tablet TAKE 1 TABLET BY MOUTH ONCE A DAY  90 tablet  3  . loteprednol (LOTEMAX) 0.2 % SUSP 2 drops daily.       . niacin (NIASPAN) 500 MG CR tablet Take 500 mg by mouth at bedtime.        . potassium chloride (K-DUR) 10 MEQ tablet TAKE 1 TABLET BY MOUTH ONCE A DAY  90 tablet  3  . simvastatin (ZOCOR) 40 MG tablet TAKE 1 TABLET BY MOUTH ONCE A DAY  90 tablet  3     Past Medical History  Diagnosis Date  . Chronic systolic heart failure   . Cardiomyopathy, ischemic   . CAD (coronary artery disease)   . HLD (hyperlipidemia)   . Herpes zoster   . Gout     ROS:   All systems reviewed and negative except as noted in the HPI.   Past Surgical History  Procedure Date  . Appendectomy   . Coronary artery bypass graft     ? valve repair 2007 Dr. Barry Dienes  . Cardiac defibrillator placement     Dr. Ladona Ridgel     Family History  Problem Relation Age of Onset  . Cancer Mother     brain cancer  . Heart disease Father    CAD AND CHF     History   Social History  . Marital Status: Married    Spouse Name: N/A    Number of Children: 2  . Years of Education: N/A   Occupational History  . Not on file.   Social History Main Topics  . Smoking status: Former Games developer  . Smokeless tobacco: Not on file  . Alcohol Use: 1.2 oz/week    2 Cans of beer per week  . Drug Use: No  . Sexually Active:    Other Topics Concern  . Not on file   Social History Narrative   Exercise: 2 miles walking dailyDiet: Moderate...some fruit and veggies, but eats some meat/potatos.Limited fast food.End of Life: HCPOA wife, no living willFull Code     BP 108/63  Pulse 59  Ht 5\' 10"  (1.778 m)  Wt 169 lb (76.658 kg)  BMI 24.25 kg/m2  Physical Exam:  Well appearing 71 year old man, NAD HEENT: Unremarkable Neck:  No JVD, no thyromegally Lungs:  Clear with no wheezes, rales,  or rhonchi. Well-healed ICD incision. HEART:  Regular rate rhythm, no murmurs, no rubs, no clicks Abd:  soft, positive bowel sounds, no organomegally, no rebound, no guarding Ext:  2 plus pulses, no edema, no cyanosis, no clubbing Skin:  No rashes no nodules Neuro:  CN II through XII intact, motor grossly intact  DEVICE  Normal device function.  See PaceArt for details.   Assess/Plan:

## 2011-11-05 NOTE — Assessment & Plan Note (Signed)
His Medtronic single chamber defibrillator is working normally. He does have a 6949-lead which is working normally. We'll plan to recheck in several months.

## 2011-11-05 NOTE — Patient Instructions (Signed)
Your physician wants you to follow-up in: 12 months with Dr Taylor You will receive a reminder letter in the mail two months in advance. If you don't receive a letter, please call our office to schedule the follow-up appointment.   Remote monitoring is used to monitor your Pacemaker of ICD from home. This monitoring reduces the number of office visits required to check your device to one time per year. It allows us to keep an eye on the functioning of your device to ensure it is working properly. You are scheduled for a device check from home on 11/27/12. You may send your transmission at any time that day. If you have a wireless device, the transmission will be sent automatically. After your physician reviews your transmission, you will receive a postcard with your next transmission date.   

## 2011-11-05 NOTE — Assessment & Plan Note (Signed)
His symptoms are currently class 1-2. He will continue his current medical therapy, maintain a low-sodium diet, and continue his current level of physical activity.

## 2011-11-24 ENCOUNTER — Other Ambulatory Visit: Payer: Self-pay | Admitting: *Deleted

## 2011-11-24 NOTE — Telephone Encounter (Signed)
Received faxed refill request from pharmacy. Last office visit 06/11/11. Is it okay to refill medication?

## 2011-11-26 MED ORDER — ALPRAZOLAM 1 MG PO TABS: 1.0000 mg | ORAL_TABLET | Freq: Every day | ORAL | Status: DC | PRN

## 2011-11-26 NOTE — Telephone Encounter (Signed)
rx called into pharmacy

## 2011-12-01 ENCOUNTER — Other Ambulatory Visit: Payer: Self-pay | Admitting: *Deleted

## 2011-12-02 MED ORDER — ALPRAZOLAM 1 MG PO TABS: 1.0000 mg | ORAL_TABLET | Freq: Every day | ORAL | Status: DC | PRN

## 2011-12-02 NOTE — Telephone Encounter (Signed)
rx called to pharmacy 

## 2011-12-27 ENCOUNTER — Other Ambulatory Visit: Payer: Self-pay | Admitting: *Deleted

## 2011-12-28 MED ORDER — ALPRAZOLAM 1 MG PO TABS: 1.0000 mg | ORAL_TABLET | Freq: Every day | ORAL | Status: DC | PRN

## 2012-01-27 ENCOUNTER — Other Ambulatory Visit: Payer: Self-pay | Admitting: *Deleted

## 2012-01-27 MED ORDER — ALPRAZOLAM 1 MG PO TABS: 1.0000 mg | ORAL_TABLET | Freq: Every day | ORAL | Status: DC | PRN

## 2012-01-27 NOTE — Telephone Encounter (Signed)
OK to refill? Last filled 12/27/11 and last OV 06/11/11.

## 2012-01-27 NOTE — Telephone Encounter (Signed)
rx called to pharmacy 

## 2012-02-07 ENCOUNTER — Ambulatory Visit (INDEPENDENT_AMBULATORY_CARE_PROVIDER_SITE_OTHER): Payer: BC Managed Care – PPO | Admitting: *Deleted

## 2012-02-07 DIAGNOSIS — I2589 Other forms of chronic ischemic heart disease: Secondary | ICD-10-CM

## 2012-02-07 DIAGNOSIS — Z95810 Presence of automatic (implantable) cardiac defibrillator: Secondary | ICD-10-CM

## 2012-02-07 LAB — REMOTE ICD DEVICE
BATTERY VOLTAGE: 2.78 V
CHARGE TIME: 9.92 s
TZAT-0001SLOWVT: 1
TZAT-0001SLOWVT: 2
TZAT-0004FASTVT: 8
TZAT-0005SLOWVT: 84 pct
TZAT-0005SLOWVT: 91 pct
TZAT-0011SLOWVT: 10 ms
TZAT-0011SLOWVT: 10 ms
TZAT-0012FASTVT: 200 ms
TZAT-0019SLOWVT: 8 V
TZAT-0019SLOWVT: 8 V
TZAT-0020FASTVT: 1.6 ms
TZON-0003FASTVT: 240 ms
TZON-0011AFLUTTER: 70
TZST-0001FASTVT: 2
TZST-0001FASTVT: 4
TZST-0001SLOWVT: 3
TZST-0001SLOWVT: 4
TZST-0003FASTVT: 25 J
TZST-0003FASTVT: 35 J
TZST-0003FASTVT: 35 J
TZST-0003SLOWVT: 15 J
TZST-0003SLOWVT: 35 J

## 2012-02-09 ENCOUNTER — Encounter: Payer: Self-pay | Admitting: *Deleted

## 2012-02-16 ENCOUNTER — Encounter: Payer: Self-pay | Admitting: Internal Medicine

## 2012-02-24 ENCOUNTER — Other Ambulatory Visit: Payer: Self-pay | Admitting: *Deleted

## 2012-02-25 MED ORDER — ALPRAZOLAM 1 MG PO TABS: 1.0000 mg | ORAL_TABLET | Freq: Every day | ORAL | Status: DC | PRN

## 2012-02-29 NOTE — Telephone Encounter (Signed)
Pt left v/m requesting refill alprazolam at [facility]; pt said waiting over one week. Spoke with [facility] [employer] and they did not get call in on alprazolam .Medication phoned to pharmacy as instructed. Pt did not leave contact # but left v/m on pt cell to ck with pharmacy and apologized for delay

## 2012-03-27 ENCOUNTER — Other Ambulatory Visit: Payer: Self-pay | Admitting: Family Medicine

## 2012-03-27 ENCOUNTER — Other Ambulatory Visit: Payer: Self-pay

## 2012-03-27 NOTE — Telephone Encounter (Signed)
[facility] faxed refill request for alprazolam.Please advise.

## 2012-03-27 NOTE — Telephone Encounter (Signed)
[facility] [place] request refill K Dur 10 meq # 90 x 0 and simvastatin 40 mg # 90 x 0. Pt already scheduled CPX 06/13/12.

## 2012-03-28 MED ORDER — ALPRAZOLAM 1 MG PO TABS
1.0000 mg | ORAL_TABLET | Freq: Every day | ORAL | Status: DC | PRN
Start: 2012-03-27 — End: 2012-04-24

## 2012-03-28 NOTE — Telephone Encounter (Signed)
Medicine called to [facility].

## 2012-04-24 ENCOUNTER — Other Ambulatory Visit: Payer: Self-pay | Admitting: Family Medicine

## 2012-04-24 NOTE — Telephone Encounter (Signed)
Refill request for Alprazolam.  Last filled 03/27/12.

## 2012-04-25 MED ORDER — ALPRAZOLAM 1 MG PO TABS: 1.0000 mg | ORAL_TABLET | Freq: Every day | ORAL | Status: DC | PRN

## 2012-04-25 NOTE — Telephone Encounter (Signed)
Called in to [facility] pharmacy

## 2012-05-08 ENCOUNTER — Encounter: Payer: BC Managed Care – PPO | Admitting: *Deleted

## 2012-05-17 ENCOUNTER — Encounter: Payer: Self-pay | Admitting: *Deleted

## 2012-05-29 ENCOUNTER — Other Ambulatory Visit: Payer: Self-pay | Admitting: *Deleted

## 2012-05-29 NOTE — Telephone Encounter (Signed)
Last filled 04/25/12 

## 2012-05-30 MED ORDER — ALPRAZOLAM 1 MG PO TABS: 1.0000 mg | ORAL_TABLET | Freq: Every day | ORAL | Status: DC | PRN

## 2012-05-30 NOTE — Telephone Encounter (Signed)
rx called to pharmacy 

## 2012-06-01 ENCOUNTER — Telehealth: Payer: Self-pay | Admitting: Family Medicine

## 2012-06-01 DIAGNOSIS — M109 Gout, unspecified: Secondary | ICD-10-CM

## 2012-06-01 DIAGNOSIS — E785 Hyperlipidemia, unspecified: Secondary | ICD-10-CM

## 2012-06-01 DIAGNOSIS — R7309 Other abnormal glucose: Secondary | ICD-10-CM

## 2012-06-01 NOTE — Telephone Encounter (Signed)
Message copied by Excell Seltzer on Thu Jun 01, 2012  5:02 PM ------      Message from: Baldomero Lamy      Created: Thu May 25, 2012 12:52 PM      Regarding: Cpx labs 5/14 Wed       Please order  future cpx labs for pt's upcoming lab appt.      Thanks      Tasha       ------

## 2012-06-07 ENCOUNTER — Other Ambulatory Visit (INDEPENDENT_AMBULATORY_CARE_PROVIDER_SITE_OTHER): Payer: BC Managed Care – PPO

## 2012-06-07 DIAGNOSIS — E785 Hyperlipidemia, unspecified: Secondary | ICD-10-CM

## 2012-06-07 DIAGNOSIS — M109 Gout, unspecified: Secondary | ICD-10-CM

## 2012-06-07 DIAGNOSIS — R7309 Other abnormal glucose: Secondary | ICD-10-CM

## 2012-06-07 LAB — COMPREHENSIVE METABOLIC PANEL
AST: 48 U/L — ABNORMAL HIGH (ref 0–37)
Alkaline Phosphatase: 48 U/L (ref 39–117)
BUN: 21 mg/dL (ref 6–23)
Calcium: 9.2 mg/dL (ref 8.4–10.5)
Creatinine, Ser: 1.2 mg/dL (ref 0.4–1.5)
Glucose, Bld: 92 mg/dL (ref 70–99)

## 2012-06-07 LAB — LIPID PANEL
Cholesterol: 103 mg/dL (ref 0–200)
HDL: 36 mg/dL — ABNORMAL LOW (ref >39.00)
LDL Cholesterol: 51 mg/dL (ref 0–99)
Total CHOL/HDL Ratio: 3
Triglycerides: 78 mg/dL (ref 0.0–149.0)

## 2012-06-12 ENCOUNTER — Encounter: Payer: Self-pay | Admitting: Radiology

## 2012-06-13 ENCOUNTER — Ambulatory Visit (INDEPENDENT_AMBULATORY_CARE_PROVIDER_SITE_OTHER): Payer: BC Managed Care – PPO | Admitting: Family Medicine

## 2012-06-13 ENCOUNTER — Encounter: Payer: Self-pay | Admitting: Family Medicine

## 2012-06-13 VITALS — BP 120/64 | HR 63 | Temp 98.1°F | Ht 70.0 in | Wt 171.5 lb

## 2012-06-13 DIAGNOSIS — R7401 Elevation of levels of liver transaminase levels: Secondary | ICD-10-CM | POA: Insufficient documentation

## 2012-06-13 DIAGNOSIS — Z86010 Personal history of colonic polyps: Secondary | ICD-10-CM

## 2012-06-13 DIAGNOSIS — R7309 Other abnormal glucose: Secondary | ICD-10-CM

## 2012-06-13 DIAGNOSIS — E785 Hyperlipidemia, unspecified: Secondary | ICD-10-CM

## 2012-06-13 DIAGNOSIS — Z0000 Encounter for general adult medical examination without abnormal findings: Secondary | ICD-10-CM

## 2012-06-13 DIAGNOSIS — M109 Gout, unspecified: Secondary | ICD-10-CM

## 2012-06-13 MED ORDER — FUROSEMIDE 20 MG PO TABS: ORAL_TABLET | ORAL | Status: DC

## 2012-06-13 MED ORDER — POTASSIUM CHLORIDE ER 10 MEQ PO TBCR: EXTENDED_RELEASE_TABLET | ORAL | Status: DC

## 2012-06-13 MED ORDER — ALLOPURINOL 100 MG PO TABS: 100.0000 mg | ORAL_TABLET | Freq: Every day | ORAL | Status: DC

## 2012-06-13 NOTE — Progress Notes (Signed)
The patient is here for annual wellness exam and preventative care.   High cholesterol: Well controlled on simvastain 40 mg daily.  Lab Results  Component Value Date   CHOL 103 06/07/2012   HDL 36.00* 06/07/2012   LDLCALC 51 06/07/2012   TRIG 78.0 06/07/2012   CHOLHDL 3 06/07/2012    CAD, CHF: Seen cards [facility] 08/2011..stable on current meds  history of ICM s/p ICD, CAD s/p CABG  Followed by Dr. Ladona Ridgel 11/2011 He has been mowing his grass without chest pain or SOB. No ICD events. No orthopnea, PND, lower ext edema.   Wt Readings from Last 3 Encounters:  06/13/12 171 lb 8 oz (77.792 kg)  11/05/11 169 lb (76.658 kg)  09/10/11 177 lb (80.287 kg)     Prediabetes: improved, FBS 92 Limiting sweets some.  Walking 2 miles every morning.  Work part-time at [employer].   Anxiety, well controlled..using  Alprazolam 1 mg nightly for insomnia.  Main issue is that coreg "jacks him up".  No daytime anxiety, no depression.  No SI.   Gout: no flares in last year.  Uric acid 8.7, lower by 1 point in a year.  Review of Systems  Constitutional: Negative for fever, fatigue and unexpected weight change.  HENT: Negative for ear pain, congestion, sore throat, rhinorrhea, trouble swallowing and postnasal drip.  Eyes: Negative for pain.  Respiratory: Negative for cough, stable shortness of breath and no wheezing.  Cardiovascular: Negative for chest pain, palpitations and leg swelling.  Gastrointestinal: Negative for nausea, abdominal pain, diarrhea, constipation and blood in stool.  Genitourinary: Negative for dysuria, urgency, hematuria, discharge, penile swelling, scrotal swelling, difficulty urinating, penile pain and testicular pain.  Skin: Negative for rash.  Neurological: Negative for syncope, weakness, light-headedness, numbness and headaches.  Psychiatric/Behavioral: Negative for behavioral problems and dysphoric mood. The patient is not nervous/anxious.  Objective:   Physical Exam   Constitutional: He appears well-developed and well-nourished. Non-toxic appearance. He does not appear ill. No distress.  HENT:  Head: Normocephalic and atraumatic.  Right Ear: Hearing, tympanic membrane, external ear and ear canal normal.  Left Ear: Hearing, tympanic membrane, external ear and ear canal normal.  Nose: Nose normal.  Mouth/Throat: Uvula is midline, oropharynx is clear and moist and mucous membranes are normal.  Eyes: Conjunctivae, EOM and lids are normal. Pupils are equal, round, and reactive to light. No foreign bodies found.  Neck: Trachea normal, normal range of motion and phonation normal. Neck supple. Carotid bruit is not present. No mass and no thyromegaly present.  Cardiovascular: Normal rate, regular rhythm, S1 normal, S2 normal, intact distal pulses and normal pulses. Exam reveals no gallop.  No murmur heard.  Pulmonary/Chest: Breath sounds normal. He has no wheezes. He has no rhonchi. He has no rales.  Abdominal: Soft. Normal appearance and bowel sounds are normal. There is no hepatosplenomegaly. There is no tenderness. There is no rebound, no guarding and no CVA tenderness. No hernia. Hernia confirmed negative in the right inguinal area and confirmed negative in the left inguinal area.  Genitourinary: Prostate normal, testes normal and penis normal. Rectal exam shows no external hemorrhoid, no internal hemorrhoid, no fissure, no mass, no tenderness and anal tone normal. Prostate is not enlarged and not tender. Right testis shows no mass and no tenderness. Left testis shows no mass and no tenderness. No paraphimosis or penile tenderness.  Lymphadenopathy:  He has no cervical adenopathy.  Right: No inguinal adenopathy present.  Left: No inguinal adenopathy present.  Neurological:  He is alert. He has normal strength and normal reflexes. No cranial nerve deficit or sensory deficit. Gait normal.  Skin: Skin is warm, dry and intact. No rash noted.  Psychiatric: He has a  normal mood and affect. His speech is normal and behavior is normal. Judgment normal.  Assessment & Plan:   Complete Physical Exam  The patient's preventative maintenance and recommended screening tests for an annual wellness exam were reviewed in full today.  Brought up to date unless services declined.  Counselled on the importance of diet, exercise, and its role in overall health and mortality.  The patient's FH and SH was reviewed, including their home life, tobacco status, and drug and alcohol status.   Vaccines: PNA Td uptodate,  Shingles: not interested. Colon: 04/21/2006.. Polyp was 55 cm! Resected and recommended repeat in 3 years... Pt overdue. Prostate: not indicated, pt agreeable.

## 2012-06-13 NOTE — Patient Instructions (Addendum)
Decrease uric acid foods.  Start trial of allopurinol low dose daily.  Return in 4 weeks for uric acid check.  Stop at front desk to set up referral to GI MD for colonoscopy. Stop by lab on your way out.

## 2012-06-13 NOTE — Assessment & Plan Note (Signed)
Well controlled. Continue current medication.  

## 2012-06-13 NOTE — Assessment & Plan Note (Signed)
Will start allopurinol trial and low uric acid diet.. Return in 4 weeks to recheck.

## 2012-06-26 ENCOUNTER — Other Ambulatory Visit: Payer: Self-pay | Admitting: Family Medicine

## 2012-06-27 ENCOUNTER — Other Ambulatory Visit: Payer: Self-pay | Admitting: *Deleted

## 2012-06-27 MED ORDER — ALPRAZOLAM 1 MG PO TABS: 1.0000 mg | ORAL_TABLET | Freq: Every day | ORAL | Status: DC | PRN

## 2012-06-27 NOTE — Telephone Encounter (Signed)
RX CALLED TO PHARMACY  

## 2012-07-05 ENCOUNTER — Other Ambulatory Visit: Payer: Self-pay | Admitting: Internal Medicine

## 2012-07-11 ENCOUNTER — Other Ambulatory Visit (INDEPENDENT_AMBULATORY_CARE_PROVIDER_SITE_OTHER): Payer: BC Managed Care – PPO

## 2012-07-11 DIAGNOSIS — M109 Gout, unspecified: Secondary | ICD-10-CM

## 2012-07-24 ENCOUNTER — Other Ambulatory Visit: Payer: Self-pay | Admitting: *Deleted

## 2012-07-24 MED ORDER — ALPRAZOLAM 1 MG PO TABS: 1.0000 mg | ORAL_TABLET | Freq: Every day | ORAL | Status: DC | PRN

## 2012-07-24 NOTE — Telephone Encounter (Signed)
Last filled 06/27/12

## 2012-07-24 NOTE — Telephone Encounter (Signed)
rx called to pharmacy 

## 2012-07-31 ENCOUNTER — Encounter: Payer: Self-pay | Admitting: Family Medicine

## 2012-08-24 ENCOUNTER — Other Ambulatory Visit: Payer: Self-pay | Admitting: *Deleted

## 2012-08-24 MED ORDER — ALPRAZOLAM 1 MG PO TABS: 1.0000 mg | ORAL_TABLET | Freq: Every day | ORAL | Status: DC | PRN

## 2012-08-24 NOTE — Telephone Encounter (Signed)
rx called into pharmacy

## 2012-09-08 ENCOUNTER — Encounter: Payer: Self-pay | Admitting: Cardiovascular Disease

## 2012-09-08 ENCOUNTER — Ambulatory Visit (INDEPENDENT_AMBULATORY_CARE_PROVIDER_SITE_OTHER): Payer: BC Managed Care – PPO | Admitting: Cardiovascular Disease

## 2012-09-08 ENCOUNTER — Encounter: Payer: Self-pay | Admitting: *Deleted

## 2012-09-08 VITALS — BP 122/76 | HR 59 | Ht 70.0 in | Wt 172.8 lb

## 2012-09-08 DIAGNOSIS — I255 Ischemic cardiomyopathy: Secondary | ICD-10-CM

## 2012-09-08 DIAGNOSIS — I2589 Other forms of chronic ischemic heart disease: Secondary | ICD-10-CM

## 2012-09-08 DIAGNOSIS — I5022 Chronic systolic (congestive) heart failure: Secondary | ICD-10-CM

## 2012-09-08 DIAGNOSIS — I2510 Atherosclerotic heart disease of native coronary artery without angina pectoris: Secondary | ICD-10-CM

## 2012-09-08 NOTE — Telephone Encounter (Signed)
This encounter was created in error - please disregard.

## 2012-09-08 NOTE — Progress Notes (Signed)
History of Present Illness: 72 yo WM with history of ICM s/p ICD, CAD s/p CABG, hyperlipidemia here today for routine cardiac follow up. He underwent 3V CABG (LIMA to LAD, SVG to Diagonal, SVG to Circumflex) in 2007 along with mitral valve ring per Dr. Cornelius Moras. Echo 8/11 with EF of 20-25%. LV was dilated. His ICD is followed by Dr. Sharrell Ku.   He has been doing well. No complaints. No ICD events. No chest pain or SOB. No orthopnea, PND, lower extremity edema. His weight is stable. He has been working hard in the yard.   Primary Care Physician: Ermalene Searing  Last Lipid Profile:Lipid Panel     Component Value Date/Time   CHOL 103 06/07/2012 0848   TRIG 78.0 06/07/2012 0848   HDL 36.00* 06/07/2012 0848   CHOLHDL 3 06/07/2012 0848   VLDL 15.6 06/07/2012 0848   LDLCALC 51 06/07/2012 0848     Past Medical History  Diagnosis Date  . Chronic systolic heart failure   . Cardiomyopathy, ischemic   . CAD (coronary artery disease)   . HLD (hyperlipidemia)   . Herpes zoster   . Gout     Past Surgical History  Procedure Laterality Date  . Appendectomy    . Coronary artery bypass graft      ? valve repair 2007 Dr. Barry Dienes  . Cardiac defibrillator placement      Dr. Ladona Ridgel    Current Outpatient Prescriptions  Medication Sig Dispense Refill  . allopurinol (ZYLOPRIM) 100 MG tablet Take 1 tablet (100 mg total) by mouth daily.  30 tablet  6  . ALPHAGAN P 0.1 % SOLN 1 drop twice  a day left eye      . ALPRAZolam (XANAX) 1 MG tablet Take 1 tablet (1 mg total) by mouth daily as needed.  30 tablet  0  . aspirin 81 MG tablet Take 81 mg by mouth daily.        . carvedilol (COREG) 6.25 MG tablet Take 1 tablet (6.25 mg total) by mouth 2 (two) times daily with a meal.  180 tablet  3  . carvedilol (COREG) 6.25 MG tablet TAKE ONE TABLET BY MOUTH TWICE DAILY WITH MEALS  180 tablet  0  . fluticasone (FLONASE) 50 MCG/ACT nasal spray Place 2 sprays into the nose daily.  16 g  6  . furosemide (LASIX) 20 MG tablet  TAKE 1 TABLET BY MOUTH ONCE A DAY  90 tablet  3  . latanoprost (XALATAN) 0.005 % ophthalmic solution Place 1 drop into the left eye as directed.      Marland Kitchen lisinopril (PRINIVIL,ZESTRIL) 10 MG tablet TAKE 1 TABLET BY MOUTH ONCE A DAY  90 tablet  3  . loteprednol (LOTEMAX) 0.2 % SUSP 2 drops daily.       . niacin (NIASPAN) 500 MG CR tablet Take 500 mg by mouth at bedtime.        . potassium chloride (K-DUR) 10 MEQ tablet TAKE 1 TABLET BY MOUTH ONCE A DAY  90 tablet  3  . simvastatin (ZOCOR) 40 MG tablet TAKE 1 TABLET BY MOUTH ONCE A DAY  90 tablet  0   No current facility-administered medications for this visit.    No Known Allergies  History   Social History  . Marital Status: Married    Spouse Name: N/A    Number of Children: 2  . Years of Education: N/A   Occupational History  . Not on file.   Social History Main Topics  .  Smoking status: Former Games developer  . Smokeless tobacco: Not on file  . Alcohol Use: 1.2 oz/week    2 Cans of beer per week  . Drug Use: No  . Sexual Activity:    Other Topics Concern  . Not on file   Social History Narrative   Exercise: 2 miles walking daily   Diet: Moderate...some fruit and veggies, but eats some meat/potatos.   Limited fast food.   End of Life: HCPOA wife, no living will   Full Code             Family History  Problem Relation Age of Onset  . Cancer Mother     brain cancer  . Heart disease Father     CAD AND CHF    Review of Systems:  As stated in the HPI and otherwise negative.   BP 122/76  Pulse 59  Ht 5\' 10"  (1.778 m)  Wt 172 lb 12.8 oz (78.382 kg)  BMI 24.79 kg/m2  SpO2 99%  Physical Examination: General: Well developed, well nourished, NAD HEENT: OP clear, mucus membranes moist SKIN: warm, dry. No rashes. Neuro: No focal deficits Musculoskeletal: Muscle strength 5/5 all ext Psychiatric: Mood and affect normal Neck: No JVD, no carotid bruits, no thyromegaly, no lymphadenopathy. Lungs:Clear bilaterally, no  wheezes, rhonci, crackles Cardiovascular: Regular rate and rhythm. No murmurs, gallops or rubs. Abdomen:Soft. Bowel sounds present. Non-tender.  Extremities: No lower extremity edema. Pulses are 2 + in the bilateral DP/PT.  EKG: NSR, rate 63 bpm. PVCs.   Assessment and Plan:   1. CAD: Stable. Continue current meds. NO chest pain. BP and lipids are well controlled.       2. CHRONIC SYSTOLIC HEART FAILURE: Volume status is good. Continue current meds. He will follow his weight every day and take extra lasix if needed.     3. ISCHEMIC CARDIOMYOPATHY: No ICD events. He is on good medical therapy. NO changes. Follow up with Dr. Ladona Ridgel as planned. Will repeat echo this fall.

## 2012-09-08 NOTE — Patient Instructions (Addendum)

## 2012-09-11 ENCOUNTER — Ambulatory Visit (HOSPITAL_COMMUNITY): Payer: BC Managed Care – PPO | Attending: Cardiology

## 2012-09-11 DIAGNOSIS — E785 Hyperlipidemia, unspecified: Secondary | ICD-10-CM | POA: Insufficient documentation

## 2012-09-11 DIAGNOSIS — Z87891 Personal history of nicotine dependence: Secondary | ICD-10-CM | POA: Insufficient documentation

## 2012-09-11 DIAGNOSIS — I509 Heart failure, unspecified: Secondary | ICD-10-CM | POA: Insufficient documentation

## 2012-09-11 DIAGNOSIS — I2589 Other forms of chronic ischemic heart disease: Secondary | ICD-10-CM | POA: Insufficient documentation

## 2012-09-11 DIAGNOSIS — I255 Ischemic cardiomyopathy: Secondary | ICD-10-CM

## 2012-09-11 DIAGNOSIS — I2510 Atherosclerotic heart disease of native coronary artery without angina pectoris: Secondary | ICD-10-CM

## 2012-09-11 DIAGNOSIS — I5022 Chronic systolic (congestive) heart failure: Secondary | ICD-10-CM

## 2012-09-11 NOTE — Progress Notes (Signed)
Echocardiogram performed.  

## 2012-09-26 ENCOUNTER — Other Ambulatory Visit: Payer: Self-pay | Admitting: Family Medicine

## 2012-09-28 ENCOUNTER — Other Ambulatory Visit: Payer: Self-pay | Admitting: *Deleted

## 2012-09-28 ENCOUNTER — Other Ambulatory Visit: Payer: Self-pay | Admitting: Internal Medicine

## 2012-09-28 NOTE — Telephone Encounter (Signed)
Last filled 08/24/12 

## 2012-09-29 MED ORDER — ALPRAZOLAM 1 MG PO TABS: 1.0000 mg | ORAL_TABLET | Freq: Every day | ORAL | Status: DC | PRN

## 2012-09-29 NOTE — Telephone Encounter (Signed)
Pt checking on status of alprazolam refill; advised received request but still pending. Pt said [facility] requested refill last week; advised pt do not see request until 09/28/12; apologized to pt.Please advise.

## 2012-09-29 NOTE — Telephone Encounter (Signed)
Called to pharmacy.  Left message for [name] Roberts that prescription has been called in to [facility].

## 2012-10-25 ENCOUNTER — Other Ambulatory Visit: Payer: Self-pay

## 2012-10-25 NOTE — Telephone Encounter (Signed)
Pt left v/m requesting refill alprazolam to [facility]; pt has med to last until 10/27/12. Pt request cb when med refilled.

## 2012-10-26 MED ORDER — ALPRAZOLAM 1 MG PO TABS: 1.0000 mg | ORAL_TABLET | Freq: Every day | ORAL | Status: DC | PRN

## 2012-10-27 NOTE — Telephone Encounter (Signed)
Called to [facility]. 

## 2012-11-10 ENCOUNTER — Encounter: Payer: Self-pay | Admitting: Internal Medicine

## 2012-11-10 ENCOUNTER — Ambulatory Visit (INDEPENDENT_AMBULATORY_CARE_PROVIDER_SITE_OTHER): Payer: Medicare Other | Admitting: Internal Medicine

## 2012-11-10 DIAGNOSIS — I5022 Chronic systolic (congestive) heart failure: Secondary | ICD-10-CM

## 2012-11-10 DIAGNOSIS — Z95810 Presence of automatic (implantable) cardiac defibrillator: Secondary | ICD-10-CM

## 2012-11-10 DIAGNOSIS — I2589 Other forms of chronic ischemic heart disease: Secondary | ICD-10-CM

## 2012-11-10 LAB — ICD DEVICE OBSERVATION
CHARGE TIME: 10.66 s
FVT: 0
RV LEAD AMPLITUDE: 12.2 mv
RV LEAD IMPEDENCE ICD: 448 Ohm
RV LEAD THRESHOLD: 2.5 V
TZAT-0001SLOWVT: 1
TZAT-0004SLOWVT: 6
TZAT-0004SLOWVT: 8
TZAT-0005FASTVT: 88 pct
TZAT-0005SLOWVT: 84 pct
TZAT-0005SLOWVT: 91 pct
TZAT-0011FASTVT: 10 ms
TZAT-0012FASTVT: 200 ms
TZAT-0012SLOWVT: 200 ms
TZAT-0012SLOWVT: 200 ms
TZAT-0013FASTVT: 1
TZAT-0013SLOWVT: 2
TZAT-0013SLOWVT: 2
TZAT-0018FASTVT: NEGATIVE
TZAT-0018SLOWVT: NEGATIVE
TZAT-0018SLOWVT: NEGATIVE
TZAT-0019FASTVT: 8 V
TZAT-0020SLOWVT: 1.6 ms
TZON-0003SLOWVT: 340 ms
TZON-0004SLOWVT: 16
TZON-0005SLOWVT: 12
TZON-0008FASTVT: 0 ms
TZST-0001FASTVT: 3
TZST-0001FASTVT: 5
TZST-0001FASTVT: 6
TZST-0001SLOWVT: 6
TZST-0003FASTVT: 35 J
TZST-0003FASTVT: 35 J
TZST-0003FASTVT: 35 J
TZST-0003SLOWVT: 25 J
TZST-0003SLOWVT: 35 J
VENTRICULAR PACING ICD: 0.9 pct

## 2012-11-10 NOTE — Assessment & Plan Note (Signed)
His Medtronic ICD is working normally. The patient does have a 6949 Medtronic lead and I would anticipate placing a new lead at the time of generator change.

## 2012-11-10 NOTE — Assessment & Plan Note (Signed)
He denies anginal symptoms despite his fairly active lifestyle. No change in medical therapy.

## 2012-11-10 NOTE — Progress Notes (Signed)
HPI [name] Roberts returns today for followup. He is a very pleasant 72 year old man with an ischemic cardiomyopathy, chronic systolic heart failure, status post ICD implantation. In the interim, he has done well. He denies chest pain or shortness of breath. He continues to work part time. He denies syncope or peripheral edema. No ICD shocks.  No Known Allergies   Current Outpatient Prescriptions  Medication Sig Dispense Refill  . allopurinol (ZYLOPRIM) 100 MG tablet Take 1 tablet (100 mg total) by mouth daily.  30 tablet  6  . ALPRAZolam (XANAX) 1 MG tablet Take 1 tablet (1 mg total) by mouth daily as needed.  30 tablet  0  . aspirin 81 MG tablet Take 81 mg by mouth daily.        . carvedilol (COREG) 6.25 MG tablet Take 1 tablet (6.25 mg total) by mouth 2 (two) times daily with a meal.  180 tablet  3  . furosemide (LASIX) 20 MG tablet TAKE 1 TABLET BY MOUTH ONCE A DAY  90 tablet  3  . latanoprost (XALATAN) 0.005 % ophthalmic solution Place 1 drop into the left eye as directed.      Marland Kitchen lisinopril (PRINIVIL,ZESTRIL) 10 MG tablet TAKE 1 TABLET BY MOUTH ONCE A DAY  90 tablet  3  . loteprednol (LOTEMAX) 0.2 % SUSP 2 drops daily.       . niacin (NIASPAN) 500 MG CR tablet Take 500 mg by mouth at bedtime.        . potassium chloride (K-DUR) 10 MEQ tablet TAKE 1 TABLET BY MOUTH ONCE A DAY  90 tablet  3  . simvastatin (ZOCOR) 40 MG tablet TAKE 1 TABLET BY MOUTH ONCE A DAY  90 tablet  1  . fluticasone (FLONASE) 50 MCG/ACT nasal spray Place 2 sprays into the nose daily.  16 g  6   No current facility-administered medications for this visit.     Past Medical History  Diagnosis Date  . Chronic systolic heart failure   . Cardiomyopathy, ischemic   . CAD (coronary artery disease)   . HLD (hyperlipidemia)   . Herpes zoster   . Gout     ROS:   All systems reviewed and negative except as noted in the HPI.   Past Surgical History  Procedure Laterality Date  . Appendectomy    . Coronary artery  bypass graft      ? valve repair 2007 Dr. Barry Dienes  . Cardiac defibrillator placement      Dr. Ladona Ridgel     Family History  Problem Relation Age of Onset  . Cancer Mother     brain cancer  . Heart disease Father     CAD AND CHF     History   Social History  . Marital Status: Married    Spouse Name: N/A    Number of Children: 2  . Years of Education: N/A   Occupational History  . Not on file.   Social History Main Topics  . Smoking status: Former Games developer  . Smokeless tobacco: Not on file  . Alcohol Use: 1.2 oz/week    2 Cans of beer per week  . Drug Use: No  . Sexual Activity:    Other Topics Concern  . Not on file   Social History Narrative   Exercise: 2 miles walking daily   Diet: Moderate...some fruit and veggies, but eats some meat/potatos.   Limited fast food.   End of Life: HCPOA wife, no living will  Full Code              There were no vitals taken for this visit.  Physical Exam:  Well appearing 72 year old man, NAD HEENT: Unremarkable Neck:  6 cm JVD, no thyromegally Lungs:  Clear with no wheezes, rales, or rhonchi. Well-healed ICD incision. HEART:  Regular rate rhythm, no murmurs, no rubs, no clicks Abd:  soft, positive bowel sounds, no organomegally, no rebound, no guarding Ext:  2 plus pulses, no edema, no cyanosis, no clubbing Skin:  No rashes no nodules Neuro:  CN II through XII intact, motor grossly intact  DEVICE  Normal device function.  See PaceArt for details.   Assess/Plan:

## 2012-11-10 NOTE — Patient Instructions (Signed)
Your physician wants you to follow-up in: 12 months with Dr Taylor You will receive a reminder letter in the mail two months in advance. If you don't receive a letter, please call our office to schedule the follow-up appointment.   Remote monitoring is used to monitor your Pacemaker orICD from home. This monitoring reduces the number of office visits required to check your device to one time per year. It allows us to keep an eye on the functioning of your device to ensure it is working properly. You are scheduled for a device check from home on 02/12/13. You may send your transmission at any time that day. If you have a wireless device, the transmission will be sent automatically. After your physician reviews your transmission, you will receive a postcard with your next transmission date.   

## 2012-11-10 NOTE — Assessment & Plan Note (Signed)
His chronic systolic heart failure is class IIA. He will continue his current medical therapy, and maintain a low-sodium diet.

## 2012-11-21 ENCOUNTER — Encounter: Payer: Self-pay | Admitting: Internal Medicine

## 2012-11-24 ENCOUNTER — Other Ambulatory Visit: Payer: Self-pay | Admitting: Family Medicine

## 2012-11-24 MED ORDER — ALPRAZOLAM 1 MG PO TABS: 1.0000 mg | ORAL_TABLET | Freq: Every day | ORAL | Status: DC | PRN

## 2012-11-27 NOTE — Telephone Encounter (Signed)
Phoned in to pharmacy. 

## 2012-12-12 ENCOUNTER — Ambulatory Visit (INDEPENDENT_AMBULATORY_CARE_PROVIDER_SITE_OTHER): Payer: Medicare Other

## 2012-12-12 DIAGNOSIS — Z23 Encounter for immunization: Secondary | ICD-10-CM

## 2012-12-27 ENCOUNTER — Other Ambulatory Visit: Payer: Self-pay | Admitting: Internal Medicine

## 2012-12-27 ENCOUNTER — Other Ambulatory Visit: Payer: Self-pay

## 2012-12-27 NOTE — Telephone Encounter (Signed)
Pt left v/m that he requested refill of alprazolam from [facility] on 12/25/12. I do not see where alprazolam refill was received.Please advise about refill.

## 2012-12-28 MED ORDER — ALPRAZOLAM 1 MG PO TABS: 1.0000 mg | ORAL_TABLET | Freq: Every day | ORAL | Status: DC | PRN

## 2012-12-28 NOTE — Telephone Encounter (Signed)
Pt left v/m requesting status of refill; spoke with donna at [facility] pharmacy and alprazolam should be ready around 1 hr.(4:30 pm). Pt notified via telephone v/m.

## 2012-12-28 NOTE — Telephone Encounter (Signed)
Called to [facility]. 

## 2013-01-24 ENCOUNTER — Other Ambulatory Visit: Payer: Self-pay | Admitting: Family Medicine

## 2013-01-24 MED ORDER — ALPRAZOLAM 1 MG PO TABS: 1.0000 mg | ORAL_TABLET | Freq: Every day | ORAL | Status: DC | PRN

## 2013-01-24 NOTE — Telephone Encounter (Signed)
Pt left v/m requesting refill allopurinol and alprazolam to [facility] on [address].Please advise.

## 2013-01-26 NOTE — Telephone Encounter (Signed)
Rx called in as directed.   

## 2013-02-12 ENCOUNTER — Ambulatory Visit (INDEPENDENT_AMBULATORY_CARE_PROVIDER_SITE_OTHER): Payer: Medicare HMO | Admitting: *Deleted

## 2013-02-12 DIAGNOSIS — I2589 Other forms of chronic ischemic heart disease: Secondary | ICD-10-CM

## 2013-02-13 LAB — MDC_IDC_ENUM_SESS_TYPE_REMOTE
Battery Voltage: 2.64 V
Date Time Interrogation Session: 20150119221300
HIGH POWER IMPEDANCE MEASURED VALUE: 38 Ohm
Lead Channel Impedance Value: 456 Ohm
Lead Channel Sensing Intrinsic Amplitude: 11.3 mV
Lead Channel Setting Pacing Amplitude: 2.5 V
Lead Channel Setting Pacing Pulse Width: 0.4 ms
Lead Channel Setting Sensing Sensitivity: 0.3 mV
MDC IDC SET ZONE DETECTION INTERVAL: 240 ms
MDC IDC SET ZONE DETECTION INTERVAL: 340 ms
MDC IDC STAT BRADY RV PERCENT PACED: 2 %
Zone Setting Detection Interval: 300 ms

## 2013-02-20 ENCOUNTER — Encounter: Payer: Self-pay | Admitting: *Deleted

## 2013-02-22 ENCOUNTER — Other Ambulatory Visit: Payer: Self-pay | Admitting: Family Medicine

## 2013-02-22 MED ORDER — ALPRAZOLAM 1 MG PO TABS: 1.0000 mg | ORAL_TABLET | Freq: Every day | ORAL | Status: DC | PRN

## 2013-02-22 NOTE — Telephone Encounter (Signed)
Last filled 01/26/13

## 2013-02-23 NOTE — Telephone Encounter (Signed)
Called to [facility].

## 2013-03-12 ENCOUNTER — Encounter: Payer: Self-pay | Admitting: Internal Medicine

## 2013-03-19 ENCOUNTER — Ambulatory Visit (INDEPENDENT_AMBULATORY_CARE_PROVIDER_SITE_OTHER): Payer: Medicare HMO | Admitting: *Deleted

## 2013-03-19 DIAGNOSIS — I428 Other cardiomyopathies: Secondary | ICD-10-CM

## 2013-03-19 LAB — MDC_IDC_ENUM_SESS_TYPE_REMOTE
Brady Statistic RV Percent Paced: 2 %
HighPow Impedance: 38 Ohm
Lead Channel Impedance Value: 448 Ohm
Lead Channel Sensing Intrinsic Amplitude: 10.2 mV
Lead Channel Setting Pacing Amplitude: 2.5 V
Lead Channel Setting Sensing Sensitivity: 0.3 mV
MDC IDC MSMT BATTERY VOLTAGE: 2.64 V
MDC IDC SESS DTM: 20150223144009
MDC IDC SET LEADCHNL RV PACING PULSEWIDTH: 0.4 ms
MDC IDC SET ZONE DETECTION INTERVAL: 300 ms
Zone Setting Detection Interval: 240 ms
Zone Setting Detection Interval: 340 ms

## 2013-03-26 ENCOUNTER — Encounter: Payer: Self-pay | Admitting: *Deleted

## 2013-03-26 ENCOUNTER — Other Ambulatory Visit: Payer: Self-pay | Admitting: *Deleted

## 2013-03-26 ENCOUNTER — Telehealth: Payer: Self-pay | Admitting: Family Medicine

## 2013-03-26 MED ORDER — ALPRAZOLAM 1 MG PO TABS: 1.0000 mg | ORAL_TABLET | Freq: Every day | ORAL | Status: DC | PRN

## 2013-03-26 NOTE — Telephone Encounter (Signed)
Called to [facility]. 

## 2013-03-26 NOTE — Telephone Encounter (Signed)
Last office visit 06/13/2012.  Last refilled 02/22/2013 for #30.

## 2013-03-29 NOTE — Telephone Encounter (Signed)
Pt said when he picked up Simvastatin it was 20 mg not 40 mg with same directions take one daily; I advised pt Simvastatin 40 mg was sent to [facility]; spoke with Clydie Braun at [facility] and she pulled hard copy; our office did send Simvastatin 40 mg correctly but was misfilled at [facility] for simvastatin 20 mg taking one daily. Clydie Braun said if pt brought pills back she would give correct med. Pt voiced understanding. Pt has not taken the Simvastatin 20 mg yet.

## 2013-03-29 NOTE — Telephone Encounter (Signed)
Noted  

## 2013-04-02 ENCOUNTER — Encounter: Payer: Self-pay | Admitting: Internal Medicine

## 2013-04-19 ENCOUNTER — Ambulatory Visit (INDEPENDENT_AMBULATORY_CARE_PROVIDER_SITE_OTHER): Payer: Medicare HMO | Admitting: *Deleted

## 2013-04-19 DIAGNOSIS — I5022 Chronic systolic (congestive) heart failure: Secondary | ICD-10-CM

## 2013-04-19 DIAGNOSIS — I428 Other cardiomyopathies: Secondary | ICD-10-CM

## 2013-04-19 LAB — MDC_IDC_ENUM_SESS_TYPE_REMOTE
Battery Voltage: 2.64 V
Brady Statistic RV Percent Paced: 2 %
Date Time Interrogation Session: 20150326175000
HighPow Impedance: 38 Ohm
Lead Channel Sensing Intrinsic Amplitude: 11 mV
Lead Channel Setting Pacing Amplitude: 2.5 V
MDC IDC MSMT LEADCHNL RV IMPEDANCE VALUE: 464 Ohm
MDC IDC SET LEADCHNL RV PACING PULSEWIDTH: 0.4 ms
MDC IDC SET LEADCHNL RV SENSING SENSITIVITY: 0.3 mV
Zone Setting Detection Interval: 240 ms
Zone Setting Detection Interval: 300 ms
Zone Setting Detection Interval: 340 ms

## 2013-04-24 ENCOUNTER — Other Ambulatory Visit: Payer: Self-pay | Admitting: Family Medicine

## 2013-04-24 ENCOUNTER — Other Ambulatory Visit: Payer: Self-pay | Admitting: *Deleted

## 2013-04-24 MED ORDER — ALPRAZOLAM 1 MG PO TABS: 1.0000 mg | ORAL_TABLET | Freq: Every day | ORAL | Status: DC | PRN

## 2013-04-24 NOTE — Telephone Encounter (Signed)
Called to [facility]. 

## 2013-04-24 NOTE — Telephone Encounter (Signed)
Last office visit 06/13/2012.  Last refilled 03/26/2013 for #30.  Ok to refill?

## 2013-05-07 NOTE — Addendum Note (Signed)
Addended by: Sebastian Ache on: 05/07/2013 12:33 PM   Modules accepted: Level of Service

## 2013-05-10 ENCOUNTER — Encounter: Payer: Self-pay | Admitting: *Deleted

## 2013-05-21 ENCOUNTER — Ambulatory Visit (INDEPENDENT_AMBULATORY_CARE_PROVIDER_SITE_OTHER): Payer: Medicare HMO | Admitting: *Deleted

## 2013-05-21 ENCOUNTER — Encounter: Payer: Self-pay | Admitting: Internal Medicine

## 2013-05-21 DIAGNOSIS — I428 Other cardiomyopathies: Secondary | ICD-10-CM

## 2013-05-22 ENCOUNTER — Encounter: Payer: Self-pay | Admitting: Internal Medicine

## 2013-05-25 ENCOUNTER — Other Ambulatory Visit: Payer: Self-pay | Admitting: Family Medicine

## 2013-05-26 LAB — MDC_IDC_ENUM_SESS_TYPE_REMOTE
HIGH POWER IMPEDANCE MEASURED VALUE: 38 Ohm
Lead Channel Impedance Value: 456 Ohm
Lead Channel Sensing Intrinsic Amplitude: 10.8 mV
Lead Channel Setting Pacing Amplitude: 2.5 V
Lead Channel Setting Pacing Pulse Width: 0.4 ms
Lead Channel Setting Sensing Sensitivity: 0.3 mV
MDC IDC MSMT BATTERY VOLTAGE: 2.64 V
MDC IDC SESS DTM: 20150427164500
MDC IDC SET ZONE DETECTION INTERVAL: 240 ms
MDC IDC STAT BRADY RV PERCENT PACED: 2 %
Zone Setting Detection Interval: 300 ms
Zone Setting Detection Interval: 340 ms

## 2013-05-28 ENCOUNTER — Other Ambulatory Visit: Payer: Self-pay | Admitting: *Deleted

## 2013-05-28 MED ORDER — ALPRAZOLAM 1 MG PO TABS: 1.0000 mg | ORAL_TABLET | Freq: Every day | ORAL | Status: DC | PRN

## 2013-05-28 NOTE — Telephone Encounter (Signed)
Last office visit 06/13/2012.  CPX scheduled for 06/15/2013.  Last refilled 04/24/2013 for #30.  Ok to refill?

## 2013-05-29 NOTE — Telephone Encounter (Signed)
Called to [facility].

## 2013-06-07 ENCOUNTER — Encounter: Payer: Self-pay | Admitting: Cardiology

## 2013-06-10 ENCOUNTER — Telehealth: Payer: Self-pay | Admitting: Family Medicine

## 2013-06-10 DIAGNOSIS — M109 Gout, unspecified: Secondary | ICD-10-CM

## 2013-06-10 DIAGNOSIS — E785 Hyperlipidemia, unspecified: Secondary | ICD-10-CM

## 2013-06-10 NOTE — Telephone Encounter (Signed)
Message copied by Excell Seltzer on Sun Jun 10, 2013 10:03 PM ------      Message from: Alvina Chou      Created: Wed Jun 06, 2013  3:57 PM      Regarding: Lab orders for Monday,5.18.15       Patient is scheduled for CPX labs, please order future labs, Thanks , Terri       ------

## 2013-06-11 ENCOUNTER — Encounter: Payer: Self-pay | Admitting: Radiology

## 2013-06-11 ENCOUNTER — Other Ambulatory Visit (INDEPENDENT_AMBULATORY_CARE_PROVIDER_SITE_OTHER): Payer: Medicare HMO

## 2013-06-11 DIAGNOSIS — M109 Gout, unspecified: Secondary | ICD-10-CM

## 2013-06-11 DIAGNOSIS — E785 Hyperlipidemia, unspecified: Secondary | ICD-10-CM

## 2013-06-11 LAB — COMPREHENSIVE METABOLIC PANEL
ALT: 46 U/L (ref 0–53)
AST: 46 U/L — ABNORMAL HIGH (ref 0–37)
Albumin: 4 g/dL (ref 3.5–5.2)
Alkaline Phosphatase: 57 U/L (ref 39–117)
BUN: 23 mg/dL (ref 6–23)
CALCIUM: 9.3 mg/dL (ref 8.4–10.5)
CO2: 28 meq/L (ref 19–32)
Chloride: 105 mEq/L (ref 96–112)
Creatinine, Ser: 1.3 mg/dL (ref 0.4–1.5)
GFR: 59.17 mL/min — AB (ref >60.00)
GLUCOSE: 100 mg/dL — AB (ref 70–99)
Potassium: 4.3 mEq/L (ref 3.5–5.1)
SODIUM: 139 meq/L (ref 135–145)
TOTAL PROTEIN: 7 g/dL (ref 6.0–8.3)
Total Bilirubin: 1.5 mg/dL — ABNORMAL HIGH (ref 0.2–1.2)

## 2013-06-11 LAB — URIC ACID: Uric Acid, Serum: 6.1 mg/dL (ref 4.0–7.8)

## 2013-06-11 LAB — LIPID PANEL
Cholesterol: 93 mg/dL (ref 0–200)
HDL: 36.5 mg/dL — ABNORMAL LOW (ref >39.00)
LDL Cholesterol: 41 mg/dL (ref 0–99)
Total CHOL/HDL Ratio: 3
Triglycerides: 80 mg/dL (ref 0.0–149.0)
VLDL: 16 mg/dL (ref 0.0–40.0)

## 2013-06-15 ENCOUNTER — Ambulatory Visit (INDEPENDENT_AMBULATORY_CARE_PROVIDER_SITE_OTHER): Payer: Medicare HMO | Admitting: Family Medicine

## 2013-06-15 ENCOUNTER — Encounter: Payer: Self-pay | Admitting: Family Medicine

## 2013-06-15 VITALS — BP 102/60 | HR 57 | Temp 97.9°F | Ht 68.5 in | Wt 169.0 lb

## 2013-06-15 DIAGNOSIS — Z0000 Encounter for general adult medical examination without abnormal findings: Secondary | ICD-10-CM

## 2013-06-15 DIAGNOSIS — Z23 Encounter for immunization: Secondary | ICD-10-CM

## 2013-06-15 MED ORDER — LISINOPRIL 10 MG PO TABS: ORAL_TABLET | ORAL | Status: DC

## 2013-06-15 MED ORDER — POTASSIUM CHLORIDE ER 10 MEQ PO TBCR: EXTENDED_RELEASE_TABLET | ORAL | Status: DC

## 2013-06-15 MED ORDER — SIMVASTATIN 40 MG PO TABS: ORAL_TABLET | ORAL | Status: DC

## 2013-06-15 MED ORDER — FUROSEMIDE 20 MG PO TABS: 20.0000 mg | ORAL_TABLET | Freq: Every day | ORAL | Status: DC

## 2013-06-15 MED ORDER — ALLOPURINOL 100 MG PO TABS: ORAL_TABLET | ORAL | Status: DC

## 2013-06-15 NOTE — Progress Notes (Signed)
The patient is here for annual wellness exam and preventative care.  I have personally reviewed the Medicare Annual Wellness questionnaire and have noted 1. The patient's medical and social history 2. Their use of alcohol, tobacco or illicit drugs 3. Their current medications and supplements 4. The patient's functional ability including ADL's, fall risks, home safety risks and hearing or visual             impairment. 5. Diet and physical activities 6. Evidence for depression or mood disorders The patients weight, height, BMI and visual acuity have been recorded in the chart I have made referrals, counseling and provided education to the patient based review of the above and I have provided the pt with a written personalized care plan for preventive services.   High cholesterol: Well controlled on simvastain 40 mg daily.  At goal LDL <70 Lab Results  Component Value Date   CHOL 93 06/11/2013   HDL 36.50* 06/11/2013   LDLCALC 41 06/11/2013   TRIG 80.0 06/11/2013   CHOLHDL 3 06/11/2013    CAD, CHF: [facility].stable on current meds  history of ICM s/p ICD, CAD s/p CABG Followed by Dr. Ladona Ridgelaylor 10/2012  He has been mowing his grass without chest pain or SOB. No ICD events. No orthopnea, PND, lower ext edema.  Wt Readings from Last 3 Encounters:  06/15/13 169 lb (76.658 kg)  09/08/12 172 lb 12.8 oz (78.382 kg)  06/13/12 171 lb 8 oz (77.792 kg)   He has been using lasix daily , he has increased it if weight is above goal.  Prediabetes: mild Limiting sweets some.  Walking 2 miles every morning.  Work part-time at [employer].   Anxiety, well controlled..using Alprazolam 1 mg nightly for insomnia.  Main issue is that coreg "jacks him up".  No daytime anxiety, no depression.  No SI.   Gout: no flares in last year. Uric acid 6.7, on low uric acid diet and low dose allopurinol.  Review of Systems  Constitutional: Negative for fever, fatigue and unexpected weight change.   HENT: Negative for ear pain, congestion, sore throat rhinorrhea, trouble swallowing and postnasal drip.  Eyes: Negative for pain.  Respiratory: Negative for cough, stable shortness of breath and no wheezing.  Cardiovascular: Negative for chest pain, palpitations and leg swelling.  Gastrointestinal: Negative for nausea, abdominal pain, diarrhea, constipation and blood in stool.  Genitourinary: Negative for dysuria, urgency, hematuria, discharge, penile swelling, scrotal swelling, difficulty urinating, penile pain and testicular pain.  Skin: Negative for rash.  Neurological: Negative for syncope, weakness, light-headedness, numbness and headaches.  Psychiatric/Behavioral: Negative for behavioral problems and dysphoric mood. The patient is not nervous/anxious.  Objective:   Physical Exam  Constitutional: He appears well-developed and well-nourished. Non-toxic appearance. He does not appear ill. No distress.  HENT:  Head: Normocephalic and atraumatic.  Right Ear: Hearing, tympanic membrane, external ear and ear canal normal.  Left Ear: Hearing, tympanic membrane, external ear and ear canal normal.  Nose: Nose normal.  Mouth/Throat: Uvula is midline, oropharynx is clear and moist and mucous membranes are normal.  Eyes: Conjunctivae, EOM and lids are normal. Pupils are equal, round, and reactive to light. No foreign bodies found.  Neck: Trachea normal, normal range of motion and phonation normal. Neck supple. Carotid bruit is not present. No mass and no thyromegaly present.  Cardiovascular: Normal rate, regular rhythm, S1 normal, S2 normal, intact distal pulses and normal pulses. Exam reveals no gallop.  No murmur heard.  Pulmonary/Chest:  Breath sounds normal. He has no wheezes. He has no rhonchi. He has no rales.  Abdominal: Soft. Normal appearance and bowel sounds are normal. There is no hepatosplenomegaly. There is no tenderness. There is no rebound, no guarding and no CVA tenderness. No  hernia. Hernia confirmed negative in the right inguinal area and confirmed negative in the left inguinal area.  Genitourinary: Prostate normal, testes normal and penis normal. Rectal exam shows no external hemorrhoid, no internal hemorrhoid, no fissure, no mass, no tenderness and anal tone normal. Prostate is not enlarged and not tender. Right testis shows no mass and no tenderness. Left testis shows no mass and no tenderness. No paraphimosis or penile tenderness.  Lymphadenopathy:  He has no cervical adenopathy.  Right: No inguinal adenopathy present.  Left: No inguinal adenopathy present.  Neurological: He is alert. He has normal strength and normal reflexes. No cranial nerve deficit or sensory deficit. Gait normal.  Skin: Skin is warm, dry and intact. No rash noted.  Psychiatric: He has a normal mood and affect. His speech is normal and behavior is normal. Judgment normal.  Assessment & Plan:   Complete Physical Exam  The patient's preventative maintenance and recommended screening tests for an annual wellness exam were reviewed in full today.  Brought up to date unless services declined.  Counselled on the importance of diet, exercise, and its role in overall health and mortality.  The patient's FH and SH was reviewed, including their home life, tobacco status, and drug and alcohol status.   Vaccines: PNA, Td uptodate, Shingles: not interested, given prevnar  Colon: 07/20/2012 Dr. Kinnie Scales, repeat in 5 years. Prostate: not indicated, pt agreeable.

## 2013-06-15 NOTE — Addendum Note (Signed)
Addended by: Damita Lack on: 06/15/2013 02:44 PM   Modules accepted: Orders

## 2013-06-15 NOTE — Progress Notes (Signed)
Pre visit review using our clinic review tool, if applicable. No additional management support is needed unless otherwise documented below in the visit note. 

## 2013-06-20 ENCOUNTER — Encounter: Payer: Self-pay | Admitting: Internal Medicine

## 2013-06-20 ENCOUNTER — Ambulatory Visit (INDEPENDENT_AMBULATORY_CARE_PROVIDER_SITE_OTHER): Payer: Medicare HMO | Admitting: *Deleted

## 2013-06-20 ENCOUNTER — Telehealth: Payer: Self-pay | Admitting: Cardiology

## 2013-06-20 DIAGNOSIS — Z95810 Presence of automatic (implantable) cardiac defibrillator: Secondary | ICD-10-CM

## 2013-06-20 LAB — MDC_IDC_ENUM_SESS_TYPE_REMOTE
Date Time Interrogation Session: 20150527194700
HighPow Impedance: 38 Ohm
Lead Channel Sensing Intrinsic Amplitude: 10.6 mV
Lead Channel Setting Sensing Sensitivity: 0.3 mV
MDC IDC MSMT BATTERY VOLTAGE: 2.62 V
MDC IDC MSMT LEADCHNL RV IMPEDANCE VALUE: 448 Ohm
MDC IDC SET LEADCHNL RV PACING AMPLITUDE: 2.5 V
MDC IDC SET LEADCHNL RV PACING PULSEWIDTH: 0.4 ms
MDC IDC SET ZONE DETECTION INTERVAL: 240 ms
MDC IDC SET ZONE DETECTION INTERVAL: 340 ms
MDC IDC STAT BRADY RV PERCENT PACED: 2 %
Zone Setting Detection Interval: 300 ms

## 2013-06-20 NOTE — Telephone Encounter (Signed)
LMOVM reminding pt to send remote transmission.   

## 2013-06-20 NOTE — Progress Notes (Signed)
Remote ICD transmission.   

## 2013-06-25 ENCOUNTER — Other Ambulatory Visit: Payer: Self-pay

## 2013-06-25 NOTE — Telephone Encounter (Signed)
Pt request refill alprazolam to [facility].Please advise.

## 2013-06-26 MED ORDER — ALPRAZOLAM 1 MG PO TABS: 1.0000 mg | ORAL_TABLET | Freq: Every day | ORAL | Status: DC | PRN

## 2013-06-26 NOTE — Telephone Encounter (Signed)
Called to [facility] 

## 2013-06-26 NOTE — Telephone Encounter (Signed)
[name] Roberts notified refill has been called to his pharmacy.

## 2013-07-04 ENCOUNTER — Encounter: Payer: Self-pay | Admitting: Family Medicine

## 2013-07-10 ENCOUNTER — Encounter: Payer: Medicare HMO | Admitting: Cardiology

## 2013-07-18 ENCOUNTER — Encounter: Payer: Self-pay | Admitting: Internal Medicine

## 2013-07-18 ENCOUNTER — Ambulatory Visit (INDEPENDENT_AMBULATORY_CARE_PROVIDER_SITE_OTHER): Payer: Medicare HMO | Admitting: Internal Medicine

## 2013-07-18 ENCOUNTER — Encounter: Payer: Self-pay | Admitting: *Deleted

## 2013-07-18 VITALS — BP 98/61 | HR 52 | Ht 68.5 in | Wt 168.8 lb

## 2013-07-18 DIAGNOSIS — I5022 Chronic systolic (congestive) heart failure: Secondary | ICD-10-CM

## 2013-07-18 DIAGNOSIS — Z95810 Presence of automatic (implantable) cardiac defibrillator: Secondary | ICD-10-CM

## 2013-07-18 DIAGNOSIS — I2589 Other forms of chronic ischemic heart disease: Secondary | ICD-10-CM

## 2013-07-18 NOTE — Assessment & Plan Note (Signed)
His Medtronic single-chamber ICD is working normally, but is at elective replacement. He has a 6949-lead in place. The risk of fracture of this lead is high. I've recommended placing a new lead at the time of generator change out. The risk, goals, benefits, and expectations of the procedure have been discussed in detail. He wishes to proceed.

## 2013-07-18 NOTE — Assessment & Plan Note (Signed)
His symptoms remain class IIA. His ejection fraction has been 20% by echo for the last 7 years. Most recently, his echo demonstrated an ejection fraction of 20% approximately 9 months ago. He is on maximal medical therapy.

## 2013-07-18 NOTE — Patient Instructions (Addendum)
YOU HAVE BEEN SCHEDULED FOR A NEW BATTERY CHANGE WITH YOUR DEVICE; YOU ARE SCHEDULED FOR 08/02/13 @ 1 PM WITH DR. Ladona Ridgel; SEE INSTRUCTIONS LETTER  LAB WORK 07/30/13 FOR PROCEDURE ON 08/02/13

## 2013-07-18 NOTE — Addendum Note (Signed)
Addended by: Tarri Fuller on: 07/18/2013 10:51 AM   Modules accepted: Orders

## 2013-07-18 NOTE — Assessment & Plan Note (Signed)
He denies anginal symptoms. He will continue his current medical therapy. 

## 2013-07-18 NOTE — Progress Notes (Signed)
HPI [name] returns today for followup. He is a very pleasant 73 year old man with an ischemic cardiomyopathy, chronic systolic heart failure, status post ICD implantation. In the interim, he has done well. He denies chest pain or shortness of breath. He continues to work part time. He denies syncope or peripheral edema. No ICD shocks. He is occaisionaly having weight gain for which he will take additional lasix and the weight returns to normal. No Known Allergies   Current Outpatient Prescriptions  Medication Sig Dispense Refill  . allopurinol (ZYLOPRIM) 100 MG tablet TAKE 1 TABLET BY MOUTH ONCE DAILY  90 tablet  3  . ALPRAZolam (XANAX) 1 MG tablet Take 1 tablet (1 mg total) by mouth daily as needed.  30 tablet  0  . aspirin 81 MG tablet Take 81 mg by mouth daily.        . carvedilol (COREG) 6.25 MG tablet Take 1 tablet (6.25 mg total) by mouth 2 (two) times daily with a meal.  180 tablet  3  . furosemide (LASIX) 20 MG tablet Take 1 tablet (20 mg total) by mouth daily.  90 tablet  3  . lisinopril (PRINIVIL,ZESTRIL) 10 MG tablet TAKE 1 TABLET BY MOUTH ONCE A DAY  90 tablet  3  . niacin (NIASPAN) 500 MG CR tablet Take 500 mg by mouth at bedtime.        . potassium chloride (K-DUR) 10 MEQ tablet TAKE 1 TABLET BY MOUTH ONCE A DAY  90 tablet  3  . simvastatin (ZOCOR) 40 MG tablet TAKE 1 TABLET BY MOUTH ONCE A DAY  90 tablet  3   No current facility-administered medications for this visit.     Past Medical History  Diagnosis Date  . Chronic systolic heart failure   . Cardiomyopathy, ischemic   . CAD (coronary artery disease)   . HLD (hyperlipidemia)   . Herpes zoster   . Gout     ROS:   All systems reviewed and negative except as noted in the HPI.   Past Surgical History  Procedure Laterality Date  . Appendectomy    . Coronary artery bypass graft      ? valve repair 2007 Dr. Barry Dieneswens  . Cardiac defibrillator placement      Dr. Ladona Ridgelaylor     Family History  Problem Relation  Age of Onset  . Cancer Mother     brain cancer  . Heart disease Father     CAD AND CHF     History   Social History  . Marital Status: Married    Spouse Name: N/A    Number of Children: 2  . Years of Education: N/A   Occupational History  . Not on file.   Social History Main Topics  . Smoking status: Former Games developermoker  . Smokeless tobacco: Never Used  . Alcohol Use: No  . Drug Use: No  . Sexual Activity: Not on file   Other Topics Concern  . Not on file   Social History Narrative   Exercise: 2 miles walking daily   Diet: Moderate...some fruit and veggies, but eats some meat/potatos.   Limited fast food.   End of Life: HCPOA wife, no living will   Full Code ( reviewed 2015)                 BP 98/61  Pulse 52  Ht 5' 8.5" (1.74 m)  Wt 168 lb 12.8 oz (76.567 kg)  BMI 25.29 kg/m2  Physical Exam:  Well appearing 73 year old man, NAD HEENT: Unremarkable Neck:  6 cm JVD, no thyromegally Lungs:  Clear with no wheezes, rales, or rhonchi. Well-healed ICD incision. HEART:  Regular rate rhythm, no murmurs, no rubs, no clicks Abd:  soft, positive bowel sounds, no organomegally, no rebound, no guarding Ext:  2 plus pulses, no edema, no cyanosis, no clubbing Skin:  No rashes no nodules Neuro:  CN II through XII intact, motor grossly intact  DEVICE  Normal device function.  See PaceArt for details.   Assess/Plan:

## 2013-07-19 MED ORDER — CEFAZOLIN SODIUM-DEXTROSE 2-3 GM-% IV SOLR: 2.0000 g | INTRAVENOUS | Status: DC

## 2013-07-19 MED ORDER — SODIUM CHLORIDE 0.9 % IR SOLN: 80.0000 mg | Status: DC

## 2013-07-19 MED ORDER — CHLORHEXIDINE GLUCONATE 4 % EX LIQD: 60.0000 mL | Freq: Once | CUTANEOUS | Status: DC

## 2013-07-19 MED ORDER — SODIUM CHLORIDE 0.9 % IV SOLN: INTRAVENOUS | Status: DC

## 2013-07-24 ENCOUNTER — Other Ambulatory Visit: Payer: Self-pay | Admitting: *Deleted

## 2013-07-24 ENCOUNTER — Encounter (HOSPITAL_COMMUNITY): Payer: Self-pay | Admitting: Pharmacy Technician

## 2013-07-24 MED ORDER — ALPRAZOLAM 1 MG PO TABS
1.0000 mg | ORAL_TABLET | Freq: Every day | ORAL | Status: DC
Start: 2013-07-24 — End: 2013-08-24

## 2013-07-24 NOTE — Telephone Encounter (Signed)
Called to [facility] 

## 2013-07-24 NOTE — Telephone Encounter (Signed)
Last office visit 06/15/2013.  Ok to refill?

## 2013-07-30 ENCOUNTER — Other Ambulatory Visit (INDEPENDENT_AMBULATORY_CARE_PROVIDER_SITE_OTHER): Payer: Medicare HMO

## 2013-07-30 DIAGNOSIS — Z95810 Presence of automatic (implantable) cardiac defibrillator: Secondary | ICD-10-CM

## 2013-07-30 DIAGNOSIS — I2589 Other forms of chronic ischemic heart disease: Secondary | ICD-10-CM

## 2013-07-30 DIAGNOSIS — I5022 Chronic systolic (congestive) heart failure: Secondary | ICD-10-CM

## 2013-07-30 LAB — CBC WITH DIFFERENTIAL/PLATELET
BASOS ABS: 0 10*3/uL (ref 0.0–0.1)
Basophils Relative: 0.7 % (ref 0.0–3.0)
EOS ABS: 0.2 10*3/uL (ref 0.0–0.7)
Eosinophils Relative: 2.6 % (ref 0.0–5.0)
HCT: 40.1 % (ref 39.0–52.0)
Hemoglobin: 13.3 g/dL (ref 13.0–17.0)
LYMPHS PCT: 30.5 % (ref 12.0–46.0)
Lymphs Abs: 1.9 10*3/uL (ref 0.7–4.0)
MCHC: 33.2 g/dL (ref 30.0–36.0)
MCV: 93 fl (ref 78.0–100.0)
MONOS PCT: 9.2 % (ref 3.0–12.0)
Monocytes Absolute: 0.6 10*3/uL (ref 0.1–1.0)
NEUTROS PCT: 57 % (ref 43.0–77.0)
Neutro Abs: 3.5 10*3/uL (ref 1.4–7.7)
PLATELETS: 159 10*3/uL (ref 150.0–400.0)
RBC: 4.31 Mil/uL (ref 4.22–5.81)
RDW: 14.9 % (ref 11.5–15.5)
WBC: 6.2 10*3/uL (ref 4.0–10.5)

## 2013-07-30 LAB — BASIC METABOLIC PANEL
BUN: 26 mg/dL — AB (ref 6–23)
CALCIUM: 9.4 mg/dL (ref 8.4–10.5)
CO2: 22 meq/L (ref 19–32)
CREATININE: 1.3 mg/dL (ref 0.4–1.5)
Chloride: 105 mEq/L (ref 96–112)
GFR: 56.08 mL/min — AB (ref >60.00)
GLUCOSE: 121 mg/dL — AB (ref 70–99)
Potassium: 4.3 mEq/L (ref 3.5–5.1)
Sodium: 137 mEq/L (ref 135–145)

## 2013-08-01 DIAGNOSIS — I509 Heart failure, unspecified: Secondary | ICD-10-CM | POA: Diagnosis not present

## 2013-08-01 DIAGNOSIS — Z4502 Encounter for adjustment and management of automatic implantable cardiac defibrillator: Secondary | ICD-10-CM | POA: Diagnosis not present

## 2013-08-01 DIAGNOSIS — I2510 Atherosclerotic heart disease of native coronary artery without angina pectoris: Secondary | ICD-10-CM | POA: Diagnosis not present

## 2013-08-01 DIAGNOSIS — I2589 Other forms of chronic ischemic heart disease: Secondary | ICD-10-CM | POA: Diagnosis not present

## 2013-08-01 DIAGNOSIS — I5022 Chronic systolic (congestive) heart failure: Secondary | ICD-10-CM | POA: Diagnosis not present

## 2013-08-01 DIAGNOSIS — E785 Hyperlipidemia, unspecified: Secondary | ICD-10-CM | POA: Diagnosis not present

## 2013-08-01 DIAGNOSIS — Z951 Presence of aortocoronary bypass graft: Secondary | ICD-10-CM | POA: Diagnosis not present

## 2013-08-01 MED ORDER — CHLORHEXIDINE GLUCONATE 4 % EX LIQD
60.0000 mL | Freq: Once | CUTANEOUS | Status: DC
  Filled 2013-08-01: qty 60

## 2013-08-01 MED ORDER — SODIUM CHLORIDE 0.9 % IV SOLN
INTRAVENOUS | Status: DC
  Administered 2013-08-02: 12:00:00 via INTRAVENOUS

## 2013-08-01 MED ORDER — SODIUM CHLORIDE 0.9 % IR SOLN
80.0000 mg | Status: DC
  Filled 2013-08-01: qty 2

## 2013-08-01 MED ORDER — CEFAZOLIN SODIUM-DEXTROSE 2-3 GM-% IV SOLR: 2.0000 g | INTRAVENOUS | Status: DC

## 2013-08-02 ENCOUNTER — Encounter (HOSPITAL_COMMUNITY): Payer: Self-pay | Admitting: General Practice

## 2013-08-02 ENCOUNTER — Encounter (HOSPITAL_COMMUNITY)
Admission: RE | Disposition: A | Discharge status: Home / Self Care | Payer: Self-pay | Source: Ambulatory Visit | Attending: Internal Medicine

## 2013-08-02 ENCOUNTER — Ambulatory Visit (HOSPITAL_COMMUNITY)
Admission: RE | Admit: 2013-08-02 | Discharge: 2013-08-03 | Disposition: A | Discharge status: Home / Self Care | Payer: Medicare HMO | Source: Ambulatory Visit | Attending: Internal Medicine | Admitting: Internal Medicine

## 2013-08-02 DIAGNOSIS — I2510 Atherosclerotic heart disease of native coronary artery without angina pectoris: Secondary | ICD-10-CM | POA: Insufficient documentation

## 2013-08-02 DIAGNOSIS — I5023 Acute on chronic systolic (congestive) heart failure: Secondary | ICD-10-CM | POA: Diagnosis present

## 2013-08-02 DIAGNOSIS — I5022 Chronic systolic (congestive) heart failure: Secondary | ICD-10-CM | POA: Diagnosis not present

## 2013-08-02 DIAGNOSIS — I509 Heart failure, unspecified: Secondary | ICD-10-CM | POA: Diagnosis not present

## 2013-08-02 DIAGNOSIS — E785 Hyperlipidemia, unspecified: Secondary | ICD-10-CM | POA: Insufficient documentation

## 2013-08-02 DIAGNOSIS — Z4502 Encounter for adjustment and management of automatic implantable cardiac defibrillator: Secondary | ICD-10-CM | POA: Diagnosis not present

## 2013-08-02 DIAGNOSIS — I2589 Other forms of chronic ischemic heart disease: Secondary | ICD-10-CM | POA: Diagnosis not present

## 2013-08-02 DIAGNOSIS — Z95810 Presence of automatic (implantable) cardiac defibrillator: Secondary | ICD-10-CM

## 2013-08-02 DIAGNOSIS — Z951 Presence of aortocoronary bypass graft: Secondary | ICD-10-CM | POA: Insufficient documentation

## 2013-08-02 HISTORY — DX: Acute myocardial infarction, unspecified: I21.9

## 2013-08-02 HISTORY — DX: Unspecified osteoarthritis, unspecified site: M19.90

## 2013-08-02 HISTORY — DX: Pneumonia, unspecified organism: J18.9

## 2013-08-02 HISTORY — PX: ICD GENERATOR CHANGE: SHX5854

## 2013-08-02 HISTORY — DX: Personal history of other medical treatment: Z92.89

## 2013-08-02 HISTORY — DX: Heart failure, unspecified: I50.9

## 2013-08-02 HISTORY — DX: Iron deficiency anemia, unspecified: D50.9

## 2013-08-02 HISTORY — DX: Cardiac murmur, unspecified: R01.1

## 2013-08-02 HISTORY — PX: IMPLANTABLE CARDIOVERTER DEFIBRILLATOR (ICD) GENERATOR CHANGE: SHX5469

## 2013-08-02 LAB — SURGICAL PCR SCREEN
MRSA, PCR: NEGATIVE
STAPHYLOCOCCUS AUREUS: NEGATIVE

## 2013-08-02 SURGERY — ICD GENERATOR CHANGE
Anesthesia: LOCAL

## 2013-08-02 MED ORDER — MIDAZOLAM HCL 5 MG/5ML IJ SOLN
INTRAMUSCULAR | Status: AC
Start: 2013-08-02 — End: 2013-08-02
  Filled 2013-08-02: qty 5

## 2013-08-02 MED ORDER — SIMVASTATIN 40 MG PO TABS
40.0000 mg | ORAL_TABLET | Freq: Every day | ORAL | Status: DC
  Administered 2013-08-02: 40 mg via ORAL
  Filled 2013-08-02 (×2): qty 1

## 2013-08-02 MED ORDER — FUROSEMIDE 20 MG PO TABS
20.0000 mg | ORAL_TABLET | Freq: Every day | ORAL | Status: DC
  Administered 2013-08-03: 20 mg via ORAL
  Filled 2013-08-02: qty 1

## 2013-08-02 MED ORDER — CEFAZOLIN SODIUM-DEXTROSE 2-3 GM-% IV SOLR
INTRAVENOUS | Status: AC
  Filled 2013-08-02: qty 50

## 2013-08-02 MED ORDER — ALPRAZOLAM 0.5 MG PO TABS
1.0000 mg | ORAL_TABLET | Freq: Every day | ORAL | Status: DC
  Administered 2013-08-02: 1 mg via ORAL
  Filled 2013-08-02: qty 2

## 2013-08-02 MED ORDER — LIDOCAINE HCL (PF) 1 % IJ SOLN
INTRAMUSCULAR | Status: AC
  Filled 2013-08-02: qty 60

## 2013-08-02 MED ORDER — FENTANYL CITRATE 0.05 MG/ML IJ SOLN
INTRAMUSCULAR | Status: AC
  Filled 2013-08-02: qty 2

## 2013-08-02 MED ORDER — CARVEDILOL 6.25 MG PO TABS
6.2500 mg | ORAL_TABLET | Freq: Two times a day (BID) | ORAL | Status: DC
  Administered 2013-08-02 – 2013-08-03 (×2): 6.25 mg via ORAL
  Filled 2013-08-02 (×4): qty 1

## 2013-08-02 MED ORDER — ASPIRIN EC 81 MG PO TBEC
81.0000 mg | DELAYED_RELEASE_TABLET | Freq: Every day | ORAL | Status: DC
  Administered 2013-08-02: 81 mg via ORAL
  Filled 2013-08-02 (×2): qty 1

## 2013-08-02 MED ORDER — FUROSEMIDE 20 MG PO TABS
20.0000 mg | ORAL_TABLET | Freq: Every day | ORAL | Status: DC | PRN
  Filled 2013-08-02: qty 1

## 2013-08-02 MED ORDER — HEPARIN (PORCINE) IN NACL 2-0.9 UNIT/ML-% IJ SOLN
INTRAMUSCULAR | Status: AC
  Filled 2013-08-02: qty 500

## 2013-08-02 MED ORDER — ALLOPURINOL 100 MG PO TABS
100.0000 mg | ORAL_TABLET | Freq: Every day | ORAL | Status: DC
  Administered 2013-08-02: 100 mg via ORAL
  Filled 2013-08-02 (×2): qty 1

## 2013-08-02 MED ORDER — POTASSIUM CHLORIDE CRYS ER 20 MEQ PO TBCR: 10.0000 meq | EXTENDED_RELEASE_TABLET | Freq: Every day | ORAL | Status: DC | PRN

## 2013-08-02 MED ORDER — NIACIN ER (ANTIHYPERLIPIDEMIC) 500 MG PO TBCR
500.0000 mg | EXTENDED_RELEASE_TABLET | Freq: Every day | ORAL | Status: DC
  Administered 2013-08-02: 500 mg via ORAL
  Filled 2013-08-02 (×2): qty 1

## 2013-08-02 MED ORDER — ACETAMINOPHEN 325 MG PO TABS: 325.0000 mg | ORAL_TABLET | ORAL | Status: DC | PRN

## 2013-08-02 MED ORDER — ONDANSETRON HCL 4 MG/2ML IJ SOLN: 4.0000 mg | Freq: Four times a day (QID) | INTRAMUSCULAR | Status: DC | PRN

## 2013-08-02 MED ORDER — FUROSEMIDE 20 MG PO TABS: 20.0000 mg | ORAL_TABLET | ORAL | Status: DC

## 2013-08-02 MED ORDER — MUPIROCIN 2 % EX OINT
TOPICAL_OINTMENT | CUTANEOUS | Status: AC
  Administered 2013-08-02: 1 via NASAL
  Filled 2013-08-02: qty 22

## 2013-08-02 MED ORDER — CEFAZOLIN SODIUM-DEXTROSE 2-3 GM-% IV SOLR
2.0000 g | Freq: Four times a day (QID) | INTRAVENOUS | Status: AC
  Administered 2013-08-02 – 2013-08-03 (×3): 2 g via INTRAVENOUS
  Filled 2013-08-02 (×3): qty 50

## 2013-08-02 MED ORDER — LISINOPRIL 10 MG PO TABS
10.0000 mg | ORAL_TABLET | Freq: Every morning | ORAL | Status: DC
  Administered 2013-08-03: 10 mg via ORAL
  Filled 2013-08-02: qty 1

## 2013-08-02 MED ORDER — POTASSIUM CHLORIDE ER 10 MEQ PO TBCR
10.0000 meq | EXTENDED_RELEASE_TABLET | Freq: Every day | ORAL | Status: DC
  Administered 2013-08-03: 10 meq via ORAL
  Filled 2013-08-02: qty 1

## 2013-08-02 MED ORDER — POTASSIUM CHLORIDE ER 10 MEQ PO TBCR: 10.0000 meq | EXTENDED_RELEASE_TABLET | ORAL | Status: DC

## 2013-08-02 MED ORDER — MIDAZOLAM HCL 5 MG/5ML IJ SOLN
INTRAMUSCULAR | Status: AC
  Filled 2013-08-02: qty 5

## 2013-08-02 MED ORDER — MUPIROCIN 2 % EX OINT
TOPICAL_OINTMENT | Freq: Two times a day (BID) | CUTANEOUS | Status: DC
  Administered 2013-08-02: 1 via NASAL

## 2013-08-02 NOTE — Progress Notes (Signed)
Patient is resting watching TV. Patient complains of no pain or discomfort from surgical site when asked.  Dressing over surgical site is clean, dry, and intact with no drainage present. VSS. Will continue to monitor patient to end of shift.

## 2013-08-02 NOTE — Progress Notes (Signed)
Jennifer O'Neil,R.N. Notified of EKG results / Amber,PA to come assess EKG

## 2013-08-02 NOTE — Progress Notes (Signed)
Arm sling applied to right arm. Instructions explained to patient. Patient awake, talking and joking.

## 2013-08-03 ENCOUNTER — Ambulatory Visit (HOSPITAL_COMMUNITY): Payer: Medicare HMO

## 2013-08-03 DIAGNOSIS — I509 Heart failure, unspecified: Secondary | ICD-10-CM | POA: Diagnosis not present

## 2013-08-03 DIAGNOSIS — I2589 Other forms of chronic ischemic heart disease: Secondary | ICD-10-CM

## 2013-08-03 DIAGNOSIS — Z95810 Presence of automatic (implantable) cardiac defibrillator: Secondary | ICD-10-CM

## 2013-08-03 DIAGNOSIS — I5022 Chronic systolic (congestive) heart failure: Secondary | ICD-10-CM | POA: Diagnosis not present

## 2013-08-03 DIAGNOSIS — Z4502 Encounter for adjustment and management of automatic implantable cardiac defibrillator: Secondary | ICD-10-CM | POA: Diagnosis not present

## 2013-08-03 NOTE — Discharge Instructions (Addendum)
Cardiac Diet This diet can help prevent heart disease and stroke. Many factors influence your heart health, including eating and exercise habits. Coronary risk rises a lot with abnormal blood fat (lipid) levels. Cardiac meal planning includes limiting unhealthy fats, increasing healthy fats, and making other small dietary changes. General guidelines are as follows:  Adjust calorie intake to reach and maintain desirable body weight.  Limit total fat intake to less than 30% of total calories. Saturated fat should be less than 7% of calories.  Saturated fats are found in animal products and in some vegetable products. Saturated vegetable fats are found in coconut oil, cocoa butter, palm oil, and palm kernel oil. Read labels carefully to avoid these products as much as possible. Use butter in moderation. Choose tub margarines and oils that have 2 grams of fat or less. Good cooking oils are canola and olive oils.  Practice low-fat cooking techniques. Do not fry food. Instead, broil, bake, boil, steam, grill, roast on a rack, stir-fry, or microwave it. Other fat reducing suggestions include:  Remove the skin from poultry.  Remove all visible fat from meats.  Skim the fat off stews, soups, and gravies before serving them.  Steam vegetables in water or broth instead of sauting them in fat.  Avoid foods with trans fat (or hydrogenated oils), such as commercially fried foods and commercially baked goods. Commercial shortening and deep-frying fats will contain trans fat.  Increase intake of fruits, vegetables, whole grains, and legumes to replace foods high in fat.  Increase consumption of nuts, legumes, and seeds to at least 4 servings weekly. One serving of a legume equals  cup, and 1 serving of nuts or seeds equals  cup.  Choose whole grains more often. Have 3 servings per day (a serving is 1 ounce [oz]).  Eat 4 to 5 servings of vegetables per day. A serving of vegetables is 1 cup of raw leafy  vegetables;  cup of raw or cooked cut-up vegetables;  cup of vegetable juice.  Eat 4 to 5 servings of fruit per day. A serving of fruit is 1 medium whole fruit;  cup of dried fruit;  cup of fresh, frozen, or canned fruit;  cup of 100% fruit juice.  Increase your intake of dietary fiber to 20 to 30 grams per day. Insoluble fiber may help lower your risk of heart disease and may help curb your appetite.  Soluble fiber binds cholesterol to be removed from the blood. Foods high in soluble fiber are dried beans, citrus fruits, oats, apples, bananas, broccoli, Brussels sprouts, and eggplant.  Try to include foods fortified with plant sterols or stanols, such as yogurt, breads, juices, or margarines. Choose several fortified foods to achieve a daily intake of 2 to 3 grams of plant sterols or stanols.  Foods with omega-3 fats can help reduce your risk of heart disease. Aim to have a 3.5 oz portion of fatty fish twice per week, such as salmon, mackerel, albacore tuna, sardines, lake trout, or herring. If you wish to take a fish oil supplement, choose one that contains 1 gram of both DHA and EPA.  Limit processed meats to 2 servings (3 oz portion) weekly.  Limit the sodium in your diet to 1500 milligrams (mg) per day. If you have high blood pressure, talk to a registered dietitian about a DASH (Dietary Approaches to Stop Hypertension) eating plan.  Limit sweets and beverages with added sugar, such as soda, to no more than 5 servings per week. One  serving is:   1 tablespoon sugar.  1 tablespoon jelly or jam.   cup sorbet.  1 cup lemonade.   cup regular soda. CHOOSING FOODS Starches  Allowed: Breads: All kinds (wheat, rye, raisin, white, oatmeal, [place], [place], and English muffin bread). Low-fat rolls: English muffins, frankfurter and hamburger buns, bagels, pita bread, tortillas (not fried). Pancakes, waffles, biscuits, and muffins made with recommended oil.  Avoid: Products made with  saturated or trans fats, oils, or whole milk products. Butter rolls, cheese breads, croissants. Commercial doughnuts, muffins, sweet rolls, biscuits, waffles, pancakes, store-bought mixes. Crackers  Allowed: Low-fat crackers and snacks: Animal, graham, rye, saltine (with recommended oil, no lard), oyster, and matzo crackers. Bread sticks, melba toast, rusks, flatbread, pretzels, and light popcorn.  Avoid: High-fat crackers: cheese crackers, butter crackers, and those made with coconut, palm oil, or trans fat (hydrogenated oils). Buttered popcorn. Cereals  Allowed: Hot or cold whole-grain cereals.  Avoid: Cereals containing coconut, hydrogenated vegetable fat, or animal fat. Potatoes / Pasta / Rice  Allowed: All kinds of potatoes, rice, and pasta (such as macaroni, spaghetti, and noodles).  Avoid: Pasta or rice prepared with cream sauce or high-fat cheese. Chow mein noodles, [place] fries. Vegetables  Allowed: All vegetables and vegetable juices.  Avoid: Fried vegetables. Vegetables in cream, butter, or high-fat cheese sauces. Limit coconut. Fruit in cream or custard. Protein  Allowed: Limit your intake of meat, seafood, and poultry to no more than 6 oz (cooked weight) per day. All lean, well-trimmed beef, veal, pork, and lamb. All chicken and [place] without skin. All fish and shellfish. Wild game: wild duck, rabbit, pheasant, and venison. Egg whites or low-cholesterol egg substitutes may be used as desired. Meatless dishes: recipes with dried beans, peas, lentils, and tofu (soybean curd). Seeds and nuts: all seeds and most nuts.  Avoid: Prime grade and other heavily marbled and fatty meats, such as short ribs, spare ribs, rib eye roast or steak, frankfurters, sausage, bacon, and high-fat luncheon meats, mutton. Caviar. Commercially fried fish. Domestic duck, goose, venison sausage. Organ meats: liver, gizzard, heart, chitterlings, brains, kidney, sweetbreads. Dairy  Allowed: Low-fat  cheeses: nonfat or low-fat cottage cheese (1% or 2% fat), cheeses made with part skim milk, such as mozzarella, farmers, string, or ricotta. (Cheeses should be labeled no more than 2 to 6 grams fat per oz.). Skim (or 1%) milk: liquid, powdered, or evaporated. Buttermilk made with low-fat milk. Drinks made with skim or low-fat milk or cocoa. Chocolate milk or cocoa made with skim or low-fat (1%) milk. Nonfat or low-fat yogurt.  Avoid: Whole milk cheeses, including colby, cheddar, mu[place], Monterey Jack, [place], [place], [place], American, Swiss, and blue. Creamed cottage cheese, cream cheese. Whole milk and whole milk products, including buttermilk or yogurt made from whole milk, drinks made from whole milk. Condensed milk, evaporated whole milk, and 2% milk. Soups and Combination Foods  Allowed: Low-fat low-sodium soups: broth, dehydrated soups, homemade broth, soups with the fat removed, homemade cream soups made with skim or low-fat milk. Low-fat spaghetti, lasagna, chili, and Spanish rice if low-fat ingredients and low-fat cooking techniques are used.  Avoid: Cream soups made with whole milk, cream, or high-fat cheese. All other soups. Desserts and Sweets  Allowed: Sherbet, fruit ices, gelatins, meringues, and angel food cake. Homemade desserts with recommended fats, oils, and milk products. Jam, jelly, honey, marmalade, sugars, and syrups. Pure sugar candy, such as gum drops, hard candy, jelly beans, marshmallows, mints, and small amounts of dark chocolate.  Avoid: Commercially prepared  cakes, pies, cookies, frosting, pudding, or mixes for these products. Desserts containing whole milk products, chocolate, coconut, lard, palm oil, or palm kernel oil. Ice cream or ice cream drinks. Candy that contains chocolate, coconut, butter, hydrogenated fat, or unknown ingredients. Buttered syrups. Fats and Oils  Allowed: Vegetable oils: safflower, sunflower, corn, soybean, cottonseed, sesame, canola, olive,  or peanut. Non-hydrogenated margarines. Salad dressing or mayonnaise: homemade or commercial, made with a recommended oil. Low or nonfat salad dressing or mayonnaise.  Limit added fats and oils to 6 to 8 tsp per day (includes fats used in cooking, baking, salads, and spreads on bread). Remember to count the "hidden fats" in foods.  Avoid: Solid fats and shortenings: butter, lard, salt pork, bacon drippings. Gravy containing meat fat, shortening, or suet. Cocoa butter, coconut. Coconut oil, palm oil, palm kernel oil, or hydrogenated oils: these ingredients are often used in bakery products, nondairy creamers, whipped toppings, candy, and commercially fried foods. Read labels carefully. Salad dressings made of unknown oils, sour cream, or cheese, such as blue cheese and Roquefort. Cream, all kinds: half-and-half, light, heavy, or whipping. Sour cream or cream cheese (even if "light" or low-fat). Nondairy cream substitutes: coffee creamers and sour cream substitutes made with palm, palm kernel, hydrogenated oils, or coconut oil. Beverages  Allowed: Coffee (regular or decaffeinated), tea. Diet carbonated beverages, mineral water. Alcohol: Check with your caregiver. Moderation is recommended.  Avoid: Whole milk, regular sodas, and juice drinks with added sugar. Condiments  Allowed: All seasonings and condiments. Cocoa powder. "Cream" sauces made with recommended ingredients.  Avoid: Carob powder made with hydrogenated fats. SAMPLE MENU Breakfast   cup orange juice   cup oatmeal  1 slice toast  1 tsp margarine  1 cup skim milk Lunch  [place] sandwich with 2 oz [place], 2 slices bread  Lettuce and tomato slices  Fresh fruit  Carrot sticks  Coffee or tea Snack  Fresh fruit or low-fat crackers Dinner  3 oz lean ground beef  1 baked potato  1 tsp margarine   cup asparagus  Lettuce salad  1 tbs non-creamy dressing   cup peach slices  1 cup skim milk Document Released:  10/21/2007 Document Revised: 07/13/2011 Document Reviewed: 04/06/2011 ExitCare Patient Information 2015 [employer]. This information is not intended to replace advice given to you by your health care provider. Make sure you discuss any questions you have with your health care provider.      Supplemental Discharge Instructions for  Pacemaker/Defibrillator Patients  Activity No heavy lifting or vigorous activity with your left/right arm for 6 to 8 weeks.  Do not raise your left/right arm above your head for one week.  Gradually raise your affected arm as drawn below.           __7/12                                  7/13                       7/14                          7/15  NO DRIVING for     ; you may begin driving on     .  WOUND CARE   Keep the wound area clean and dry.  Do not get this area wet for one  week. No showers for one week; you may shower on     .   The tape/steri-strips on your wound will fall off; do not pull them off.  No bandage is needed on the site.  DO  NOT apply any creams, oils, or ointments to the wound area.   If you notice any drainage or discharge from the wound, any swelling or bruising at the site, or you develop a fever > 101? F after you are discharged home, call the office at once.  Special Instructions   You are still able to use cellular telephones; use the ear opposite the side where you have your pacemaker/defibrillator.  Avoid carrying your cellular phone near your device.   When traveling through airports, show security personnel your identification card to avoid being screened in the metal detectors.  Ask the security personnel to use the hand wand.   Avoid arc welding equipment, MRI testing (magnetic resonance imaging), TENS units (transcutaneous nerve stimulators).  Call the office for questions about other devices.   Avoid electrical appliances that are in poor condition or are not properly grounded.   Microwave ovens are safe to be near or  to operate.  Additional information for defibrillator patients should your device go off:   If your device goes off ONCE and you feel fine afterward, notify the device clinic nurses.   If your device goes off ONCE and you do not feel well afterward, call 911.   If your device goes off TWICE, call 911.   If your device goes off THREE times in one day, call 911.  DO NOT DRIVE YOURSELF OR A FAMILY MEMBER WITH A DEFIBRILLATOR TO THE HOSPITAL--CALL 911.

## 2013-08-03 NOTE — CV Procedure (Signed)
EP Procedure Note  Procedure: Insertion of a new ICD lead, removal of an old ICD and insertion of a new ICD with left upper extremity venography.  Indication: Ischemic CM, chronic systolic heart failure, EF - 20%, s/p ICD with the current device at [facility] and a previously implanted and recalled Medtronic 6949 ICD lead.  Findings: After informed consent was obtained, the patient was taken to the diagnostic EP lab in the fasting state. 10 cc of contrast was injected into the left upper extremity venous system demonstrating a patent left subclavian vein. After the usual preparation and draping, 30 cc of lidocaine was infiltrated into the left infraclavicular region and a 6 cm incision carried out over the old ICD incision. Electrocautery was utilized to dissect down to the fascia. The left subclavian vein was punctured and the Medtronic 6935M 62 cm active fixation ICD lead, serial #[id number] was advanced into the right ventricle under flouroscopic guidance. Mapping was carried out. The lead was actively fixed.  At the final site the R waves measured 9 mV. The pacing impedence was 494 ohms. The threshold was 0.5 volts at 0.4 ms. 10 volt pacing did not stimulate the diaphragm. The lead was secured to the facial plane with silk suture. The sewing sleeve was secured with silk suture. Electrocautery was utilized to enter the prior ICD pocket and the generator was removed with gentle traction. The old [phone] lead was capped and the fibrous adhesions were freed up. The new Medtronic Evera XT VR ICD, serial#[id number] was connected to the new ICD lead and placed back in the pocket. The pocket was irrigated with anti-biotic irrigation and the incision was closed with silk suture. Benzoin and steristrips were painted on the skin. At this point I scrubbed out to supervise ICD testing.   Under my direct supervision, the patient was more deeply sedated with IV fentanyl and Versed. VF was induced with a T wave shock and a 20 J  shock was delivered, terminating VF and restoring NSR.   Complications: none immediately  Conclusion: successful ICD generator removal, insertion of a new ICD lead and ICD in a patient with an ICM, chronic class 2 systolic heart failure, EF 20%, with a prior Medtronic model 6949 ICD lead using contrast venography   Leonia Reeves.D.

## 2013-08-03 NOTE — Discharge Summary (Signed)
ELECTROPHYSIOLOGY PROCEDURE DISCHARGE SUMMARY    Patient ID: [name] Roberts,  MRN: [id number], DOB/AGE: 1940-05-11 73 y.o.  Admit date: 08/02/2013 Discharge date: 08/03/2013  Primary Care Physician: Kerby NoraAmy Bedsole, MD Primary Cardiologist: Sanjuana KavaMcAlhaney Electrophysiologist: Ladona Ridgelaylor  Primary Discharge Diagnosis:  Ischemic cardiomyopathy status [facility] on manufacturer recall; s/p ICD generator change and RV lead revision this admission  Secondary Discharge Diagnosis:  1.  CAD s/p CABG 2007 2.  Hyperlipidemia 3.  Chronic systolic heart failure - class II  No Known Allergies   Procedures This Admission:  1.  Implantation of a MDT single chamber ICD on 08-02-2013 by Dr Ladona Ridgelaylor.  The patient received a MDT model number  ICD with model number 6935 right ventricular lead.  The patient's previously implanted 6949 lead was capped. See op note for full details  2.  CXR on 08-03-2013 demonstrated no pneumothorax status post device implantation.   Brief HPI: [name] Roberts is a 73 y.o. male with a past medical history as outlined above.  He underwent original ICD implantation in June of 2007.  His device has recently reached elective replacement indicator.  His RV 6949 lead is on manufacturer recall.  Risks, benefits, and alternatives to ICD generator change and RV lead revision were reviewed with the patient who wished to proceed.   Hospital Course:  The patient was admitted and underwent implantation of a MDT single chamber ICD with RV lead revision with details as outlined above.   He was monitored on telemetry overnight which demonstrated sinus bradycardia.  Left chest was without hematoma or ecchymosis.  The device was interrogated and found to be functioning normally.  CXR was obtained and demonstrated no pneumothorax status post device implantation.  Wound care, arm mobility, and restrictions were reviewed with the patient.  Dr Johney FrameAllred examined the  patient and considered them stable for discharge to home.   The patient's discharge medications include an ACE-I (Lisinopril) and beta blocker (Coreg).   Discharge Vitals: Blood pressure 89/57, pulse 52, temperature 97.6 F (36.4 C), temperature source Oral, resp. rate 18, height 5' 8.5" (1.74 m), weight 165 lb (74.844 kg), SpO2 95.00%.  Physical Exam: Filed Vitals:   08/02/13 1800 08/02/13 1851 08/02/13 2000 08/03/13 0003  BP: 103/64 97/56 97/59  89/57  Pulse: 59 48 54 52  Temp:   98.3 F (36.8 C) 97.6 F (36.4 C)  TempSrc:   Oral Oral  Resp:   18 18  Height:      Weight:      SpO2:   94% 95%    GEN- The patient is well appearing, alert and oriented x 3 today.   Head- normocephalic, atraumatic Eyes-  Sclera clear, conjunctiva pink Ears- hearing intact Oropharynx- clear Neck- supple,   Lungs- Clear to ausculation bilaterally, normal work of breathing Chest- ICD pocket is without hematoma Heart- Regular rate and rhythm, no murmurs, rubs or gallops, PMI not laterally displaced GI- soft, NT, ND, + BS Extremities- no clubbing, cyanosis, or edema Neuro- strength and sensation are intact  ICD interrogation- reviewed in detail today (see paper chart)   Labs:   Lab Results  Component Value Date   WBC 6.2 07/30/2013   HGB 13.3 07/30/2013   HCT 40.1 07/30/2013   MCV 93.0 07/30/2013   PLT 159.0 07/30/2013     Recent Labs Lab 07/30/13 1054  NA 137  K 4.3  CL 105  CO2 22  BUN 26*  CREATININE  1.3  CALCIUM 9.4  GLUCOSE 121*    Discharge Medications:    Medication List    ASK your doctor about these medications       allopurinol 100 MG tablet  Commonly known as:  ZYLOPRIM  Take 100 mg by mouth daily.     ALPRAZolam 1 MG tablet  Commonly known as:  XANAX  Take 1 tablet (1 mg total) by mouth at bedtime.     aspirin EC 81 MG tablet  Take 81 mg by mouth daily.     carvedilol 6.25 MG tablet  Commonly known as:  COREG  Take 6.25 mg by mouth 2 (two) times daily with a  meal.     furosemide 20 MG tablet  Commonly known as:  LASIX  Take 20-40 mg by mouth See admin instructions. *Takes 20mg  daily with potassium tablet, but will take another furosemide 20mg  along with another potassium if needed based on weight*     lisinopril 10 MG tablet  Commonly known as:  PRINIVIL,ZESTRIL  Take 10 mg by mouth every morning.     niacin 500 MG CR tablet  Commonly known as:  NIASPAN  Take 500 mg by mouth at bedtime.     potassium chloride 10 MEQ tablet  Commonly known as:  K-DUR  Take 10-20 mEq by mouth See admin instructions. *takes 10 MEQ daily with furosemide 20mg  tablet, but will take another furosemide 20mg  along with another potassium if needed based on weight*     REFRESH 1.4-0.6 % ophthalmic solution  Generic drug:  polyvinyl alcohol-povidone  Place 1 drop into the left eye as needed (for irritation).     simvastatin 40 MG tablet  Commonly known as:  ZOCOR  Take 40 mg by mouth daily.        Disposition:     Duration of Discharge Encounter: Greater than 30 minutes including physician time.  Signed,   Hillis Range MD

## 2013-08-04 NOTE — H&P (Signed)
  ICD Criteria  Current LVEF:20% ;Obtained > or = 1 month ago and < or = 3 months ago.  NYHA Functional Classification: Class II  Heart Failure History:  Yes, Duration of heart failure since onset is > 9 months  Non-Ischemic Dilated Cardiomyopathy History:  No.  Atrial Fibrillation/Atrial Flutter:  No.  Ventricular Tachycardia History:  No.  Cardiac Arrest History:  No  History of Syndromes with Risk of Sudden Death:  No.  Previous ICD:  Yes, ICD Type:  Single, Reason for ICD:  Primary prevention.  20%  Electrophysiology Study: No.  Prior MI: Yes, Most recent MI timeframe is > 40 days.  PPM: No.  OSA:  No  Patient Life Expectancy of >=1 year: Yes.  Anticoagulation Therapy:  Patient is NOT on anticoagulation therapy.   Beta Blocker Therapy:  Yes.   Ace Inhibitor/ARB Therapy:  Yes.

## 2013-08-16 ENCOUNTER — Ambulatory Visit (INDEPENDENT_AMBULATORY_CARE_PROVIDER_SITE_OTHER): Payer: Medicare HMO | Admitting: *Deleted

## 2013-08-16 DIAGNOSIS — I2589 Other forms of chronic ischemic heart disease: Secondary | ICD-10-CM

## 2013-08-16 DIAGNOSIS — I5022 Chronic systolic (congestive) heart failure: Secondary | ICD-10-CM

## 2013-08-16 LAB — MDC_IDC_ENUM_SESS_TYPE_INCLINIC
Date Time Interrogation Session: 20150723102927
HighPow Impedance: 190 Ohm
HighPow Impedance: 53 Ohm
Lead Channel Pacing Threshold Amplitude: 0.25 V
Lead Channel Pacing Threshold Pulse Width: 0.4 ms
Lead Channel Sensing Intrinsic Amplitude: 11.125 mV
Lead Channel Setting Pacing Pulse Width: 0.4 ms
MDC IDC MSMT BATTERY REMAINING LONGEVITY: 137 mo
MDC IDC MSMT BATTERY VOLTAGE: 3.12 V
MDC IDC MSMT LEADCHNL RV IMPEDANCE VALUE: 513 Ohm
MDC IDC MSMT LEADCHNL RV SENSING INTR AMPL: 12.25 mV
MDC IDC SET LEADCHNL RV PACING AMPLITUDE: 3.5 V
MDC IDC SET LEADCHNL RV SENSING SENSITIVITY: 0.3 mV
MDC IDC SET ZONE DETECTION INTERVAL: 340 ms
MDC IDC STAT BRADY RV PERCENT PACED: 3.49 %
Zone Setting Detection Interval: 300 ms
Zone Setting Detection Interval: 360 ms

## 2013-08-16 NOTE — Progress Notes (Signed)
Wound check appointment. Steri-strips removed. Wound without redness or edema. Incision edges approximated, wound well healed. Normal device function. Threshold, sensing, and impedances consistent with implant measurements. Device programmed at 3.5V for extra safety margin until 3 month visit. Histogram distribution appropriate for patient and level of activity. No ventricular arrhythmias noted. Patient educated about wound care, arm mobility, lifting restrictions, shock plan. ROV in 3 months with GT. 

## 2013-08-22 ENCOUNTER — Telehealth: Payer: Self-pay | Admitting: *Deleted

## 2013-08-22 NOTE — Telephone Encounter (Signed)
Pt received new home monitor that is landline capable due to poor cell reception. Pt was given 2490 unit.

## 2013-08-22 NOTE — Telephone Encounter (Signed)
[  cont] Pt has Time Sheliah Hatch phone svc. I mailed him a DSL filter.

## 2013-08-24 ENCOUNTER — Other Ambulatory Visit: Payer: Self-pay | Admitting: *Deleted

## 2013-08-24 MED ORDER — ALPRAZOLAM 1 MG PO TABS: 1.0000 mg | ORAL_TABLET | Freq: Every day | ORAL | Status: DC

## 2013-08-24 NOTE — Telephone Encounter (Signed)
Ok to refill, 30 , 0 ref

## 2013-08-24 NOTE — Telephone Encounter (Signed)
Called to [facility] 

## 2013-08-24 NOTE — Telephone Encounter (Signed)
Received faxed refill request from pharmacy. Last refill 07/24/13 #30, last office visit 06/15/13. Is it okay to refill medication?

## 2013-08-27 ENCOUNTER — Telehealth: Payer: Self-pay | Admitting: Internal Medicine

## 2013-08-27 NOTE — Telephone Encounter (Signed)
Noted in PaceArt. 

## 2013-08-27 NOTE — Telephone Encounter (Signed)
FYI   Pt calling notify office that his remote device is now hooked up.

## 2013-09-18 ENCOUNTER — Other Ambulatory Visit: Payer: Self-pay | Admitting: Internal Medicine

## 2013-09-20 ENCOUNTER — Ambulatory Visit (INDEPENDENT_AMBULATORY_CARE_PROVIDER_SITE_OTHER): Payer: Medicare HMO | Admitting: Cardiovascular Disease

## 2013-09-20 ENCOUNTER — Encounter: Payer: Self-pay | Admitting: Cardiovascular Disease

## 2013-09-20 ENCOUNTER — Encounter: Payer: Self-pay | Admitting: Internal Medicine

## 2013-09-20 VITALS — BP 100/62 | HR 80 | Ht 68.5 in | Wt 169.0 lb

## 2013-09-20 DIAGNOSIS — I2510 Atherosclerotic heart disease of native coronary artery without angina pectoris: Secondary | ICD-10-CM

## 2013-09-20 DIAGNOSIS — I255 Ischemic cardiomyopathy: Secondary | ICD-10-CM

## 2013-09-20 DIAGNOSIS — I2589 Other forms of chronic ischemic heart disease: Secondary | ICD-10-CM

## 2013-09-20 DIAGNOSIS — I5022 Chronic systolic (congestive) heart failure: Secondary | ICD-10-CM

## 2013-09-20 NOTE — Progress Notes (Signed)
History of Present Illness: 73 yo WM with history of ICM s/p ICD, CAD s/p CABG, hyperlipidemia, mitral valve disease here today for routine cardiac follow up. He underwent 3V CABG (LIMA to LAD, SVG to Diagonal, SVG to Circumflex) in 2007 along with mitral valve ring per Dr. Cornelius Moras. Echo 8/11 with EF of 20-25%. LV was dilated. His ICD is followed by Dr. Sharrell Ku. His ICD was updated July 2015.   He has been doing well. No complaints. No ICD events. No chest pain or SOB. No orthopnea, PND, lower extremity edema. His weight is stable. He has been working hard in the yard.   Primary Care Physician: Ermalene Searing  Last Lipid Profile:Lipid Panel     Component Value Date/Time   CHOL 93 06/11/2013 0845   TRIG 80.0 06/11/2013 0845   HDL 36.50* 06/11/2013 0845   CHOLHDL 3 06/11/2013 0845   VLDL 16.0 06/11/2013 0845   LDLCALC 41 06/11/2013 0845    Past Medical History  Diagnosis Date  . Chronic systolic heart failure   . Cardiomyopathy, ischemic   . CAD (coronary artery disease)   . HLD (hyperlipidemia)   . Herpes zoster   . Gout   . Heart murmur   . CHF (congestive heart failure)   . Myocardial infarction 1981  . Pneumonia 12/20206  . History of blood transfusion     "S/P OHS"  . Anemia   . Iron deficiency anemia     "S/P OHS"  . Arthritis     "right thumb; posterior neck" (08/02/2013)    Past Surgical History  Procedure Laterality Date  . Appendectomy    . Cardiac defibrillator placement  2007    Dr. Ladona Ridgel  . Icd generator change  08/02/2013    "w/new leads"  . Band hemorrhoidectomy    . Cardiac catheterization  191; 2007  . Aortic valve repair  2007    "put an O ring in it"  . Coronary artery bypass graft  2007    Dr. Barry Dienes  . Pilonidal cyst excision  1961    Current Outpatient Prescriptions  Medication Sig Dispense Refill  . allopurinol (ZYLOPRIM) 100 MG tablet Take 100 mg by mouth daily.      Marland Kitchen ALPRAZolam (XANAX) 1 MG tablet Take 1 tablet (1 mg total) by mouth at bedtime.   30 tablet  0  . aspirin EC 81 MG tablet Take 81 mg by mouth daily.      . carvedilol (COREG) 6.25 MG tablet TAKE ONE TABLET BY MOUTH TWICE DAILY WITH MEALS  180 tablet  0  . furosemide (LASIX) 20 MG tablet Take 20-40 mg by mouth See admin instructions. *Takes 20mg  daily with potassium tablet, but will take another furosemide 20mg  along with another potassium if needed based on weight*      . latanoprost (XALATAN) 0.005 % ophthalmic solution       . lisinopril (PRINIVIL,ZESTRIL) 10 MG tablet Take 10 mg by mouth every morning.      Marland Kitchen LOTEMAX 0.5 % GEL       . niacin (NIASPAN) 500 MG CR tablet Take 500 mg by mouth at bedtime.        . polyvinyl alcohol-povidone (REFRESH) 1.4-0.6 % ophthalmic solution Place 1 drop into the left eye as needed (for irritation).      . potassium chloride (K-DUR) 10 MEQ tablet Take 10-20 mEq by mouth See admin instructions. *takes 10 MEQ daily with furosemide 20mg  tablet, but will take another  furosemide  along with another potassium if needed based on weight*      . simvastatin (ZOCOR) 40 MG tablet Take 40 mg by mouth daily.       No current facility-administered medications for this visit.    No Known Allergies  History   Social History  . Marital Status: Married    Spouse Name: N/A    Number of Children: 2  . Years of Education: N/A   Occupational History  . Not on file.   Social History Main Topics  . Smoking status: Former Smoker -- 2.50 packs/day for 56 years    Types: Cigarettes    Quit date: 01/18/2005  . Smokeless tobacco: Never Used  . Alcohol Use: Yes     Comment: 08/02/2013 "beer:  most weeks none; some weeks 2-3 glasses; same w/wine"  . Drug Use: Yes     Comment: "a little speed when I was much younger"  . Sexual Activity: No   Other Topics Concern  . Not on file   Social History Narrative   Exercise: 2 miles walking daily   Diet: Moderate...some fruit and veggies, but eats some meat/potatos.   Limited fast food.    End of Life: HCPOA wife, no living will   Full Code ( reviewed 15)                Family History  Problem Relation Age of Onset  . Cancer Mother     brain cancer  . Heart disease Father     CAD AND CHF    Review of Systems:  As stated in the HPI and otherwise negative.   BP 100/62  Pulse 80  Ht 5' 8.5" (1.74 m)  Wt 169 lb (76.658 kg)  BMI 25.32 kg/m2  SpO2 99%  Physical Examination: General: Well developed, well nourished, NAD HEENT: OP clear, mucus membranes moist SKIN: warm, dry. No rashes. Neuro: No focal deficits Musculoskeletal: Muscle strength 5/5 all ext Psychiatric: Mood and affect normal Neck: No JVD, no carotid bruits, no thyromegaly, no lymphadenopathy. Lungs:Clear bilaterally, no wheezes, rhonci, crackles Cardiovascular: Regular rate and rhythm. No murmurs, gallops or rubs. Abdomen:Soft. Bowel sounds present. Non-tender.  Extremities: No lower extremity edema. Pulses are 2 + in the bilateral DP/PT.  Echo 09/01/12: Left ventricle: inferior/inferolateral/apical akinesis. Other walls severe hypokinesis. The cavity size was severely dilated. Wall thickness was normal. The estimated ejection fraction was 20%. Diffuse hypokinesis. Findings consistent with left ventricular diastolic dysfunction. Doppler parameters are consistent with high ventricular filling pressure. - Mitral valve: Probable mitral ring post CABG. Mild regurgitation. - Left atrium: The atrium was moderately dilated. - Right ventricle: The cavity size was moderately dilated. Pacer wire or catheter noted in right ventricle. Systolic function was moderately reduced. - Tricuspid valve: Moderate regurgitation. - Pulmonary arteries: PA peak pressure: 54mm Hg (S).   Assessment and Plan:   1. CAD: Stable. Continue current meds. No chest pain. BP and lipids are well controlled.       2. CHRONIC SYSTOLIC HEART FAILURE: Volume status is good. Continue current meds. He will follow his weight  every day and take extra lasix if needed.     3. ISCHEMIC CARDIOMYOPATHY: LVEF=20% by echo August 2014. No ICD events. He is on good medical therapy. NO changes. Follow up with Dr. Ladona Ridgel as planned.

## 2013-09-20 NOTE — Patient Instructions (Signed)
Your physician wants you to follow-up in:  12 months.  You will receive a reminder letter in the mail two months in advance. If you don't receive a letter, please call our office to schedule the follow-up appointment.   

## 2013-09-24 ENCOUNTER — Other Ambulatory Visit: Payer: Self-pay | Admitting: *Deleted

## 2013-09-24 NOTE — Telephone Encounter (Signed)
#  30x0 last filled 08/24/13. Last OV was a CPX on 06/15/13. Ok to fill?

## 2013-09-25 MED ORDER — ALPRAZOLAM 1 MG PO TABS: 1.0000 mg | ORAL_TABLET | Freq: Every day | ORAL | Status: DC

## 2013-09-26 NOTE — Telephone Encounter (Signed)
RX was not phoned in to pharmacy on 09/24/13, pt asking on status of RX.  Informed pt that it was approved and phoned in to pharmacy.

## 2013-10-04 ENCOUNTER — Encounter: Payer: Self-pay | Admitting: Internal Medicine

## 2013-10-24 ENCOUNTER — Other Ambulatory Visit: Payer: Self-pay

## 2013-10-24 NOTE — Telephone Encounter (Signed)
Pt request refill alprazolam to [facility]; pt has med for tonight but needs refill done by 10/25/13. Pt request cb when refilled.

## 2013-10-24 NOTE — Telephone Encounter (Signed)
Last office visit 06/15/2013.  Last refilled 09/25/2013 for #30 with no refills.  Ok to refill?

## 2013-10-26 MED ORDER — ALPRAZOLAM 1 MG PO TABS: 1.0000 mg | ORAL_TABLET | Freq: Every day | ORAL | Status: DC

## 2013-10-26 NOTE — Telephone Encounter (Signed)
Called to [facility] 

## 2013-10-30 ENCOUNTER — Ambulatory Visit (INDEPENDENT_AMBULATORY_CARE_PROVIDER_SITE_OTHER): Payer: Medicare HMO

## 2013-10-30 DIAGNOSIS — Z23 Encounter for immunization: Secondary | ICD-10-CM

## 2013-11-09 ENCOUNTER — Other Ambulatory Visit: Payer: Self-pay

## 2013-11-14 ENCOUNTER — Encounter: Payer: Self-pay | Admitting: Internal Medicine

## 2013-11-14 ENCOUNTER — Ambulatory Visit (INDEPENDENT_AMBULATORY_CARE_PROVIDER_SITE_OTHER): Payer: Medicare HMO | Admitting: Internal Medicine

## 2013-11-14 VITALS — BP 92/64 | HR 63 | Ht 68.5 in | Wt 167.4 lb

## 2013-11-14 DIAGNOSIS — I255 Ischemic cardiomyopathy: Secondary | ICD-10-CM

## 2013-11-14 DIAGNOSIS — Z4502 Encounter for adjustment and management of automatic implantable cardiac defibrillator: Secondary | ICD-10-CM

## 2013-11-14 DIAGNOSIS — I5022 Chronic systolic (congestive) heart failure: Secondary | ICD-10-CM

## 2013-11-14 DIAGNOSIS — Z95810 Presence of automatic (implantable) cardiac defibrillator: Secondary | ICD-10-CM

## 2013-11-14 LAB — MDC_IDC_ENUM_SESS_TYPE_INCLINIC
Battery Remaining Longevity: 136 mo
Battery Voltage: 3.09 V
Brady Statistic RV Percent Paced: 1.49 %
Date Time Interrogation Session: 20151021103701
HighPow Impedance: 190 Ohm
HighPow Impedance: 57 Ohm
Lead Channel Sensing Intrinsic Amplitude: 14.375 mV
Lead Channel Setting Pacing Pulse Width: 0.4 ms
Lead Channel Setting Sensing Sensitivity: 0.3 mV
MDC IDC MSMT LEADCHNL RV IMPEDANCE VALUE: 570 Ohm
MDC IDC MSMT LEADCHNL RV PACING THRESHOLD AMPLITUDE: 0.375 V
MDC IDC MSMT LEADCHNL RV PACING THRESHOLD PULSEWIDTH: 0.4 ms
MDC IDC MSMT LEADCHNL RV SENSING INTR AMPL: 12.375 mV
MDC IDC SET LEADCHNL RV PACING AMPLITUDE: 2.5 V
Zone Setting Detection Interval: 300 ms
Zone Setting Detection Interval: 340 ms
Zone Setting Detection Interval: 360 ms

## 2013-11-14 NOTE — Assessment & Plan Note (Signed)
His chronic systolic heart failure remains class IIb. He is encouraged to reduce his sodium intake, take his medications, and weigh himself regularly.

## 2013-11-14 NOTE — Patient Instructions (Addendum)
Remote monitoring is used to monitor your  ICD from home. This monitoring reduces the number of office visits required to check your device to one time per year. It allows [place] to keep an eye on the functioning of your device to ensure it is working properly. You are scheduled for a device check from home on 02-18-2014. You may send your transmission at any time that day. If you have a wireless device, the transmission will be sent automatically. After your physician reviews your transmission, you will receive a postcard with your next transmission date.  Your physician recommends that you schedule a follow-up appointment in: August 2016 with Dr.Taylor  Your physician recommends that you continue on your current medications as directed. Please refer to the Current Medication list given to you today.

## 2013-11-14 NOTE — Assessment & Plan Note (Signed)
He denies anginal symptoms. No change in medical therapy. 

## 2013-11-14 NOTE — Progress Notes (Signed)
HPI [name] returns today for followup. He is a very pleasant 73 year old man with an ischemic cardiomyopathy, chronic systolic heart failure, status post ICD implantation who underwent ICD generator change with a new ICD lead placed as the patient had an indwelling 6949 lead capped.  In the interim, he has done well. He is occaisionaly having weight gain and sob for which he will take additional lasix and the weight returns to normal and breathing improves. His heart failure is class 2B.   No Known Allergies   Current Outpatient Prescriptions  Medication Sig Dispense Refill  . allopurinol (ZYLOPRIM) 100 MG tablet Take 100 mg by mouth daily.      Marland Kitchen. ALPRAZolam (XANAX) 1 MG tablet Take 1 tablet (1 mg total) by mouth at bedtime.  30 tablet  0  . aspirin EC 81 MG tablet Take 81 mg by mouth daily.      . carvedilol (COREG) 6.25 MG tablet TAKE ONE TABLET BY MOUTH TWICE DAILY WITH MEALS  180 tablet  0  . furosemide (LASIX) 20 MG tablet Take 20-40 mg by mouth See admin instructions. *Takes 20mg  daily with potassium 10MEQ tablet, but will take another furosemide 20mg  along with another potassium 10MEQ if needed based on weight*      . latanoprost (XALATAN) 0.005 % ophthalmic solution       . lisinopril (PRINIVIL,ZESTRIL) 10 MG tablet Take 10 mg by mouth every morning.      Marland Kitchen. LOTEMAX 0.5 % GEL Apply 1 application topically 2 (two) times daily.       . niacin (NIASPAN) 500 MG CR tablet Take 500 mg by mouth at bedtime.        . polyvinyl alcohol-povidone (REFRESH) 1.4-0.6 % ophthalmic solution Place 1 drop into the left eye as needed (for irritation).      . potassium chloride (K-DUR) 10 MEQ tablet Take 10-20 mEq by mouth See admin instructions. *takes 10 MEQ daily with furosemide 20mg  tablet, but will take another furosemide 20mg  along with another potassium 10MEQ if needed based on weight*      . simvastatin (ZOCOR) 40 MG tablet Take 40 mg by mouth daily.       No current facility-administered  medications for this visit.     Past Medical History  Diagnosis Date  . Chronic systolic heart failure   . Cardiomyopathy, ischemic   . CAD (coronary artery disease)   . HLD (hyperlipidemia)   . Herpes zoster   . Gout   . Heart murmur   . CHF (congestive heart failure)   . Myocardial infarction 1981  . Pneumonia 12/20206  . History of blood transfusion     "S/P OHS"  . Anemia   . Iron deficiency anemia     "S/P OHS"  . Arthritis     "right thumb; posterior neck" (08/02/2013)    ROS:   All systems reviewed and negative except as noted in the HPI.   Past Surgical History  Procedure Laterality Date  . Appendectomy    . Cardiac defibrillator placement  2007    Dr. Ladona Ridgelaylor  . Icd generator change  08/02/2013    "w/new leads"  . Band hemorrhoidectomy    . Cardiac catheterization  191; 2007  . Aortic valve repair  2007    "put an O ring in it"  . Coronary artery bypass graft  2007    Dr. Barry Dieneswens  . Pilonidal cyst excision  1961     Family History  Problem Relation  Age of Onset  . Cancer Mother     brain cancer  . Heart disease Father     CAD AND CHF     History   Social History  . Marital Status: Married    Spouse Name: N/A    Number of Children: 2  . Years of Education: N/A   Occupational History  . Not on file.   Social History Main Topics  . Smoking status: Former Smoker -- 2.50 packs/day for 56 years    Types: Cigarettes    Quit date: 01/18/2005  . Smokeless tobacco: Never Used  . Alcohol Use: Yes     Comment: 08/02/2013 "beer:  most weeks none; some weeks 2-3 glasses; same w/wine"  . Drug Use: Yes     Comment: "a little speed when I was much younger"  . Sexual Activity: No   Other Topics Concern  . Not on file   Social History Narrative   Exercise: 2 miles walking daily   Diet: Moderate...some fruit and veggies, but eats some meat/potatos.   Limited fast food.   End of Life: HCPOA wife, no living will   Full Code ( reviewed 2015)                  BP 92/64  Pulse 63  Ht 5' 8.5" (1.74 m)  Wt 167 lb 6.4 oz (75.932 kg)  BMI 25.08 kg/m2  Physical Exam:  Well appearing 73 year old man, NAD HEENT: Unremarkable Neck:  6 cm JVD, no thyromegally Lungs:  Clear with no wheezes, rales, or rhonchi. Well-healed ICD incision. HEART:  Regular rate rhythm, no murmurs, no rubs, no clicks Abd:  soft, positive bowel sounds, no organomegally, no rebound, no guarding Ext:  2 plus pulses, no edema, no cyanosis, no clubbing Skin:  No rashes no nodules Neuro:  CN II through XII intact, motor grossly intact  DEVICE  Normal device function.  See PaceArt for details.   Assess/Plan:

## 2013-11-14 NOTE — Assessment & Plan Note (Signed)
His Medtronic ICD is working normally. He appears to have healed very nicely from his recent ICD generator change out and insertion of a new ICD lead.

## 2013-11-21 ENCOUNTER — Other Ambulatory Visit: Payer: Self-pay | Admitting: *Deleted

## 2013-11-21 MED ORDER — ALPRAZOLAM 1 MG PO TABS: 1.0000 mg | ORAL_TABLET | Freq: Every day | ORAL | Status: DC

## 2013-11-21 NOTE — Telephone Encounter (Signed)
Last office visit 06/15/2013.  Last refilled 10/26/2013 for #30 with no refills.  Ok to refill?

## 2013-11-22 NOTE — Telephone Encounter (Signed)
Called to [facility] 

## 2013-12-10 ENCOUNTER — Other Ambulatory Visit: Payer: Self-pay

## 2013-12-10 MED ORDER — FUROSEMIDE 20 MG PO TABS: 20.0000 mg | ORAL_TABLET | ORAL | Status: DC

## 2013-12-10 MED ORDER — POTASSIUM CHLORIDE ER 10 MEQ PO TBCR: 10.0000 meq | EXTENDED_RELEASE_TABLET | ORAL | Status: DC

## 2013-12-10 NOTE — Telephone Encounter (Signed)
Pt left v/m; pt had pacemaker or defibrillator in July and has had to take more of furosemide and K per order due to weight gain since pacemaker/defibrillator done in July per Dr Clifton James. Pt request refill with increased qty to #45 on each to [facility]; pt has not contacted cardiologist because Dr Ermalene Searing has always refilled for pt.Please advise.pt has med to last until end of week.

## 2013-12-11 MED ORDER — POTASSIUM CHLORIDE ER 10 MEQ PO TBCR: 10.0000 meq | EXTENDED_RELEASE_TABLET | ORAL | Status: DC

## 2013-12-11 MED ORDER — FUROSEMIDE 20 MG PO TABS: 20.0000 mg | ORAL_TABLET | ORAL | Status: DC

## 2013-12-11 NOTE — Addendum Note (Signed)
Addended by: Damita Lack on: 12/11/2013 09:13 AM   Modules accepted: Orders

## 2013-12-11 NOTE — Telephone Encounter (Signed)
Prescription printed so I can fax to pharmacy.  Faxed to [facility] [phone].

## 2013-12-24 ENCOUNTER — Other Ambulatory Visit: Payer: Self-pay | Admitting: *Deleted

## 2013-12-24 MED ORDER — ALPRAZOLAM 1 MG PO TABS: 1.0000 mg | ORAL_TABLET | Freq: Every day | ORAL | Status: DC

## 2013-12-24 NOTE — Telephone Encounter (Signed)
Received faxed refill request from pharmacy. Last refill 11/21/13 #30, last office visit 06/15/13. Is it okay to refill medication?

## 2013-12-24 NOTE — Telephone Encounter (Signed)
Called to [facility] 

## 2014-01-01 ENCOUNTER — Other Ambulatory Visit: Payer: Self-pay | Admitting: Internal Medicine

## 2014-01-03 ENCOUNTER — Encounter (HOSPITAL_COMMUNITY): Payer: Self-pay | Admitting: Internal Medicine

## 2014-01-23 ENCOUNTER — Other Ambulatory Visit: Payer: Self-pay | Admitting: *Deleted

## 2014-01-23 NOTE — Telephone Encounter (Signed)
Last filled 12/24/13 

## 2014-01-24 MED ORDER — ALPRAZOLAM 1 MG PO TABS: 1.0000 mg | ORAL_TABLET | Freq: Every day | ORAL | Status: DC

## 2014-01-24 NOTE — Telephone Encounter (Signed)
Pt left v/m requesting refill alprazolam be done today and pt request cb when refilled. [facility] faxed request on 01/23/14.Please advise.

## 2014-01-24 NOTE — Telephone Encounter (Signed)
I called rx in to [facility], and notified pt.

## 2014-02-18 ENCOUNTER — Telehealth: Payer: Self-pay | Admitting: Cardiology

## 2014-02-18 ENCOUNTER — Ambulatory Visit (INDEPENDENT_AMBULATORY_CARE_PROVIDER_SITE_OTHER): Payer: Medicare HMO | Admitting: *Deleted

## 2014-02-18 DIAGNOSIS — I255 Ischemic cardiomyopathy: Secondary | ICD-10-CM

## 2014-02-18 NOTE — Progress Notes (Signed)
Remote ICD transmission.   

## 2014-02-18 NOTE — Telephone Encounter (Signed)
Confirmed remote transmission w/ pt wife.   

## 2014-02-22 ENCOUNTER — Other Ambulatory Visit: Payer: Self-pay

## 2014-02-22 MED ORDER — ALPRAZOLAM 1 MG PO TABS: 1.0000 mg | ORAL_TABLET | Freq: Every day | ORAL | Status: DC

## 2014-02-22 NOTE — Telephone Encounter (Signed)
Called to [facility] 

## 2014-02-22 NOTE — Telephone Encounter (Signed)
Pt left v/m requesting refill alprazolam to [facility].Please advise. Pt last seen 06/15/13 and has CPX scheduled 06/18/14.

## 2014-02-24 LAB — MDC_IDC_ENUM_SESS_TYPE_REMOTE
Battery Remaining Longevity: 135 mo
Date Time Interrogation Session: 20160125203539
HIGH POWER IMPEDANCE MEASURED VALUE: 64 Ohm
Lead Channel Impedance Value: 437 Ohm
Lead Channel Pacing Threshold Amplitude: 0.5 V
Lead Channel Pacing Threshold Pulse Width: 0.4 ms
Lead Channel Sensing Intrinsic Amplitude: 14.625 mV
Lead Channel Setting Pacing Amplitude: 2.5 V
Lead Channel Setting Pacing Pulse Width: 0.4 ms
Lead Channel Setting Sensing Sensitivity: 0.3 mV
MDC IDC MSMT BATTERY VOLTAGE: 3.06 V
MDC IDC MSMT LEADCHNL RV IMPEDANCE VALUE: 513 Ohm
MDC IDC SET ZONE DETECTION INTERVAL: 300 ms
MDC IDC SET ZONE DETECTION INTERVAL: 340 ms
MDC IDC STAT BRADY RV PERCENT PACED: 1.38 %
Zone Setting Detection Interval: 360 ms

## 2014-02-28 ENCOUNTER — Encounter: Payer: Self-pay | Admitting: Cardiology

## 2014-03-05 ENCOUNTER — Encounter: Payer: Self-pay | Admitting: Internal Medicine

## 2014-03-14 ENCOUNTER — Encounter: Payer: Self-pay | Admitting: Cardiology

## 2014-03-25 ENCOUNTER — Other Ambulatory Visit: Payer: Self-pay | Admitting: Family Medicine

## 2014-03-25 NOTE — Telephone Encounter (Signed)
Called to [facility] 

## 2014-03-25 NOTE — Telephone Encounter (Signed)
Last office visit 06/15/2013.  Last refilled 02/22/2014 for #30 with no refills.  Ok to refill?

## 2014-04-03 ENCOUNTER — Other Ambulatory Visit: Payer: Self-pay | Admitting: Internal Medicine

## 2014-04-25 ENCOUNTER — Other Ambulatory Visit: Payer: Self-pay

## 2014-04-25 NOTE — Telephone Encounter (Signed)
Last office visit 06/16/2014.  Last refilled 03/25/2014 for #30 with no refills.  Ok to refill?

## 2014-04-25 NOTE — Telephone Encounter (Signed)
Pt left v/m checking on status of [facility] request for alprazolam refill. Pt request cb. Pt last seen 06/15/13.Please advise.

## 2014-04-26 MED ORDER — ALPRAZOLAM 1 MG PO TABS: 1.0000 mg | ORAL_TABLET | Freq: Every day | ORAL | Status: DC

## 2014-04-26 NOTE — Telephone Encounter (Signed)
Called to [facility].  [name] Roberts notified refill has been called to his pharmacy.

## 2014-05-21 ENCOUNTER — Ambulatory Visit (INDEPENDENT_AMBULATORY_CARE_PROVIDER_SITE_OTHER): Payer: Medicare HMO | Admitting: *Deleted

## 2014-05-21 DIAGNOSIS — I255 Ischemic cardiomyopathy: Secondary | ICD-10-CM | POA: Diagnosis not present

## 2014-05-21 NOTE — Progress Notes (Signed)
Remote ICD transmission.   

## 2014-05-22 LAB — MDC_IDC_ENUM_SESS_TYPE_REMOTE
Brady Statistic RV Percent Paced: 2.34 %
HIGH POWER IMPEDANCE MEASURED VALUE: 67 Ohm
Lead Channel Impedance Value: 437 Ohm
Lead Channel Impedance Value: 494 Ohm
Lead Channel Pacing Threshold Amplitude: 0.375 V
Lead Channel Setting Pacing Amplitude: 2.5 V
Lead Channel Setting Pacing Pulse Width: 0.4 ms
MDC IDC MSMT BATTERY REMAINING LONGEVITY: 134 mo
MDC IDC MSMT BATTERY VOLTAGE: 3.04 V
MDC IDC MSMT LEADCHNL RV PACING THRESHOLD PULSEWIDTH: 0.4 ms
MDC IDC MSMT LEADCHNL RV SENSING INTR AMPL: 13.125 mV
MDC IDC SESS DTM: 20160426143410
MDC IDC SET LEADCHNL RV SENSING SENSITIVITY: 0.3 mV
MDC IDC SET ZONE DETECTION INTERVAL: 300 ms
Zone Setting Detection Interval: 340 ms
Zone Setting Detection Interval: 360 ms

## 2014-05-23 ENCOUNTER — Other Ambulatory Visit: Payer: Self-pay | Admitting: Family Medicine

## 2014-05-23 NOTE — Telephone Encounter (Signed)
Electronic refill request, last refilled on 04/26/14 #30 with 0 refills

## 2014-05-24 MED ORDER — ALPRAZOLAM 1 MG PO TABS: 1.0000 mg | ORAL_TABLET | Freq: Every day | ORAL | Status: DC

## 2014-05-24 NOTE — Telephone Encounter (Signed)
Called to [facility] 

## 2014-06-06 ENCOUNTER — Encounter: Payer: Self-pay | Admitting: Cardiology

## 2014-06-09 ENCOUNTER — Telehealth: Payer: Self-pay | Admitting: Family Medicine

## 2014-06-09 DIAGNOSIS — E785 Hyperlipidemia, unspecified: Secondary | ICD-10-CM

## 2014-06-09 DIAGNOSIS — M1A49X0 Other secondary chronic gout, multiple sites, without tophus (tophi): Secondary | ICD-10-CM

## 2014-06-09 NOTE — Telephone Encounter (Signed)
-----   Message from Alvina Chou sent at 06/06/2014  4:57 PM EDT ----- Regarding: Lab orders for Monday, 5.16.16 Patient is scheduled for CPX labs, please order future labs, Thanks , Camelia Eng

## 2014-06-11 ENCOUNTER — Other Ambulatory Visit (INDEPENDENT_AMBULATORY_CARE_PROVIDER_SITE_OTHER): Payer: Medicare HMO

## 2014-06-11 DIAGNOSIS — M1A49X0 Other secondary chronic gout, multiple sites, without tophus (tophi): Secondary | ICD-10-CM

## 2014-06-11 DIAGNOSIS — M1049 Other secondary gout, multiple sites: Secondary | ICD-10-CM | POA: Diagnosis not present

## 2014-06-11 DIAGNOSIS — E785 Hyperlipidemia, unspecified: Secondary | ICD-10-CM

## 2014-06-11 LAB — COMPREHENSIVE METABOLIC PANEL
ALBUMIN: 3.9 g/dL (ref 3.5–5.2)
ALT: 35 U/L (ref 0–53)
AST: 42 U/L — AB (ref 0–37)
Alkaline Phosphatase: 100 U/L (ref 39–117)
BUN: 26 mg/dL — ABNORMAL HIGH (ref 6–23)
CO2: 22 mEq/L (ref 19–32)
Calcium: 9.5 mg/dL (ref 8.4–10.5)
Chloride: 105 mEq/L (ref 96–112)
Creatinine, Ser: 1.35 mg/dL (ref 0.40–1.50)
GFR: 54.99 mL/min — AB (ref >60.00)
Glucose, Bld: 71 mg/dL (ref 70–99)
POTASSIUM: 4 meq/L (ref 3.5–5.1)
Sodium: 137 mEq/L (ref 135–145)
Total Bilirubin: 1.2 mg/dL (ref 0.2–1.2)
Total Protein: 6.8 g/dL (ref 6.0–8.3)

## 2014-06-11 LAB — LIPID PANEL
CHOLESTEROL: 72 mg/dL (ref 0–200)
HDL: 26.7 mg/dL — AB (ref >39.00)
LDL Cholesterol: 31 mg/dL (ref 0–99)
NonHDL: 45.3
TRIGLYCERIDES: 70 mg/dL (ref 0.0–149.0)
Total CHOL/HDL Ratio: 3
VLDL: 14 mg/dL (ref 0.0–40.0)

## 2014-06-11 LAB — URIC ACID: Uric Acid, Serum: 7.5 mg/dL (ref 4.0–7.8)

## 2014-06-12 ENCOUNTER — Encounter: Payer: Self-pay | Admitting: Internal Medicine

## 2014-06-18 ENCOUNTER — Ambulatory Visit (INDEPENDENT_AMBULATORY_CARE_PROVIDER_SITE_OTHER): Payer: Medicare HMO | Admitting: Family Medicine

## 2014-06-18 ENCOUNTER — Encounter: Payer: Self-pay | Admitting: Family Medicine

## 2014-06-18 VITALS — BP 100/54 | HR 70 | Temp 97.6°F | Ht 68.5 in | Wt 165.8 lb

## 2014-06-18 DIAGNOSIS — R740 Nonspecific elevation of levels of transaminase and lactic acid dehydrogenase [LDH]: Secondary | ICD-10-CM | POA: Diagnosis not present

## 2014-06-18 DIAGNOSIS — M1009 Idiopathic gout, multiple sites: Secondary | ICD-10-CM | POA: Diagnosis not present

## 2014-06-18 DIAGNOSIS — Z0000 Encounter for general adult medical examination without abnormal findings: Secondary | ICD-10-CM | POA: Diagnosis not present

## 2014-06-18 DIAGNOSIS — R7401 Elevation of levels of liver transaminase levels: Secondary | ICD-10-CM

## 2014-06-18 DIAGNOSIS — E785 Hyperlipidemia, unspecified: Secondary | ICD-10-CM | POA: Diagnosis not present

## 2014-06-18 DIAGNOSIS — Z7189 Other specified counseling: Secondary | ICD-10-CM | POA: Insufficient documentation

## 2014-06-18 MED ORDER — ALPRAZOLAM 1 MG PO TABS: 1.0000 mg | ORAL_TABLET | Freq: Every day | ORAL | Status: DC

## 2014-06-18 MED ORDER — POTASSIUM CHLORIDE ER 10 MEQ PO TBCR: 10.0000 meq | EXTENDED_RELEASE_TABLET | ORAL | Status: DC

## 2014-06-18 MED ORDER — FUROSEMIDE 20 MG PO TABS: 20.0000 mg | ORAL_TABLET | ORAL | Status: DC

## 2014-06-18 MED ORDER — NIACIN ER (ANTIHYPERLIPIDEMIC) 500 MG PO TBCR: 500.0000 mg | EXTENDED_RELEASE_TABLET | Freq: Every day | ORAL | Status: DC

## 2014-06-18 MED ORDER — SIMVASTATIN 40 MG PO TABS: 40.0000 mg | ORAL_TABLET | Freq: Every day | ORAL | Status: DC

## 2014-06-18 MED ORDER — ALLOPURINOL 100 MG PO TABS: 100.0000 mg | ORAL_TABLET | Freq: Every day | ORAL | Status: DC

## 2014-06-18 MED ORDER — LISINOPRIL 10 MG PO TABS: 10.0000 mg | ORAL_TABLET | Freq: Every morning | ORAL | Status: DC

## 2014-06-18 NOTE — Patient Instructions (Addendum)
To try to increase protective HDL.. Increase exercise as tolerated and replace any animal fats with veggie fats like olive oil, avocado, almond.  Work on low uric acid.  Continue allopurinol.

## 2014-06-18 NOTE — Assessment & Plan Note (Signed)
Stable

## 2014-06-18 NOTE — Addendum Note (Signed)
Addended byKerby Nora E on: 06/18/2014 02:52 PM   Modules accepted: SmartSet

## 2014-06-18 NOTE — Assessment & Plan Note (Signed)
LDL at goal on simvastatin.  

## 2014-06-18 NOTE — Assessment & Plan Note (Signed)
No flares, slight increase in uric acid. Work on low uric acid diet.

## 2014-06-18 NOTE — Progress Notes (Signed)
Pre visit review using our clinic review tool, if applicable. No additional management support is needed unless otherwise documented below in the visit note. 

## 2014-06-18 NOTE — Progress Notes (Addendum)
The patient is here for annual wellness exam and preventative care.   Elevated Cholesterol:  Well controlled on simvastatin Goal < 70. Lab Results  Component Value Date   CHOL 72 06/11/2014   HDL 26.70* 06/11/2014   LDLCALC 31 06/11/2014   TRIG 70.0 06/11/2014   CHOLHDL 3 06/11/2014  Using medications without problems: rare fluishing Muscle aches: None Diet compliance:working low chol diet. Exercise: walking Other complaints:   CAD, CHF: Seen cards [facility] 08/2013.stable on current meds  History of ICM s/p ICD, CAD s/p CABG Followed by Dr. Ladona Ridgel 10/2013 He has been mowing his grass without chest pain or SOB. No ICD events. No orthopnea, PND, lower ext edema.  Wt Readings from Last 3 Encounters:  06/18/14 165 lb 12 oz (75.184 kg)  11/14/13 167 lb 6.4 oz (75.932 kg)  09/20/13 169 lb (76.658 kg)  He has been using lasix daily , he has increased it if weight is above goal.  Prediabetes: improved. Limiting sweets some.  Walking 2 miles every morning.  Work part-time at [employer].   Anxiety/insomnia, well controlled..using Alprazolam 1 mg nightly for insomnia.  Main issue is that coreg "jacks him up".  No daytime anxiety, no depression.  No SI.   Gout: No flares in last year. Uric acid 7.5, on low uric acid diet and low dose allopurinol.  Review of Systems  Constitutional: Negative for fever, fatigue and unexpected weight change.  HENT: Negative for ear pain, congestion, sore throat rhinorrhea, trouble swallowing and postnasal drip.  Eyes: Negative for pain.  Respiratory: Negative for cough, stable shortness of breath and no wheezing.  Cardiovascular: Negative for chest pain, palpitations and leg swelling.  Gastrointestinal: Negative for nausea, abdominal pain, diarrhea, constipation and blood in stool.  Genitourinary: Negative for dysuria, urgency, hematuria, discharge, penile swelling, scrotal swelling, difficulty urinating, penile pain and testicular pain.   Skin: Negative for rash.  Neurological: Negative for syncope, weakness, light-headedness, numbness and headaches.  Psychiatric/Behavioral: Negative for behavioral problems and dysphoric mood. The patient is not nervous/anxious.  Objective:   Physical Exam  Constitutional: He appears well-developed and well-nourished. Non-toxic appearance. He does not appear ill. No distress.  HENT:  Head: Normocephalic and atraumatic.  Right Ear: Hearing, tympanic membrane, external ear and ear canal normal.  Left Ear: Hearing, tympanic membrane, external ear and ear canal normal.  Nose: Nose normal.  Mouth/Throat: Uvula is midline, oropharynx is clear and moist and mucous membranes are normal.  Eyes: Conjunctivae, EOM and lids are normal. Pupils are equal, round, and reactive to light. No foreign bodies found.  Neck: Trachea normal, normal range of motion and phonation normal. Neck supple. Carotid bruit is not present. No mass and no thyromegaly present.  Cardiovascular:  Normal rate, regular rhythm, S1 normal, S2 normal, intact distal pulses and normal pulses. Exam reveals no gallop.  No murmur heard.  Pulmonary/Chest: Breath sounds normal. He has no wheezes. He has no rhonchi. He has no rales.  Abdominal: Soft. Normal appearance and bowel sounds are normal. There is no hepatosplenomegaly. There is no tenderness. There is no rebound, no guarding and no CVA tenderness. No hernia. Hernia confirmed negative in the right inguinal area and confirmed negative in the left inguinal area.  Genitourinary:Not examined. Lymphadenopathy:  He has no cervical adenopathy.  Right: No inguinal adenopathy present.  Left: No inguinal adenopathy present.  Neurological: He is alert. He has normal strength and normal reflexes. No cranial nerve deficit or sensory deficit. Gait normal.  Skin: Skin is  warm, dry and intact. No rash noted.  Psychiatric: He has a normal mood and affect. His speech is normal and  behavior is normal. Judgment normal.  Assessment & Plan:   Complete Physical Exam  The patient's preventative maintenance and recommended screening tests for an annual wellness exam were reviewed in full today.  Brought up to date unless services declined.  Counselled on the importance of diet, exercise, and its role in overall health and mortality.  The patient's FH and SH was reviewed, including their home life, tobacco status, and drug and alcohol status.   Vaccines: PNA, Td uptodate, Shingles: not interested Colon: 07/20/2012 Dr. Kinnie Scales, repeat in 5 years. Prostate: not indicated, pt agreeable.  Nonsmoker Body mass index is 24.83 kg/(m^2).

## 2014-07-19 ENCOUNTER — Other Ambulatory Visit: Payer: Self-pay | Admitting: Family Medicine

## 2014-07-19 NOTE — Telephone Encounter (Signed)
Prescription called to pharmacy.

## 2014-07-19 NOTE — Telephone Encounter (Signed)
CPE on 06/18/14, refilled #30 at this visit.

## 2014-07-22 ENCOUNTER — Other Ambulatory Visit: Payer: Self-pay | Admitting: *Deleted

## 2014-07-22 NOTE — Telephone Encounter (Signed)
Ok to refill 

## 2014-07-23 ENCOUNTER — Encounter: Payer: Self-pay | Admitting: Family Medicine

## 2014-07-23 ENCOUNTER — Ambulatory Visit (INDEPENDENT_AMBULATORY_CARE_PROVIDER_SITE_OTHER): Payer: Medicare HMO | Admitting: Family Medicine

## 2014-07-23 VITALS — BP 90/60 | HR 73 | Temp 97.7°F | Ht 68.5 in | Wt 168.2 lb

## 2014-07-23 DIAGNOSIS — B029 Zoster without complications: Secondary | ICD-10-CM | POA: Diagnosis not present

## 2014-07-23 MED ORDER — VALACYCLOVIR HCL 1 G PO TABS: 1000.0000 mg | ORAL_TABLET | Freq: Three times a day (TID) | ORAL | Status: DC

## 2014-07-23 NOTE — Progress Notes (Signed)
Pre visit review using our clinic review tool, if applicable. No additional management support is needed unless otherwise documented below in the visit note. 

## 2014-07-23 NOTE — Progress Notes (Signed)
   Subjective:    Patient ID: [name] Roberts, male    DOB: 1940/03/26, 74 y.o.   MRN: [id number]  HPI  74 year old male with history of  Ischemic cardiomyopathy, CAD, gout and high cholesterol presents with new rash 4 days ago on right side. Red papules, bisters, no new blisters. Very sensitive to touch. Painful, mild to moderate.  No flu like symptoms. Good energy. No known exposure.  Applied cortisone cream.. Helped some.  Has not used anything for pain.  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   Has history of shingles on right face in past.    Review of Systems  Constitutional: Negative for fever and fatigue.  HENT: Negative for ear pain.   Eyes: Negative for pain.  Respiratory: Negative for cough and shortness of breath.   Cardiovascular: Negative for chest pain.       Objective:   Physical Exam  Constitutional: Vital signs are normal. He appears well-developed and well-nourished.  HENT:  Head: Normocephalic.  Right Ear: Hearing normal.  Left Ear: Hearing normal.  Nose: Nose normal.  Mouth/Throat: Oropharynx is clear and moist and mucous membranes are normal.  Neck: Trachea normal. Carotid bruit is not present. No thyroid mass and no thyromegaly present.  Cardiovascular: Normal rate, regular rhythm and normal pulses.  Exam reveals no gallop, no distant heart sounds and no friction rub.   No murmur heard. No peripheral edema  Pulmonary/Chest: Effort normal and breath sounds normal. No respiratory distress.  Skin: Skin is warm, dry and intact. Rash noted. Rash is macular and vesicular.  Red macules with central vesicle, very tender to touch. On right abdomen , does not cross the midline.  Psychiatric: He has a normal mood and affect. His speech is normal and behavior is normal. Thought content normal.          Assessment & Plan:

## 2014-07-23 NOTE — Assessment & Plan Note (Signed)
Treat with antiviral and control pain. Discussed progression, recurrence, complications etc.

## 2014-07-23 NOTE — Patient Instructions (Signed)
Complete antiviral.  Control pain with tylenol or ibuprofen. Call if pain is not controlled.   Avoid immunocompromised people or pregnant women until rash scabbed.   Shingles Shingles (herpes zoster) is an infection that is caused by the same virus that causes chickenpox (varicella). The infection causes a painful skin rash and fluid-filled blisters, which eventually break open, crust over, and heal. It may occur in any area of the body, but it usually affects only one side of the body or face. The pain of shingles usually lasts about 1 month. However, some people with shingles may develop long-term (chronic) pain in the affected area of the body. Shingles often occurs many years after the person had chickenpox. It is more common:  In people older than 50 years.  In people with weakened immune systems, such as those with HIV, AIDS, or cancer.  In people taking medicines that weaken the immune system, such as transplant medicines.  In people under great stress. CAUSES  Shingles is caused by the varicella zoster virus (VZV), which also causes chickenpox. After a person is infected with the virus, it can remain in the person's body for years in an inactive state (dormant). To cause shingles, the virus reactivates and breaks out as an infection in a nerve root. The virus can be spread from person to person (contagious) through contact with open blisters of the shingles rash. It will only spread to people who have not had chickenpox. When these people are exposed to the virus, they may develop chickenpox. They will not develop shingles. Once the blisters scab over, the person is no longer contagious and cannot spread the virus to others. SIGNS AND SYMPTOMS  Shingles shows up in stages. The initial symptoms may be pain, itching, and tingling in an area of the skin. This pain is usually described as burning, stabbing, or throbbing.In a few days or weeks, a painful red rash will appear in the area  where the pain, itching, and tingling were felt. The rash is usually on one side of the body in a band or belt-like pattern. Then, the rash usually turns into fluid-filled blisters. They will scab over and dry up in approximately 2-3 weeks. Flu-like symptoms may also occur with the initial symptoms, the rash, or the blisters. These may include:  Fever.  Chills.  Headache.  Upset stomach. DIAGNOSIS  Your health care provider will perform a skin exam to diagnose shingles. Skin scrapings or fluid samples may also be taken from the blisters. This sample will be examined under a microscope or sent to a lab for further testing. TREATMENT  There is no specific cure for shingles. Your health care provider will likely prescribe medicines to help you manage the pain, recover faster, and avoid long-term problems. This may include antiviral drugs, anti-inflammatory drugs, and pain medicines. HOME CARE INSTRUCTIONS   Take a cool bath or apply cool compresses to the area of the rash or blisters as directed. This may help with the pain and itching.   Take medicines only as directed by your health care provider.   Rest as directed by your health care provider.  Keep your rash and blisters clean with mild soap and cool water or as directed by your health care provider.  Do not pick your blisters or scratch your rash. Apply an anti-itch cream or numbing creams to the affected area as directed by your health care provider.  Keep your shingles rash covered with a loose bandage (dressing).  Avoid skin  contact with:  Babies.   Pregnant women.   Children with eczema.   Elderly people with transplants.   People with chronic illnesses, such as leukemia or AIDS.   Wear loose-fitting clothing to help ease the pain of material rubbing against the rash.  Keep all follow-up visits as directed by your health care provider.If the area involved is on your face, you may receive a referral for a  specialist, such as an eye doctor (ophthalmologist) or an ear, nose, and throat (ENT) doctor. Keeping all follow-up visits will help you avoid eye problems, chronic pain, or disability.  SEEK IMMEDIATE MEDICAL CARE IF:   You have facial pain, pain around the eye area, or loss of feeling on one side of your face.  You have ear pain or ringing in your ear.  You have loss of taste.  Your pain is not relieved with prescribed medicines.   Your redness or swelling spreads.   You have more pain and swelling.  Your condition is worsening or has changed.   You have a fever. MAKE SURE YOU:  Understand these instructions.  Will watch your condition.  Will get help right away if you are not doing well or get worse. Document Released: 01/11/2005 Document Revised: 05/28/2013 Document Reviewed: 08/26/2011 [facility] Patient Information 2015 [place], [place]. This information is not intended to replace advice given to you by your health care provider. Make sure you discuss any questions you have with your health care provider.

## 2014-08-16 ENCOUNTER — Other Ambulatory Visit: Payer: Self-pay | Admitting: Family Medicine

## 2014-08-16 ENCOUNTER — Other Ambulatory Visit: Payer: Self-pay

## 2014-08-16 ENCOUNTER — Ambulatory Visit (INDEPENDENT_AMBULATORY_CARE_PROVIDER_SITE_OTHER): Payer: Medicare HMO | Admitting: Internal Medicine

## 2014-08-16 ENCOUNTER — Encounter: Payer: Self-pay | Admitting: Internal Medicine

## 2014-08-16 VITALS — BP 82/60 | HR 62 | Ht 68.5 in | Wt 170.2 lb

## 2014-08-16 DIAGNOSIS — Z95810 Presence of automatic (implantable) cardiac defibrillator: Secondary | ICD-10-CM

## 2014-08-16 DIAGNOSIS — I5022 Chronic systolic (congestive) heart failure: Secondary | ICD-10-CM | POA: Diagnosis not present

## 2014-08-16 LAB — CUP PACEART INCLINIC DEVICE CHECK
Battery Remaining Longevity: 133 mo
Brady Statistic RV Percent Paced: 1.93 %
Date Time Interrogation Session: 20160722143526
HighPow Impedance: 190 Ohm
HighPow Impedance: 59 Ohm
Lead Channel Pacing Threshold Amplitude: 0.5 V
Lead Channel Pacing Threshold Pulse Width: 0.4 ms
Lead Channel Sensing Intrinsic Amplitude: 10.625 mV
Lead Channel Sensing Intrinsic Amplitude: 10.75 mV
Lead Channel Setting Pacing Amplitude: 2.5 V
Lead Channel Setting Pacing Pulse Width: 0.4 ms
MDC IDC MSMT BATTERY VOLTAGE: 3.01 V
MDC IDC MSMT LEADCHNL RV IMPEDANCE VALUE: 437 Ohm
MDC IDC SET LEADCHNL RV SENSING SENSITIVITY: 0.3 mV
Zone Setting Detection Interval: 300 ms
Zone Setting Detection Interval: 340 ms
Zone Setting Detection Interval: 360 ms

## 2014-08-16 NOTE — Assessment & Plan Note (Signed)
His symptoms are class 2A. He will continue his current meds.  

## 2014-08-16 NOTE — Telephone Encounter (Signed)
Last office visit 07/23/2014.  Last refilled 07/19/2014 for #30 with no refills.  Ok to refill?

## 2014-08-16 NOTE — Progress Notes (Signed)
HPI [name] returns today for followup. He is a very pleasant 74 year old man with an ischemic cardiomyopathy, chronic systolic heart failure, status post ICD implantation who underwent ICD generator change with a new ICD lead placed as the patient had an indwelling 6949 lead capped.  In the interim, he has done well. He is occaisionaly having weight gain and sob for which he will take additional lasix and the weight returns to normal and breathing improves. His heart failure is class 2B. He has had no other complaints.    No Known Allergies   Current Outpatient Prescriptions  Medication Sig Dispense Refill  . allopurinol (ZYLOPRIM) 100 MG tablet Take 1 tablet (100 mg total) by mouth daily. 90 tablet 3  . aspirin EC 81 MG tablet Take 81 mg by mouth daily.    . carvedilol (COREG) 6.25 MG tablet TAKE ONE TABLET BY MOUTH TWICE DAILY WITH MEALS 180 tablet 1  . furosemide (LASIX) 20 MG tablet Take 1-2 tablets (20-40 mg total) by mouth See admin instructions. *Takes 20mg  daily with potassium tablet, but will take another furosemide 20mg  along with another potassium if needed based on weight* (Patient taking differently: Take 20-40 mg by mouth See admin instructions. *Takes 20mg  by mouth daily with potassium tablet, but will take another furosemide 20mg  along with another potassium daily if needed based on weight*) 45 tablet 3  . latanoprost (XALATAN) 0.005 % ophthalmic solution Place 1 drop into the left eye at bedtime.     Marland Kitchen lisinopril (PRINIVIL,ZESTRIL) 10 MG tablet Take 1 tablet (10 mg total) by mouth every morning. 90 tablet 3  . LOTEMAX 0.5 % GEL Apply 1 application topically 2 (two) times daily.     . niacin (NIASPAN) 500 MG CR tablet Take 1 tablet (500 mg total) by mouth at bedtime. 90 tablet 3  . polyvinyl alcohol-povidone (REFRESH) 1.4-0.6 % ophthalmic solution Place 1 drop into the left eye daily as needed (for irritation).     . potassium chloride (K-DUR) 10 MEQ  tablet Take 1-2 tablets (10-20 mEq total) by mouth See admin instructions. *takes 10 MEQ daily with furosemide 20mg  tablet, but will take another furosemide 20mg  along with another potassium if needed based on weight* (Patient taking differently: Take 10-20 mEq by mouth See admin instructions. *takes 10 MEQ by mouth daily with furosemide 20mg  tablet, but will take another furosemide 20mg  along with another potassium daily if needed based on weight*) 45 tablet 3  . simvastatin (ZOCOR) 40 MG tablet Take 1 tablet (40 mg total) by mouth daily. 90 tablet 3  . ALPRAZolam (XANAX) 1 MG tablet TAKE 1 TABLET BY MOUTH AT BEDTIME. 30 tablet 0   No current facility-administered medications for this visit.     Past Medical History  Diagnosis Date  . Chronic systolic heart failure   . Cardiomyopathy, ischemic   . CAD (coronary artery disease)   . HLD (hyperlipidemia)   . Herpes zoster   . Gout   . Heart murmur   . CHF (congestive heart failure)   . Myocardial infarction 1981  . Pneumonia 12/20206  . History of blood transfusion     "S/P OHS"  . Anemia   . Iron deficiency anemia     "S/P OHS"  . Arthritis     "right thumb; posterior neck" (08/02/2013)    ROS:   All systems reviewed and negative except as noted in the HPI.   Past Surgical History  Procedure Laterality  Date  . Appendectomy    . Cardiac defibrillator placement  2007    Dr. Ladona Ridgel  . Icd generator change  08/02/2013    "w/new leads"  . Band hemorrhoidectomy    . Cardiac catheterization  191; 2007  . Aortic valve repair  2007    "put an O ring in it"  . Coronary artery bypass graft  2007    Dr. Barry Dienes  . Pilonidal cyst excision  1961  . Implantable cardioverter defibrillator (icd) generator change N/A 08/02/2013    Procedure: ICD GENERATOR CHANGE;  Surgeon: Marinus Maw, MD;  Location: [facility] CATH LAB;  Service: Cardiovascular;  Laterality: N/A;     Family History  Problem Relation Age of Onset  . Cancer Mother      brain cancer  . Heart disease Father     CAD AND CHF     History   Social History  . Marital Status: Married    Spouse Name: N/A  . Number of Children: 2  . Years of Education: N/A   Occupational History  . Not on file.   Social History Main Topics  . Smoking status: Former Smoker -- 2.50 packs/day for 56 years    Types: Cigarettes    Quit date: 01/18/2005  . Smokeless tobacco: Never Used  . Alcohol Use: Yes     Comment: 08/02/2013 "beer:  most weeks none; some weeks 2-3 glasses; same w/wine"  . Drug Use: Yes     Comment: "a little speed when I was much younger"  . Sexual Activity: No   Other Topics Concern  . Not on file   Social History Narrative   Exercise: 2 miles walking daily   Diet: Moderate...some fruit and veggies, but eats some meat/potatos.   Limited fast food.                 BP 82/60 mmHg  Pulse 62  Ht 5' 8.5" (1.74 m)  Wt 170 lb 3.2 oz (77.202 kg)  BMI 25.50 kg/m2  Physical Exam:  Well appearing 74 year old man, NAD HEENT: Unremarkable Neck:  7 cm JVD, no thyromegally Lungs:  Clear with no wheezes, rales, or rhonchi. Well-healed ICD incision. HEART:  Regular rate rhythm, no murmurs, no rubs, no clicks Abd:  soft, positive bowel sounds, no organomegally, no rebound, no guarding Ext:  2 plus pulses, no edema, no cyanosis, no clubbing Skin:  No rashes no nodules Neuro:  CN II through XII intact, motor grossly intact  DEVICE  Normal device function.  See PaceArt for details.   Assess/Plan:

## 2014-08-16 NOTE — Assessment & Plan Note (Signed)
His medtronic ICD is working normally. Will recheck in several months. 

## 2014-08-16 NOTE — Patient Instructions (Signed)
Medication Instructions:  Your physician recommends that you continue on your current medications as directed. Please refer to the Current Medication list given to you today.   Labwork: None ordered  Testing/Procedures: None ordered  Follow-Up: Your physician wants you to follow-up in: 12 months with Dr Taylor You will receive a reminder letter in the mail two months in advance. If you don't receive a letter, please call our office to schedule the follow-up appointment.  Remote monitoring is used to monitor your Pacemaker or ICD from home. This monitoring reduces the number of office visits required to check your device to one time per year. It allows us to keep an eye on the functioning of your device to ensure it is working properly. You are scheduled for a device check from home on 11/18/14. You may send your transmission at any time that day. If you have a wireless device, the transmission will be sent automatically. After your physician reviews your transmission, you will receive a postcard with your next transmission date.     Any Other Special Instructions Will Be Listed Below (If Applicable).   

## 2014-08-16 NOTE — Assessment & Plan Note (Signed)
He denies chest pain. He will continue his current meds. His blood pressure is low today but he is asymptomatic. "it is always low"

## 2014-08-16 NOTE — Telephone Encounter (Signed)
Called in to [facility]. 

## 2014-09-16 ENCOUNTER — Other Ambulatory Visit: Payer: Self-pay | Admitting: Family Medicine

## 2014-09-16 NOTE — Telephone Encounter (Signed)
Last office visit 07/23/2014.  Last refilled 08/16/2014 for #30 with no refills.  Ok to refill?

## 2014-09-16 NOTE — Telephone Encounter (Signed)
pt left v/m requesting status of alprazolam refill.Please advise.

## 2014-09-17 NOTE — Telephone Encounter (Addendum)
Called into [facility].  [name] Roberts notified prescription has been called into his pharmacy.

## 2014-09-27 ENCOUNTER — Ambulatory Visit (INDEPENDENT_AMBULATORY_CARE_PROVIDER_SITE_OTHER): Payer: Medicare HMO | Admitting: Family Medicine

## 2014-09-27 ENCOUNTER — Encounter: Payer: Self-pay | Admitting: Family Medicine

## 2014-09-27 VITALS — BP 112/64 | HR 69 | Temp 97.4°F | Ht 68.5 in | Wt 173.2 lb

## 2014-09-27 DIAGNOSIS — R188 Other ascites: Secondary | ICD-10-CM | POA: Insufficient documentation

## 2014-09-27 DIAGNOSIS — R609 Edema, unspecified: Secondary | ICD-10-CM | POA: Diagnosis not present

## 2014-09-27 DIAGNOSIS — I5022 Chronic systolic (congestive) heart failure: Secondary | ICD-10-CM | POA: Diagnosis not present

## 2014-09-27 DIAGNOSIS — R7401 Elevation of levels of liver transaminase levels: Secondary | ICD-10-CM

## 2014-09-27 DIAGNOSIS — R140 Abdominal distension (gaseous): Secondary | ICD-10-CM | POA: Diagnosis not present

## 2014-09-27 DIAGNOSIS — R600 Localized edema: Secondary | ICD-10-CM

## 2014-09-27 DIAGNOSIS — R740 Nonspecific elevation of levels of transaminase and lactic acid dehydrogenase [LDH]: Secondary | ICD-10-CM

## 2014-09-27 DIAGNOSIS — I2510 Atherosclerotic heart disease of native coronary artery without angina pectoris: Secondary | ICD-10-CM | POA: Diagnosis not present

## 2014-09-27 LAB — COMPREHENSIVE METABOLIC PANEL
ALT: 31 U/L (ref 9–46)
AST: 37 U/L — ABNORMAL HIGH (ref 10–35)
Albumin: 4 g/dL (ref 3.6–5.1)
Alkaline Phosphatase: 105 U/L (ref 40–115)
BUN: 30 mg/dL — AB (ref 7–25)
CO2: 26 mmol/L (ref 20–31)
Calcium: 9.7 mg/dL (ref 8.6–10.3)
Chloride: 106 mmol/L (ref 98–110)
Creat: 1.38 mg/dL — ABNORMAL HIGH (ref 0.70–1.18)
GLUCOSE: 107 mg/dL — AB (ref 65–99)
POTASSIUM: 5.2 mmol/L (ref 3.5–5.3)
Sodium: 143 mmol/L (ref 135–146)
Total Bilirubin: 1.4 mg/dL — ABNORMAL HIGH (ref 0.2–1.2)
Total Protein: 6.8 g/dL (ref 6.1–8.1)

## 2014-09-27 LAB — BRAIN NATRIURETIC PEPTIDE: Brain Natriuretic Peptide: 2451.2 pg/mL — ABNORMAL HIGH (ref 0.0–100.0)

## 2014-09-27 LAB — TSH: TSH: 5.521 u[IU]/mL — ABNORMAL HIGH (ref 0.350–4.500)

## 2014-09-27 MED ORDER — FUROSEMIDE 40 MG PO TABS: ORAL_TABLET | ORAL | Status: DC

## 2014-09-27 NOTE — Progress Notes (Signed)
Pre visit review using our clinic review tool, if applicable. No additional management support is needed unless otherwise documented below in the visit note. 

## 2014-09-27 NOTE — Patient Instructions (Signed)
Stopa t lab on way out.  We will call this evening with labs and recommendation for increasing lasix.  [place] follow up next Tuesday for re-eval.

## 2014-09-27 NOTE — Progress Notes (Signed)
Subjective:    Patient ID: [name] Roberts, male    DOB: [birth year], 74 y.o.   MRN: [id number]  HPI     74 year old male with history of gout, CHF, CAD and ischemic cardiomyopathy presents with new onset swelling in abdomen, tightness  In abdomen in last 4-6 weeks, gradually worsening. When he lays down causes pressure, uncomfortable breathing.  Trouble sleeping due to abdominal pressure. No new chest pain, no shortness of breath.   Increase in peripheral edema as well.  He feels it is due heat, he has not been walking s much..   Using  lasix 40  mg  20 days until last 7 days when he started to run out. Increased urination a little but not much.   10 lb weight gain in last few months. Wt Readings from Last 3 Encounters:  09/27/14 173 lb 4 oz (78.586 kg)  08/16/14 170 lb 3.2 oz (77.202 kg)  07/23/14 168 lb 4 oz (76.318 kg)   Last card OV 08/16/2014: no changes.  Former smoker. 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 He has had 3 beers in last month.   LFT in 05/2014 elevated. US showed no changes. Thought to be due to statin at the time.   Pt is veteran, has tattoos, cannot find hep C test in chart.   Review of Systems  Constitutional: Positive for fatigue.  HENT: Negative for ear pain.   Eyes: Negative for pain.  Respiratory: Positive for shortness of breath. Negative for cough.   Cardiovascular: Positive for leg swelling. Negative for chest pain and palpitations.  Gastrointestinal: Positive for abdominal distention. Negative for abdominal pain.  Neurological: Negative for syncope and weakness.       No sedation       Objective:   Physical Exam  Constitutional: Vital signs are normal. He appears well-developed and well-nourished.  HENT:  Head: Normocephalic.  Right Ear: Hearing normal.  Left Ear: Hearing normal.  Nose: Nose normal.  Mouth/Throat: Oropharynx is clear and moist and mucous membranes are normal.  Neck: Trachea normal. Carotid bruit  is not present. No thyroid mass and no thyromegaly present.  Cardiovascular: Normal rate, regular rhythm and normal pulses.  Exam reveals no gallop, no distant heart sounds and no friction rub.   Murmur heard.  Systolic murmur is present  No peripheral edema   ? New murmur.. none noted at last OV with cards.  Pulmonary/Chest: Effort normal. No respiratory distress. He has rales in the left lower field.  Abdominal: He exhibits distension and fluid wave. He exhibits no shifting dullness. There is hepatomegaly. There is no splenomegaly. There is no tenderness. There is no CVA tenderness. No hernia.  Central obesity Possible mild hepatomegaly.  Skin: Skin is warm, dry and intact. No rash noted.  Psychiatric: He has a normal mood and affect. His speech is normal and behavior is normal. Thought content normal.          Assessment & Plan:   Pt with new onset swelling in ankles, abdominal distention causing difficulty taking deep breaths.  Pt has significant reason for fluid overload given CAD, CHF and ischemic cardiomyopathy as well as recent inactivity and hot weather, but concern for liver cause of edema given hs f increased LFTS, ? Hepatomegaly, abdominal distention and possible ascites.  Will send for labs to eval liver as well as eval kidney function to determine if increase in lasix possible .EKG was stable today compared to past EKGs  earlier in year.  Will eval TSH to make sure no new thyroid issue causing swelling.  Given concern for liver issues and past LFTs with tattoo and veteran status.. Will check Hep C ( could not find in chart if this was done in 2010).  May need repeat US liver/abdomen.  Will call pt tonight with labs results. Likely will increase lasix to 80 mg daily for few days, follow up early next week for weight check.

## 2014-09-28 LAB — HEPATITIS C ANTIBODY: HCV Ab: NEGATIVE

## 2014-10-01 ENCOUNTER — Telehealth: Payer: Self-pay | Admitting: Family Medicine

## 2014-10-01 NOTE — Telephone Encounter (Signed)
Great.. Sounds like the higher dose of lasix is helping with the swelling. How does he feel? Breathing better?   Ask pt what he believes his baseline weight was before all the fluids added up? We will need to check his kidney function BMET ideally today but could be tommorow.  This will help me determine if we can continue the high dose lasix longer.

## 2014-10-01 NOTE — Telephone Encounter (Signed)
Patient received a call from [facility] on Saturday asking him to call back today with his weights.  Patient's weights are Saturday morning-170.8, Sunday Morning-168.7, Monday morning-167.4,Tuesday morning-166.6.

## 2014-10-01 NOTE — Telephone Encounter (Signed)
Left message for [name] Roberts to return my call.

## 2014-10-01 NOTE — Telephone Encounter (Signed)
Left message at home number for Mr. Becken to return my call.

## 2014-10-02 ENCOUNTER — Other Ambulatory Visit (INDEPENDENT_AMBULATORY_CARE_PROVIDER_SITE_OTHER): Payer: Medicare HMO

## 2014-10-02 ENCOUNTER — Other Ambulatory Visit: Payer: Self-pay | Admitting: Internal Medicine

## 2014-10-02 DIAGNOSIS — I5022 Chronic systolic (congestive) heart failure: Secondary | ICD-10-CM | POA: Diagnosis not present

## 2014-10-02 LAB — BASIC METABOLIC PANEL
BUN: 40 mg/dL — AB (ref 6–23)
CALCIUM: 9.6 mg/dL (ref 8.4–10.5)
CO2: 30 mEq/L (ref 19–32)
CREATININE: 1.84 mg/dL — AB (ref 0.40–1.50)
Chloride: 102 mEq/L (ref 96–112)
GFR: 38.44 mL/min — AB (ref >60.00)
GLUCOSE: 89 mg/dL (ref 70–99)
Potassium: 4.8 mEq/L (ref 3.5–5.1)
SODIUM: 141 meq/L (ref 135–145)

## 2014-10-02 NOTE — Telephone Encounter (Signed)
[name] Roberts notified as instructed by telephone.  He states his base weight is 160-161 lbs.  He states he is feeling and breathing better.  Lab appointment scheduled today 10/02/2014 at 1:00 pm for BMET.

## 2014-10-17 ENCOUNTER — Other Ambulatory Visit: Payer: Self-pay | Admitting: Family Medicine

## 2014-10-17 ENCOUNTER — Other Ambulatory Visit: Payer: Self-pay | Admitting: *Deleted

## 2014-10-17 MED ORDER — ALPRAZOLAM 1 MG PO TABS: 1.0000 mg | ORAL_TABLET | Freq: Every day | ORAL | Status: DC

## 2014-10-17 NOTE — Telephone Encounter (Signed)
Pt left v/m requesting cb about status of alprazolam refill. 

## 2014-10-17 NOTE — Telephone Encounter (Addendum)
Alprazolam called into [facility].  Mr. Moe notified. 

## 2014-10-17 NOTE — Telephone Encounter (Deleted)
Alprazolam called into [facility].

## 2014-10-17 NOTE — Telephone Encounter (Signed)
Last office visit 09/27/2014.  Last refilled 09/17/2014 for #30 with no refills.  Ok to refill?

## 2014-10-25 ENCOUNTER — Ambulatory Visit (INDEPENDENT_AMBULATORY_CARE_PROVIDER_SITE_OTHER): Payer: Medicare HMO | Admitting: Cardiovascular Disease

## 2014-10-25 ENCOUNTER — Encounter: Payer: Self-pay | Admitting: Cardiovascular Disease

## 2014-10-25 VITALS — BP 110/64 | HR 70 | Ht 68.5 in | Wt 168.0 lb

## 2014-10-25 DIAGNOSIS — I255 Ischemic cardiomyopathy: Secondary | ICD-10-CM

## 2014-10-25 DIAGNOSIS — I2510 Atherosclerotic heart disease of native coronary artery without angina pectoris: Secondary | ICD-10-CM | POA: Diagnosis not present

## 2014-10-25 DIAGNOSIS — I5022 Chronic systolic (congestive) heart failure: Secondary | ICD-10-CM

## 2014-10-25 MED ORDER — LOSARTAN POTASSIUM 50 MG PO TABS: 50.0000 mg | ORAL_TABLET | Freq: Every day | ORAL | Status: DC

## 2014-10-25 NOTE — Patient Instructions (Signed)
Medication Instructions:  Your physician has recommended you make the following change in your medication:  Stop Lisinopril. Start Cozaar 50 mg by mouth daily.    Labwork: none  Testing/Procedures: none  Follow-Up: Your physician wants you to follow-up in: 12 months.  You will receive a reminder letter in the mail two months in advance. If you don't receive a letter, please call our office to schedule the follow-up appointment.   Any Other Special Instructions Will Be Listed Below (If Applicable).

## 2014-10-25 NOTE — Progress Notes (Signed)
Chief Complaint  Patient presents with  . Cough     History of Present Illness: 74 yo WM with history of ICM s/p ICD, CAD s/p CABG, hyperlipidemia, mitral valve disease here today for routine cardiac follow up. He underwent 3V CABG (LIMA to LAD, SVG to Diagonal, SVG to Circumflex) in 2007 along with mitral valve ring per Dr. Cornelius Moras. Echo 8/14 with EF of 20%. LV was dilated. His ICD is followed by Dr. Sharrell Ku. His ICD was updated July 2015. He was seen in primary care recently c/o abdominal swelling. Lasix was increased.   He has been doing well since his Lasix was increased. No complaints. No ICD events. No chest pain or SOB. No orthopnea, PND, lower extremity edema. He has a dry cough for weeks. No fever, chills.   Primary Care Physician: Ermalene Searing  Last Lipid Profile:Lipid Panel     Component Value Date/Time   CHOL 72 06/11/2014 0838   TRIG 70.0 06/11/2014 0838   HDL 26.70* 06/11/2014 0838   CHOLHDL 3 06/11/2014 0838   VLDL 14.0 06/11/2014 0838   LDLCALC 31 06/11/2014 0838    Past Medical History  Diagnosis Date  . Chronic systolic heart failure   . Cardiomyopathy, ischemic   . CAD (coronary artery disease)   . HLD (hyperlipidemia)   . Herpes zoster   . Gout   . Heart murmur   . CHF (congestive heart failure)   . Myocardial infarction 1981  . Pneumonia 12/20206  . History of blood transfusion     "S/P OHS"  . Anemia   . Iron deficiency anemia     "S/P OHS"  . Arthritis     "right thumb; posterior neck" (08/02/2013)    Past Surgical History  Procedure Laterality Date  . Appendectomy    . Cardiac defibrillator placement  2007    Dr. Ladona Ridgel  . Icd generator change  08/02/2013    "w/new leads"  . Band hemorrhoidectomy    . Cardiac catheterization  191; 2007  . Aortic valve repair  2007    "put an O ring in it"  . Coronary artery bypass graft  2007    Dr. Barry Dienes  . Pilonidal cyst excision  1961  . Implantable cardioverter defibrillator (icd) generator change N/A  08/02/2013    Procedure: ICD GENERATOR CHANGE;  Surgeon: Marinus Maw, MD;  Location: [facility] CATH LAB;  Service: Cardiovascular;  Laterality: N/A;    Current Outpatient Prescriptions  Medication Sig Dispense Refill  . allopurinol (ZYLOPRIM) 100 MG tablet Take 1 tablet (100 mg total) by mouth daily. 90 tablet 3  . ALPRAZolam (XANAX) 1 MG tablet Take 1 tablet (1 mg total) by mouth at bedtime. 30 tablet 0  . aspirin EC 81 MG tablet Take 81 mg by mouth daily.    . carvedilol (COREG) 6.25 MG tablet TAKE ONE TABLET BY MOUTH TWICE DAILY 180 tablet 1  . furosemide (LASIX) 40 MG tablet 40 mg daily, if told to by physician increase to 80 mg daily. If taking extra dose increase to 3 tabs of potassium daily. 60 tablet 3  . latanoprost (XALATAN) 0.005 % ophthalmic solution Place 1 drop into the left eye at bedtime.     . LOTEMAX 0.5 % GEL Apply 1 application topically 2 (two) times daily.     . niacin (NIASPAN) 500 MG CR tablet Take 1 tablet (500 mg total) by mouth at bedtime. 90 tablet 3  . polyvinyl alcohol-povidone (REFRESH) 1.4-0.6 % ophthalmic  solution Place 1 drop into the left eye daily as needed (for irritation).     . potassium chloride (K-DUR) 10 MEQ tablet Take 1-2 tablets (10-20 mEq total) by mouth See admin instructions. *takes 10 MEQ daily with furosemide 20mg  tablet, but will take another furosemide 20mg  along with another potassium if needed based on weight* (Patient taking differently: Take 10-20 mEq by mouth See admin instructions. *takes 10 MEQ by mouth daily with furosemide 20mg  tablet, but will take another furosemide 20mg  along with another potassium daily if needed based on weight*) 45 tablet 3  . simvastatin (ZOCOR) 40 MG tablet Take 1 tablet (40 mg total) by mouth daily. 90 tablet 3  . losartan (COZAAR) 50 MG tablet Take 1 tablet (50 mg total) by mouth daily. 30 tablet 11   No current facility-administered medications for this visit.    No Known Allergies  Social History     Social History  . Marital Status: Married    Spouse Name: N/A  . Number of Children: 2  . Years of Education: N/A   Occupational History  . Not on file.   Social History Main Topics  . Smoking status: Former Smoker -- 2.50 packs/day for 56 years    Types: Cigarettes    Quit date: 01/18/2005  . Smokeless tobacco: Never Used  . Alcohol Use: Yes     Comment: 08/02/2013 "beer:  most weeks none; some weeks 2-3 glasses; same w/wine"  . Drug Use: Yes     Comment: "a little speed when I was much younger"  . Sexual Activity: No   Other Topics Concern  . Not on file   Social History Narrative   Exercise: 2 miles walking daily   Diet: Moderate...some fruit and veggies, but eats some meat/potatos.   Limited fast food.                Family History  Problem Relation Age of Onset  . Cancer Mother     brain cancer  . Heart disease Father     CAD AND CHF    Review of Systems:  As stated in the HPI and otherwise negative.   BP 110/64 mmHg  Pulse 70  Ht 5' 8.5" (1.74 m)  Wt 168 lb (76.204 kg)  BMI 25.17 kg/m2  Physical Examination: General: Well developed, well nourished, NAD HEENT: OP clear, mucus membranes moist SKIN: warm, dry. No rashes. Neuro: No focal deficits Musculoskeletal: Muscle strength 5/5 all ext Psychiatric: Mood and affect normal Neck: No JVD, no carotid bruits, no thyromegaly, no lymphadenopathy. Lungs:Clear bilaterally, no wheezes, rhonci, crackles Cardiovascular: Regular rate and rhythm. No murmurs, gallops or rubs. Abdomen:Soft. Bowel sounds present. Non-tender.  Extremities: No lower extremity edema. Pulses are 2 + in the bilateral DP/PT.  Echo 09/01/12: Left ventricle: inferior/inferolateral/apical akinesis. Other walls severe hypokinesis. The cavity size was severely dilated. Wall thickness was normal. The estimated ejection fraction was 20%. Diffuse hypokinesis. Findings consistent with left ventricular diastolic dysfunction. Doppler  parameters are consistent with high ventricular filling pressure. - Mitral valve: Probable mitral ring post CABG. Mild regurgitation. - Left atrium: The atrium was moderately dilated. - Right ventricle: The cavity size was moderately dilated. Pacer wire or catheter noted in right ventricle. Systolic function was moderately reduced. - Tricuspid valve: Moderate regurgitation. - Pulmonary arteries: PA peak pressure: 59mm Hg (S).  EKG:  EKG is not ordered today. The ekg ordered today demonstrates   Recent Labs: 09/27/2014: ALT 31; TSH 5.521* 10/02/2014: BUN  40*; Creatinine, Ser 1.84*; Potassium 4.8; Sodium 141   Lipid Panel    Component Value Date/Time   CHOL 72 06/11/2014 0838   TRIG 70.0 06/11/2014 0838   HDL 26.70* 06/11/2014 0838   CHOLHDL 3 06/11/2014 0838   VLDL 14.0 06/11/2014 0838   LDLCALC 31 06/11/2014 0838     Wt Readings from Last 3 Encounters:  10/25/14 168 lb (76.204 kg)  09/27/14 173 lb 4 oz (78.586 kg)  08/16/14 170 lb 3.2 oz (77.202 kg)     Other studies Reviewed: Additional studies/ records that were reviewed today include: . Review of the above records demonstrates:    Assessment and Plan:   1. CAD: Stable. Continue current meds. No chest pain. BP and lipids are well controlled.       2. CHRONIC SYSTOLIC HEART FAILURE: Volume status is ok today. Continue current dose of Lasix. He will follow his weight every day and take extra lasix if needed.     3. ISCHEMIC CARDIOMYOPATHY: LVEF=20% by echo August 2014. No ICD events. He is on good medical therapy. Will change Lisinopril to Cozaar 50 mg daily with dry cough. Follow up with Dr. Ladona Ridgel as planned for ICD check.      Current medicines are reviewed at length with the patient today.  The patient does not have concerns regarding medicines.  The following changes have been made:  no change  Labs/ tests ordered today include:  No orders of the defined types were placed in this encounter.    Disposition:    FU with me in 12  months  Signed, Verne Carrow, MD 10/25/2014 1:08 PM    [facility] [address] [place], [place], [place]  [address] Phone: [phone]; Fax: [phone]

## 2014-11-06 ENCOUNTER — Other Ambulatory Visit: Payer: Self-pay | Admitting: Family Medicine

## 2014-11-07 ENCOUNTER — Ambulatory Visit (INDEPENDENT_AMBULATORY_CARE_PROVIDER_SITE_OTHER): Payer: Medicare HMO

## 2014-11-07 DIAGNOSIS — Z23 Encounter for immunization: Secondary | ICD-10-CM | POA: Diagnosis not present

## 2014-11-15 ENCOUNTER — Other Ambulatory Visit: Payer: Self-pay | Admitting: *Deleted

## 2014-11-15 MED ORDER — ALPRAZOLAM 1 MG PO TABS: 1.0000 mg | ORAL_TABLET | Freq: Every day | ORAL | Status: DC

## 2014-11-15 NOTE — Telephone Encounter (Signed)
Last office visit 09/27/2014.  Last refilled 10/17/2014 for #30 with no refills.  Ok to refill?

## 2014-11-15 NOTE — Telephone Encounter (Signed)
Alprazolam called in to [facility] 

## 2014-11-18 ENCOUNTER — Ambulatory Visit (INDEPENDENT_AMBULATORY_CARE_PROVIDER_SITE_OTHER): Payer: Medicare HMO | Admitting: *Deleted

## 2014-11-18 DIAGNOSIS — I5022 Chronic systolic (congestive) heart failure: Secondary | ICD-10-CM

## 2014-11-18 DIAGNOSIS — I255 Ischemic cardiomyopathy: Secondary | ICD-10-CM | POA: Diagnosis not present

## 2014-11-19 NOTE — Progress Notes (Signed)
LOOP RECORDER  

## 2014-11-20 NOTE — Progress Notes (Signed)
Error below --- Remote ICD transmission.   

## 2014-12-10 ENCOUNTER — Telehealth: Payer: Self-pay | Admitting: *Deleted

## 2014-12-10 LAB — CUP PACEART REMOTE DEVICE CHECK
Battery Remaining Longevity: 131 mo
Date Time Interrogation Session: 20161024113730
HIGH POWER IMPEDANCE MEASURED VALUE: 55 Ohm
Implantable Lead Implant Date: 20150709
Lead Channel Pacing Threshold Pulse Width: 0.4 ms
Lead Channel Sensing Intrinsic Amplitude: 8.75 mV
Lead Channel Setting Pacing Pulse Width: 0.4 ms
Lead Channel Setting Sensing Sensitivity: 0.3 mV
MDC IDC LEAD LOCATION: 753860
MDC IDC MSMT BATTERY VOLTAGE: 3.01 V
MDC IDC MSMT LEADCHNL RV IMPEDANCE VALUE: 304 Ohm
MDC IDC MSMT LEADCHNL RV IMPEDANCE VALUE: 399 Ohm
MDC IDC MSMT LEADCHNL RV PACING THRESHOLD AMPLITUDE: 0.625 V
MDC IDC MSMT LEADCHNL RV SENSING INTR AMPL: 8.75 mV
MDC IDC SET LEADCHNL RV PACING AMPLITUDE: 2.5 V
MDC IDC STAT BRADY RV PERCENT PACED: 0.46 %

## 2014-12-10 NOTE — Telephone Encounter (Signed)
LMTCB//sss 

## 2014-12-10 NOTE — Telephone Encounter (Signed)
Spoke to patient regarding abnormal thoracic impedance. Patient admits to [facility] and abdominal bloat, but denies LE edema, CP, or wt gain. Patient states that his lasix was adjusted a couple of months ago and he's been feeling better since then.  Will inform GT and notify patient of any recommendations.

## 2014-12-11 ENCOUNTER — Encounter: Payer: Self-pay | Admitting: Cardiology

## 2014-12-13 ENCOUNTER — Other Ambulatory Visit: Payer: Self-pay | Admitting: Family Medicine

## 2014-12-13 MED ORDER — POTASSIUM CHLORIDE ER 10 MEQ PO TBCR: EXTENDED_RELEASE_TABLET | ORAL | Status: DC

## 2014-12-13 NOTE — Telephone Encounter (Addendum)
Alprazolam called into [facility].  Mr. Hartunian notified.

## 2014-12-13 NOTE — Addendum Note (Signed)
Addended by: Damita Lack on: 12/13/2014 12:49 PM   Modules accepted: Orders

## 2014-12-13 NOTE — Telephone Encounter (Signed)
Pt left v/m requesting status of alprazolam refill; pt will be out of med on 12/15/14. Last refill #30 x 0 on 11/15/14. Last annual exam 06/18/14.

## 2014-12-17 ENCOUNTER — Encounter: Payer: Self-pay | Admitting: Family Medicine

## 2014-12-17 ENCOUNTER — Ambulatory Visit (INDEPENDENT_AMBULATORY_CARE_PROVIDER_SITE_OTHER): Payer: Medicare HMO | Admitting: Family Medicine

## 2014-12-17 VITALS — BP 126/72 | HR 66 | Temp 97.9°F | Ht 68.5 in | Wt 171.5 lb

## 2014-12-17 DIAGNOSIS — R059 Cough, unspecified: Secondary | ICD-10-CM

## 2014-12-17 DIAGNOSIS — R05 Cough: Secondary | ICD-10-CM | POA: Diagnosis not present

## 2014-12-17 MED ORDER — HYDROCODONE-HOMATROPINE 5-1.5 MG/5ML PO SYRP: 5.0000 mL | ORAL_SOLUTION | Freq: Three times a day (TID) | ORAL | Status: DC | PRN

## 2014-12-17 MED ORDER — FLUTICASONE PROPIONATE 50 MCG/ACT NA SUSP: 2.0000 | Freq: Every day | NASAL | Status: DC

## 2014-12-17 NOTE — Progress Notes (Signed)
Pre visit review using our clinic review tool, if applicable. No additional management support is needed unless otherwise documented below in the visit note. 

## 2014-12-17 NOTE — Assessment & Plan Note (Signed)
Cough appears to be from postnasal drip. Lung exam unremarkable. Treating with Hycodan for cough and Flonase as well.

## 2014-12-17 NOTE — Progress Notes (Signed)
   Subjective:  Patient ID: [name] Roberts, male    DOB: 03/07/1940  Age: 74 y.o. MRN: [id number]  CC: Cough  HPI:  74 year old male with accommodative past medical history including ischemic cardiomyopathy/chronic systolic heart failure, CAD presents with complaints of cough.  Patient reports that he's had a cough for the past 3 days. He states that it is brought on by postnasal drainage. He states that his cough is particularly worse at night. No exacerbating or relieving factors. No interventions tried. He states that he has baseline shortness of breath but has had no worsening of it. No associated fevers or chills. He states that he was urged to calm for evaluation by his wife.   Social Hx   Social History   Social History  . Marital Status: Married    Spouse Name: N/A  . Number of Children: 2  . Years of Education: N/A   Social History Main Topics  . Smoking status: Former Smoker -- 2.50 packs/day for 56 years    Types: Cigarettes    Quit date: 01/18/2005  . Smokeless tobacco: Never Used  . Alcohol Use: Yes     Comment: 08/02/2013 "beer:  most weeks none; some weeks 2-3 glasses; same w/wine"  . Drug Use: Yes     Comment: "a little speed when I was much younger"  . Sexual Activity: No   Other Topics Concern  . None   Social History Narrative   Exercise: 2 miles walking daily   Diet: Moderate...some fruit and veggies, but eats some meat/potatos.   Limited fast food.               Review of Systems  Constitutional: Negative for fever and chills.  HENT: Positive for postnasal drip.   Respiratory: Positive for cough.    Objective:  BP 126/72 mmHg  Pulse 66  Temp(Src) 97.9 F (36.6 C) (Oral)  Ht 5' 8.5" (1.74 m)  Wt 171 lb 8 oz (77.792 kg)  BMI 25.69 kg/m2  SpO2 98%  BP/Weight 12/17/2014 10/25/2014 09/27/2014  Systolic BP 126 110 112  Diastolic BP 72 64 64  Wt. (Lbs) 171.5 168 173.25  BMI 25.69 25.17 25.96   Physical Exam  Constitutional: He appears  well-developed. No distress.  HENT:  Head: Normocephalic and atraumatic.  Oropharynx with mild erythema.  Cardiovascular: Normal rate and regular rhythm.   Pulmonary/Chest: Effort normal and breath sounds normal. He has no wheezes. He has no rales.  Neurological: He is alert.  Psychiatric: He has a normal mood and affect.  Vitals reviewed.  Assessment & Plan:   Problem List Items Addressed This Visit    Cough - Primary    Cough appears to be from postnasal drip. Lung exam unremarkable. Treating with Hycodan for cough and Flonase as well.        Follow-up: PRN  Everlene Other, DO

## 2014-12-17 NOTE — Patient Instructions (Addendum)
Your cough is from post nasal drip. There is no evidence of bronchitis or an ongoing infection.  Use of Flonase as prescribed to help with the postnasal drip.  Use the Hycodan as needed for cough; start with it at night.  Follow up:  Return if symptoms worsen or fail to improve.  Take care  Dr. Adriana Simas

## 2014-12-22 NOTE — Telephone Encounter (Signed)
No medication changes. Watch weight carefully and avoid sodium. GT

## 2014-12-24 ENCOUNTER — Encounter: Payer: Self-pay | Admitting: Family Medicine

## 2014-12-24 ENCOUNTER — Ambulatory Visit (INDEPENDENT_AMBULATORY_CARE_PROVIDER_SITE_OTHER): Payer: Medicare HMO | Admitting: Family Medicine

## 2014-12-24 VITALS — BP 104/64 | HR 76 | Temp 97.3°F | Ht 68.5 in | Wt 173.8 lb

## 2014-12-24 DIAGNOSIS — R05 Cough: Secondary | ICD-10-CM | POA: Diagnosis not present

## 2014-12-24 DIAGNOSIS — R059 Cough, unspecified: Secondary | ICD-10-CM

## 2014-12-24 MED ORDER — AZITHROMYCIN 250 MG PO TABS: ORAL_TABLET | ORAL | Status: DC

## 2014-12-24 MED ORDER — POTASSIUM CHLORIDE ER 10 MEQ PO TBCR: EXTENDED_RELEASE_TABLET | ORAL | Status: DC

## 2014-12-24 NOTE — Progress Notes (Signed)
   Subjective:    Patient ID: [name] Roberts, male    DOB: 1940-11-27, 74 y.o.   MRN: [id number]  Cough This is a new ( Seen on 11/22 by Dr. Adriana Simas.  Dx with viral infeciton.  Given Hycodan for cough and Flonase) problem. The current episode started 1 to 4 weeks ago ( 10 days). The problem has been waxing and waning (" I don't feel bad, my wife made me come because I am not getting better like she thinks I should"). The problem occurs every few hours. The cough is productive of sputum. Associated symptoms include nasal congestion and postnasal drip. Pertinent negatives include no chills, ear congestion, ear pain, fever, sore throat, shortness of breath or wheezing. Associated symptoms comments: No sinus pressure. The symptoms are aggravated by lying down. Risk factors for lung disease include smoking/tobacco exposure ( Former heavy smoker with 120 pack year history.). He has tried prescription cough suppressant (Hycodan for cough and Flonase) for the symptoms. The treatment provided moderate relief. His past medical history is significant for environmental allergies. There is no history of asthma, COPD or emphysema.     Social History /Family History/Past Medical History reviewed and updated if needed.  CAD, CHF, cardiomyopathy. Review of Systems  Constitutional: Negative for fever and chills.  HENT: Positive for postnasal drip. Negative for ear pain and sore throat.   Respiratory: Positive for cough. Negative for shortness of breath and wheezing.   Allergic/Immunologic: Positive for environmental allergies.       Objective:   Physical Exam  Constitutional: Vital signs are normal. He appears well-developed and well-nourished.  Non-toxic appearance. He does not appear ill. No distress.  HENT:  Head: Normocephalic and atraumatic.  Right Ear: Hearing, tympanic membrane, external ear and ear canal normal. No tenderness. No foreign bodies. Tympanic membrane is not retracted and not bulging.    Left Ear: Hearing, tympanic membrane, external ear and ear canal normal. No tenderness. No foreign bodies. Tympanic membrane is not retracted and not bulging.  Nose: Nose normal. No mucosal edema or rhinorrhea. Right sinus exhibits no maxillary sinus tenderness and no frontal sinus tenderness. Left sinus exhibits no maxillary sinus tenderness and no frontal sinus tenderness.  Mouth/Throat: Uvula is midline, oropharynx is clear and moist and mucous membranes are normal. Normal dentition. No dental caries. No oropharyngeal exudate or tonsillar abscesses.  Eyes: Conjunctivae, EOM and lids are normal. Pupils are equal, round, and reactive to light. Lids are everted and swept, no foreign bodies found.  Neck: Trachea normal, normal range of motion and phonation normal. Neck supple. Carotid bruit is not present. No thyroid mass and no thyromegaly present.  Cardiovascular: Normal rate, regular rhythm, S1 normal, S2 normal, intact distal pulses and normal pulses.  Exam reveals distant heart sounds. Exam reveals no gallop.   No murmur heard. Pulmonary/Chest: Effort normal and breath sounds normal. No respiratory distress. He has no wheezes. He has no rhonchi. He has no rales.  Abdominal: Soft. Normal appearance and bowel sounds are normal. There is no hepatosplenomegaly. There is no tenderness. There is no rebound, no guarding and no CVA tenderness. No hernia.  Neurological: He is alert. He has normal reflexes.  Skin: Skin is warm, dry and intact. No rash noted.  Psychiatric: He has a normal mood and affect. His speech is normal and behavior is normal. Judgment normal.          Assessment & Plan:

## 2014-12-24 NOTE — Assessment & Plan Note (Signed)
Pt with extensive smoking history but no diagnosed  COPD.  No sign of CHF exac. Pt reports he is not feeling bad.. Cough not getting some better off and on.  Recommend more time to improve.. if not improving in 3-5 days.. Complete antibiotics.

## 2014-12-24 NOTE — Progress Notes (Signed)
Pre visit review using our clinic review tool, if applicable. No additional management support is needed unless otherwise documented below in the visit note. 

## 2014-12-24 NOTE — Patient Instructions (Signed)
Mucinex DM twice daily for cough. Rest, fluids.  If not improving in 3-5 days or new fever, fill antibiotics. If improving continue to given it more time.  Go to ER for severe shortness of breath.

## 2015-01-03 NOTE — Telephone Encounter (Signed)
LM on patient's mobile # with recommendations. Encouraged patient to call with any questions.

## 2015-01-13 ENCOUNTER — Other Ambulatory Visit: Payer: Self-pay | Admitting: Family Medicine

## 2015-01-13 NOTE — Telephone Encounter (Signed)
Last office visit 12/24/2014.  Last refilled 12/13/2014 for #30 with no refills.  Ok to refill?

## 2015-01-14 NOTE — Telephone Encounter (Signed)
Alprazolam called in to [facility] 

## 2015-02-13 ENCOUNTER — Other Ambulatory Visit: Payer: Self-pay | Admitting: *Deleted

## 2015-02-13 ENCOUNTER — Other Ambulatory Visit: Payer: Self-pay | Admitting: Family Medicine

## 2015-02-13 NOTE — Telephone Encounter (Signed)
Last office visit 12/24/2014.  Last refilled 01/14/2015 for #30 with no refills.  Ok to refill?

## 2015-02-14 MED ORDER — ALPRAZOLAM 1 MG PO TABS: 1.0000 mg | ORAL_TABLET | Freq: Every day | ORAL | Status: DC

## 2015-02-14 NOTE — Telephone Encounter (Signed)
Alprazolam called in to [facility] 

## 2015-02-17 ENCOUNTER — Ambulatory Visit (INDEPENDENT_AMBULATORY_CARE_PROVIDER_SITE_OTHER): Payer: Medicare HMO | Admitting: *Deleted

## 2015-02-17 DIAGNOSIS — I255 Ischemic cardiomyopathy: Secondary | ICD-10-CM

## 2015-02-17 DIAGNOSIS — I5022 Chronic systolic (congestive) heart failure: Secondary | ICD-10-CM | POA: Diagnosis not present

## 2015-02-18 NOTE — Progress Notes (Signed)
Remote ICD transmission.   

## 2015-02-19 ENCOUNTER — Telehealth: Payer: Self-pay | Admitting: *Deleted

## 2015-02-19 LAB — CUP PACEART REMOTE DEVICE CHECK
Battery Remaining Longevity: 129 mo
Brady Statistic RV Percent Paced: 0.35 %
HighPow Impedance: 50 Ohm
Implantable Lead Location: 753860
Lead Channel Impedance Value: 399 Ohm
Lead Channel Pacing Threshold Amplitude: 0.625 V
Lead Channel Setting Pacing Pulse Width: 0.4 ms
Lead Channel Setting Sensing Sensitivity: 0.3 mV
MDC IDC LEAD IMPLANT DT: 20150709
MDC IDC MSMT BATTERY VOLTAGE: 3.01 V
MDC IDC MSMT LEADCHNL RV IMPEDANCE VALUE: 304 Ohm
MDC IDC MSMT LEADCHNL RV PACING THRESHOLD PULSEWIDTH: 0.4 ms
MDC IDC MSMT LEADCHNL RV SENSING INTR AMPL: 7.625 mV
MDC IDC MSMT LEADCHNL RV SENSING INTR AMPL: 7.625 mV
MDC IDC SESS DTM: 20170123093826
MDC IDC SET LEADCHNL RV PACING AMPLITUDE: 2.5 V

## 2015-02-19 NOTE — Telephone Encounter (Signed)
LMOM to return call to device clinic regarding transmission sent 02/17/15.

## 2015-02-19 NOTE — Telephone Encounter (Signed)
Pt returning call. Fluid reading steadily increasing since October. Pt's lasix/potassium is being managed by Dr. Ermalene Searing. He checks is weight daily and adjusts according to direction from her. He feels as if he is making slow-but-steady progress with his breathing and weight. I will route to Dr. Ladona Ridgel, if he has further recommendations I will call the pt back. He is agreeable with plan.

## 2015-02-26 ENCOUNTER — Encounter: Payer: Self-pay | Admitting: Cardiology

## 2015-03-04 ENCOUNTER — Telehealth: Payer: Self-pay

## 2015-03-04 NOTE — Telephone Encounter (Signed)
Pt left v/m requesting cb with name of vitamin or supplement that pt can take to build his blood that would be OK to take with his other meds. Pt request cb.

## 2015-03-06 NOTE — Telephone Encounter (Signed)
Pt left v/m requesting cb about taking vitamin or supplement.

## 2015-03-06 NOTE — Telephone Encounter (Signed)
What does he mean build his blood. If he is iron def.. Iron can help, but we would need to eval cbc and iron panel first. Let me know if pt interested.. Or does he mean Vit C  500 mg qd for immune system.

## 2015-03-06 NOTE — Telephone Encounter (Signed)
He needs to increase protein in diet and increase toning exercises with weights.

## 2015-03-06 NOTE — Telephone Encounter (Signed)
[name] Roberts notified as instructed by telephone.

## 2015-03-06 NOTE — Telephone Encounter (Signed)
Spoke with [name].  He states his main concern is muscle loss.  He states every since he had his heart surgery 10 years ago he has had some muscle loss and now he is noticing muscle loss in the muscles he uses.  Please advise.

## 2015-03-12 ENCOUNTER — Encounter: Payer: Self-pay | Admitting: Cardiology

## 2015-03-14 ENCOUNTER — Other Ambulatory Visit: Payer: Self-pay | Admitting: *Deleted

## 2015-03-14 MED ORDER — ALPRAZOLAM 1 MG PO TABS: 1.0000 mg | ORAL_TABLET | Freq: Every day | ORAL | Status: DC

## 2015-03-14 NOTE — Telephone Encounter (Signed)
Last office visit 12/24/2014.  Last refilled 02/14/2015 for #30 with no refills.  Ok to refill?

## 2015-03-17 NOTE — Telephone Encounter (Signed)
Alprazolam called in to [facility] 

## 2015-04-10 ENCOUNTER — Other Ambulatory Visit: Payer: Self-pay | Admitting: *Deleted

## 2015-04-10 MED ORDER — CARVEDILOL 6.25 MG PO TABS: 6.2500 mg | ORAL_TABLET | Freq: Two times a day (BID) | ORAL | Status: DC

## 2015-04-15 ENCOUNTER — Other Ambulatory Visit: Payer: Self-pay | Admitting: *Deleted

## 2015-04-15 MED ORDER — ALPRAZOLAM 1 MG PO TABS: 1.0000 mg | ORAL_TABLET | Freq: Every day | ORAL | Status: DC

## 2015-04-15 NOTE — Telephone Encounter (Addendum)
Alprazolam called into [facility].  Mr. Vary notified. 

## 2015-04-15 NOTE — Telephone Encounter (Signed)
Last office visit 12/24/2014. Last refilled 03/14/2015 for #30 with no refills.  Ok to refill?

## 2015-04-15 NOTE — Telephone Encounter (Signed)
Pt left v/m requesting cb with status of alprazolam.

## 2015-05-14 ENCOUNTER — Other Ambulatory Visit: Payer: Self-pay | Admitting: Family Medicine

## 2015-05-14 NOTE — Telephone Encounter (Signed)
Last office visit 12/24/2014 for cough.  Last refilled 04/15/2015 for #30 with no refills.  Ok to refill?

## 2015-05-15 NOTE — Telephone Encounter (Signed)
Alprazolam called into [facility].

## 2015-05-19 ENCOUNTER — Ambulatory Visit (INDEPENDENT_AMBULATORY_CARE_PROVIDER_SITE_OTHER): Payer: Medicare HMO | Admitting: *Deleted

## 2015-05-19 DIAGNOSIS — I255 Ischemic cardiomyopathy: Secondary | ICD-10-CM

## 2015-05-19 DIAGNOSIS — I5022 Chronic systolic (congestive) heart failure: Secondary | ICD-10-CM | POA: Diagnosis not present

## 2015-05-19 NOTE — Progress Notes (Signed)
Remote ICD transmission.   

## 2015-05-26 ENCOUNTER — Other Ambulatory Visit: Payer: Self-pay | Admitting: Family Medicine

## 2015-06-10 ENCOUNTER — Other Ambulatory Visit: Payer: Self-pay | Admitting: Family Medicine

## 2015-06-10 ENCOUNTER — Telehealth: Payer: Self-pay | Admitting: Family Medicine

## 2015-06-10 DIAGNOSIS — E785 Hyperlipidemia, unspecified: Secondary | ICD-10-CM

## 2015-06-10 NOTE — Telephone Encounter (Signed)
-----   Message from Alvina Chou sent at 06/03/2015  4:33 PM EDT ----- Regarding: Lab orders for Wednesday, 5.17.17 Patient is scheduled for CPX labs, please order future labs, Thanks , Camelia Eng

## 2015-06-11 ENCOUNTER — Other Ambulatory Visit (INDEPENDENT_AMBULATORY_CARE_PROVIDER_SITE_OTHER): Payer: Medicare HMO

## 2015-06-11 DIAGNOSIS — E785 Hyperlipidemia, unspecified: Secondary | ICD-10-CM | POA: Diagnosis not present

## 2015-06-11 LAB — COMPREHENSIVE METABOLIC PANEL
ALBUMIN: 3.9 g/dL (ref 3.5–5.2)
ALK PHOS: 111 U/L (ref 39–117)
ALT: 39 U/L (ref 0–53)
AST: 48 U/L — ABNORMAL HIGH (ref 0–37)
BUN: 61 mg/dL — ABNORMAL HIGH (ref 6–23)
CALCIUM: 9.3 mg/dL (ref 8.4–10.5)
CO2: 25 mEq/L (ref 19–32)
Chloride: 105 mEq/L (ref 96–112)
Creatinine, Ser: 1.84 mg/dL — ABNORMAL HIGH (ref 0.40–1.50)
GFR: 38.36 mL/min — AB (ref >60.00)
Glucose, Bld: 87 mg/dL (ref 70–99)
POTASSIUM: 4.6 meq/L (ref 3.5–5.1)
Sodium: 138 mEq/L (ref 135–145)
TOTAL PROTEIN: 6.8 g/dL (ref 6.0–8.3)
Total Bilirubin: 1.9 mg/dL — ABNORMAL HIGH (ref 0.2–1.2)

## 2015-06-11 LAB — LIPID PANEL
CHOLESTEROL: 64 mg/dL (ref 0–200)
HDL: 20.5 mg/dL — AB (ref >39.00)
LDL Cholesterol: 31 mg/dL (ref 0–99)
NONHDL: 43.92
TRIGLYCERIDES: 66 mg/dL (ref 0.0–149.0)
Total CHOL/HDL Ratio: 3
VLDL: 13.2 mg/dL (ref 0.0–40.0)

## 2015-06-12 ENCOUNTER — Other Ambulatory Visit: Payer: Self-pay | Admitting: Family Medicine

## 2015-06-12 NOTE — Telephone Encounter (Signed)
Last office visit 12/24/2014.  Last refilled 05/15/2015 for #30 with no refills. Ok to refill?

## 2015-06-12 NOTE — Telephone Encounter (Signed)
Alprazolam called in to [facility] 

## 2015-06-20 ENCOUNTER — Emergency Department (HOSPITAL_COMMUNITY): Payer: No Typology Code available for payment source

## 2015-06-20 ENCOUNTER — Encounter (HOSPITAL_COMMUNITY): Payer: Self-pay

## 2015-06-20 ENCOUNTER — Inpatient Hospital Stay (HOSPITAL_COMMUNITY)
Admission: EM | Admit: 2015-06-20 | Discharge: 2015-06-30 | DRG: 287 | Disposition: A | Discharge status: Home Health | Payer: No Typology Code available for payment source | Attending: Internal Medicine | Admitting: Internal Medicine

## 2015-06-20 ENCOUNTER — Ambulatory Visit (INDEPENDENT_AMBULATORY_CARE_PROVIDER_SITE_OTHER): Payer: Medicare HMO | Admitting: Family Medicine

## 2015-06-20 ENCOUNTER — Encounter: Payer: Self-pay | Admitting: Family Medicine

## 2015-06-20 ENCOUNTER — Encounter: Payer: Medicare HMO | Admitting: Cardiovascular Disease

## 2015-06-20 VITALS — BP 98/58 | HR 73 | Temp 97.4°F | Ht 68.0 in | Wt 180.5 lb

## 2015-06-20 DIAGNOSIS — R740 Nonspecific elevation of levels of transaminase and lactic acid dehydrogenase [LDH]: Secondary | ICD-10-CM | POA: Diagnosis not present

## 2015-06-20 DIAGNOSIS — Z881 Allergy status to other antibiotic agents status: Secondary | ICD-10-CM

## 2015-06-20 DIAGNOSIS — I081 Rheumatic disorders of both mitral and tricuspid valves: Secondary | ICD-10-CM | POA: Diagnosis present

## 2015-06-20 DIAGNOSIS — R188 Other ascites: Secondary | ICD-10-CM | POA: Diagnosis present

## 2015-06-20 DIAGNOSIS — Z7982 Long term (current) use of aspirin: Secondary | ICD-10-CM

## 2015-06-20 DIAGNOSIS — D638 Anemia in other chronic diseases classified elsewhere: Secondary | ICD-10-CM | POA: Diagnosis present

## 2015-06-20 DIAGNOSIS — I255 Ischemic cardiomyopathy: Secondary | ICD-10-CM | POA: Diagnosis present

## 2015-06-20 DIAGNOSIS — R55 Syncope and collapse: Secondary | ICD-10-CM | POA: Diagnosis present

## 2015-06-20 DIAGNOSIS — N183 Chronic kidney disease, stage 3 unspecified: Secondary | ICD-10-CM

## 2015-06-20 DIAGNOSIS — I493 Ventricular premature depolarization: Secondary | ICD-10-CM | POA: Diagnosis present

## 2015-06-20 DIAGNOSIS — I472 Ventricular tachycardia: Secondary | ICD-10-CM | POA: Diagnosis present

## 2015-06-20 DIAGNOSIS — I2510 Atherosclerotic heart disease of native coronary artery without angina pectoris: Secondary | ICD-10-CM | POA: Diagnosis present

## 2015-06-20 DIAGNOSIS — Z7901 Long term (current) use of anticoagulants: Secondary | ICD-10-CM | POA: Diagnosis not present

## 2015-06-20 DIAGNOSIS — E785 Hyperlipidemia, unspecified: Secondary | ICD-10-CM | POA: Diagnosis present

## 2015-06-20 DIAGNOSIS — E039 Hypothyroidism, unspecified: Secondary | ICD-10-CM | POA: Diagnosis present

## 2015-06-20 DIAGNOSIS — R609 Edema, unspecified: Secondary | ICD-10-CM | POA: Diagnosis not present

## 2015-06-20 DIAGNOSIS — Z8249 Family history of ischemic heart disease and other diseases of the circulatory system: Secondary | ICD-10-CM | POA: Diagnosis not present

## 2015-06-20 DIAGNOSIS — I252 Old myocardial infarction: Secondary | ICD-10-CM

## 2015-06-20 DIAGNOSIS — R7989 Other specified abnormal findings of blood chemistry: Secondary | ICD-10-CM | POA: Diagnosis present

## 2015-06-20 DIAGNOSIS — Z0000 Encounter for general adult medical examination without abnormal findings: Secondary | ICD-10-CM

## 2015-06-20 DIAGNOSIS — D649 Anemia, unspecified: Secondary | ICD-10-CM | POA: Diagnosis not present

## 2015-06-20 DIAGNOSIS — Z87891 Personal history of nicotine dependence: Secondary | ICD-10-CM

## 2015-06-20 DIAGNOSIS — Z951 Presence of aortocoronary bypass graft: Secondary | ICD-10-CM | POA: Diagnosis not present

## 2015-06-20 DIAGNOSIS — M109 Gout, unspecified: Secondary | ICD-10-CM | POA: Diagnosis present

## 2015-06-20 DIAGNOSIS — I519 Heart disease, unspecified: Secondary | ICD-10-CM | POA: Insufficient documentation

## 2015-06-20 DIAGNOSIS — I4729 Other ventricular tachycardia: Secondary | ICD-10-CM | POA: Insufficient documentation

## 2015-06-20 DIAGNOSIS — E038 Other specified hypothyroidism: Secondary | ICD-10-CM | POA: Diagnosis not present

## 2015-06-20 DIAGNOSIS — Z95810 Presence of automatic (implantable) cardiac defibrillator: Secondary | ICD-10-CM

## 2015-06-20 DIAGNOSIS — Z79899 Other long term (current) drug therapy: Secondary | ICD-10-CM

## 2015-06-20 DIAGNOSIS — I509 Heart failure, unspecified: Secondary | ICD-10-CM | POA: Diagnosis not present

## 2015-06-20 DIAGNOSIS — M1009 Idiopathic gout, multiple sites: Secondary | ICD-10-CM

## 2015-06-20 DIAGNOSIS — R0609 Other forms of dyspnea: Secondary | ICD-10-CM | POA: Diagnosis present

## 2015-06-20 DIAGNOSIS — I272 Other secondary pulmonary hypertension: Secondary | ICD-10-CM | POA: Diagnosis present

## 2015-06-20 DIAGNOSIS — I130 Hypertensive heart and chronic kidney disease with heart failure and stage 1 through stage 4 chronic kidney disease, or unspecified chronic kidney disease: Secondary | ICD-10-CM | POA: Diagnosis present

## 2015-06-20 DIAGNOSIS — I5022 Chronic systolic (congestive) heart failure: Secondary | ICD-10-CM | POA: Diagnosis not present

## 2015-06-20 DIAGNOSIS — I959 Hypotension, unspecified: Secondary | ICD-10-CM | POA: Diagnosis present

## 2015-06-20 DIAGNOSIS — R7401 Elevation of levels of liver transaminase levels: Secondary | ICD-10-CM | POA: Diagnosis present

## 2015-06-20 DIAGNOSIS — I4581 Long QT syndrome: Secondary | ICD-10-CM | POA: Diagnosis present

## 2015-06-20 DIAGNOSIS — I5023 Acute on chronic systolic (congestive) heart failure: Principal | ICD-10-CM | POA: Diagnosis present

## 2015-06-20 DIAGNOSIS — Z8673 Personal history of transient ischemic attack (TIA), and cerebral infarction without residual deficits: Secondary | ICD-10-CM | POA: Diagnosis not present

## 2015-06-20 DIAGNOSIS — K7460 Unspecified cirrhosis of liver: Secondary | ICD-10-CM | POA: Diagnosis present

## 2015-06-20 DIAGNOSIS — I25812 Atherosclerosis of bypass graft of coronary artery of transplanted heart without angina pectoris: Secondary | ICD-10-CM | POA: Insufficient documentation

## 2015-06-20 LAB — CBC
HCT: 36.4 % — ABNORMAL LOW (ref 39.0–52.0)
Hemoglobin: 11.5 g/dL — ABNORMAL LOW (ref 13.0–17.0)
MCH: 30.9 pg (ref 26.0–34.0)
MCHC: 31.6 g/dL (ref 30.0–36.0)
MCV: 97.8 fL (ref 78.0–100.0)
PLATELETS: 124 10*3/uL — AB (ref 150–400)
RBC: 3.72 MIL/uL — AB (ref 4.22–5.81)
RDW: 16 % — ABNORMAL HIGH (ref 11.5–15.5)
WBC: 6 10*3/uL (ref 4.0–10.5)

## 2015-06-20 LAB — COMPREHENSIVE METABOLIC PANEL
ALBUMIN: 3.6 g/dL (ref 3.5–5.0)
ALT: 41 U/L (ref 17–63)
ANION GAP: 6 (ref 5–15)
AST: 52 U/L — ABNORMAL HIGH (ref 15–41)
Alkaline Phosphatase: 110 U/L (ref 38–126)
BILIRUBIN TOTAL: 1.7 mg/dL — AB (ref 0.3–1.2)
BUN: 50 mg/dL — ABNORMAL HIGH (ref 6–20)
CO2: 24 mmol/L (ref 22–32)
Calcium: 9.2 mg/dL (ref 8.9–10.3)
Chloride: 106 mmol/L (ref 101–111)
Creatinine, Ser: 1.62 mg/dL — ABNORMAL HIGH (ref 0.61–1.24)
GFR calc Af Amer: 47 mL/min — ABNORMAL LOW (ref >60)
GFR, EST NON AFRICAN AMERICAN: 40 mL/min — AB (ref >60)
GLUCOSE: 115 mg/dL — AB (ref 65–99)
POTASSIUM: 4.9 mmol/L (ref 3.5–5.1)
Sodium: 136 mmol/L (ref 135–145)
TOTAL PROTEIN: 6.7 g/dL (ref 6.5–8.1)

## 2015-06-20 LAB — LIPASE, BLOOD: LIPASE: 32 U/L (ref 11–51)

## 2015-06-20 LAB — T4, FREE: FREE T4: 0.76 ng/dL (ref 0.60–1.60)

## 2015-06-20 LAB — TSH: TSH: 12.6 u[IU]/mL — ABNORMAL HIGH (ref 0.35–4.50)

## 2015-06-20 LAB — MAGNESIUM: MAGNESIUM: 2.6 mg/dL — AB (ref 1.7–2.4)

## 2015-06-20 LAB — I-STAT TROPONIN, ED: TROPONIN I, POC: 0.02 ng/mL (ref 0.00–0.08)

## 2015-06-20 LAB — T3, FREE: T3 FREE: 2.8 pg/mL (ref 2.3–4.2)

## 2015-06-20 LAB — BRAIN NATRIURETIC PEPTIDE
B Natriuretic Peptide: 2262.1 pg/mL — ABNORMAL HIGH (ref 0.0–100.0)
Pro B Natriuretic peptide (BNP): 2636 pg/mL — ABNORMAL HIGH (ref 0.0–100.0)

## 2015-06-20 MED ORDER — SIMVASTATIN 40 MG PO TABS
40.0000 mg | ORAL_TABLET | Freq: Every evening | ORAL | Status: DC
  Administered 2015-06-21 – 2015-06-24 (×4): 40 mg via ORAL
  Filled 2015-06-20 (×4): qty 1

## 2015-06-20 MED ORDER — ASPIRIN EC 81 MG PO TBEC
81.0000 mg | DELAYED_RELEASE_TABLET | Freq: Every day | ORAL | Status: DC
  Administered 2015-06-21 – 2015-06-29 (×9): 81 mg via ORAL
  Filled 2015-06-20 (×10): qty 1

## 2015-06-20 MED ORDER — LOSARTAN POTASSIUM 50 MG PO TABS: 50.0000 mg | ORAL_TABLET | Freq: Every day | ORAL | Status: DC

## 2015-06-20 MED ORDER — FUROSEMIDE 10 MG/ML IJ SOLN
60.0000 mg | Freq: Two times a day (BID) | INTRAMUSCULAR | Status: DC
  Filled 2015-06-20: qty 6

## 2015-06-20 MED ORDER — SODIUM CHLORIDE 0.9% FLUSH
3.0000 mL | Freq: Two times a day (BID) | INTRAVENOUS | Status: DC
  Administered 2015-06-21 – 2015-06-29 (×10): 3 mL via INTRAVENOUS

## 2015-06-20 MED ORDER — ENOXAPARIN SODIUM 40 MG/0.4ML ~~LOC~~ SOLN: 40.0000 mg | Freq: Every day | SUBCUTANEOUS | Status: DC

## 2015-06-20 MED ORDER — ACETAMINOPHEN 325 MG PO TABS: 650.0000 mg | ORAL_TABLET | Freq: Four times a day (QID) | ORAL | Status: DC | PRN

## 2015-06-20 MED ORDER — ALLOPURINOL 100 MG PO TABS
100.0000 mg | ORAL_TABLET | Freq: Every evening | ORAL | Status: DC
  Administered 2015-06-21 – 2015-06-29 (×9): 100 mg via ORAL
  Filled 2015-06-20 (×9): qty 1

## 2015-06-20 MED ORDER — ALPRAZOLAM 0.5 MG PO TABS
1.0000 mg | ORAL_TABLET | Freq: Every day | ORAL | Status: DC
  Administered 2015-06-21: 1 mg via ORAL
  Filled 2015-06-20: qty 2

## 2015-06-20 MED ORDER — FUROSEMIDE 10 MG/ML IJ SOLN
40.0000 mg | Freq: Once | INTRAMUSCULAR | Status: AC
Start: 2015-06-20 — End: 2015-06-20
  Administered 2015-06-20: 40 mg via INTRAVENOUS
  Filled 2015-06-20: qty 4

## 2015-06-20 MED ORDER — IOPAMIDOL (ISOVUE-300) INJECTION 61%
80.0000 mL | Freq: Once | INTRAVENOUS | Status: AC | PRN
  Administered 2015-06-20: 80 mL via INTRAVENOUS

## 2015-06-20 MED ORDER — POTASSIUM CHLORIDE CRYS ER 20 MEQ PO TBCR
40.0000 meq | EXTENDED_RELEASE_TABLET | Freq: Every day | ORAL | Status: DC
  Administered 2015-06-21 – 2015-06-25 (×5): 40 meq via ORAL
  Filled 2015-06-20 (×5): qty 2

## 2015-06-20 MED ORDER — CARVEDILOL 6.25 MG PO TABS
6.2500 mg | ORAL_TABLET | Freq: Two times a day (BID) | ORAL | Status: DC
  Administered 2015-06-21 – 2015-06-23 (×6): 6.25 mg via ORAL
  Filled 2015-06-20 (×6): qty 1

## 2015-06-20 MED ORDER — ACETAMINOPHEN 650 MG RE SUPP: 650.0000 mg | Freq: Four times a day (QID) | RECTAL | Status: DC | PRN
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
## 2015-06-20 NOTE — Progress Notes (Signed)
Pre visit review using our clinic review tool, if applicable. No additional management support is needed unless otherwise documented below in the visit note. 

## 2015-06-20 NOTE — Progress Notes (Signed)
The patient is here for annual wellness exam and preventative care.  I have personally reviewed the Medicare Annual Wellness questionnaire and have noted 1. The patient's medical and social history 2. Their use of alcohol, tobacco or illicit drugs 3. Their current medications and supplements 4. The patient's functional ability including ADL's, fall risks, home safety risks and hearing or visual             impairment. 5. Diet and physical activities 6. Evidence for depression or mood disorders 7.         Updated provider list Cognitive evaluation was performed and recorded on pt medicare questionnaire form. The patients weight, height, BMI and visual acuity have been recorded in the chart  I have made referrals, counseling and provided education to the patient based review of the above and I have provided the pt with a written personalized care plan for preventive services.   CHF, systolic and ischemic cardiomyopahty He has been very tired lately x 3 weeks. Falls asleep when sitting at times.  Dyspne on exertion, none at rest.  Swelling in legs and abdomen bilaterally since 09/2014.Marland Kitchen See OV for that issue. BNP 2451, Cr 1.38, was TSH slight high at 5.  Feeling very cold, all the time. Swelling worse lately in last week has gained 80 lbs.  He has had a lot of weight in stomach area.   He has been taking lasix 80 mg daily, minimal urination in response.  He has appoitment with cardiology this afternoon.  Currently being treated For shingles in left ey by eye MD.   Wt Readings from Last 3 Encounters:  06/20/15 180 lb 8 oz (81.874 kg)  12/24/14 173 lb 12 oz (78.812 kg)  12/17/14 171 lb 8 oz (77.792 kg)   Kidney function is slightly worse than previous Cr. 1.9   Elevated Cholesterol: Well controlled on simvastatin Goal < 70. Lab Results  Component Value Date   CHOL 64 06/11/2015   HDL 20.50* 06/11/2015   LDLCALC 31 06/11/2015   TRIG 66.0 06/11/2015   CHOLHDL 3 06/11/2015  Using  medications without problems: rare fluishing Muscle aches: None Diet compliance:working low chol diet. Exercise: walking Other complaints:   CAD, CHF: Seen cards [facility] 08/2013.stable on current meds  History of ICM s/p ICD, CAD s/p CABG Followed by Dr. Ladona Ridgel 10/2013  Prediabetes: improved.  Doing bicycle at [facility] three times a week.  Diet: moderate.  BP Readings from Last 3 Encounters:  06/20/15 98/58  12/24/14 104/64  12/17/14 126/72    Anxiety/insomnia, well controlled..using Alprazolam 1 mg nightly for insomnia.  Main issue is that coreg "jacks him up".  No daytime anxiety, no depression.  No SI.   Gout: No flares in last year.  on low uric acid diet and low dose allopurinol.  Review of Systems  Constitutional: Negative for fever, fatigue and unexpected weight change.  HENT: Negative for ear pain, congestion, sore throat rhinorrhea, trouble swallowing and postnasal drip.  Eyes: Negative for pain.  Respiratory: Negative for cough, stable shortness of breath and no wheezing.  Cardiovascular: Negative for chest pain, palpitations and leg swelling.  Gastrointestinal: Negative for nausea, abdominal pain, diarrhea, constipation and blood in stool.  Genitourinary: Negative for dysuria, urgency, hematuria, discharge, penile swelling, scrotal swelling, difficulty urinating, penile pain and testicular pain.  Skin: Negative for rash.  Neurological: Negative for syncope, weakness, light-headedness, numbness and headaches.  Psychiatric/Behavioral: Negative for behavioral problems and dysphoric mood. The patient is not nervous/anxious.  Objective:  Physical Exam  Constitutional: He appears well-developed and well-nourished. Non-toxic appearance. He does not appear ill. No distress.  HENT:  Head: Normocephalic and atraumatic.  Right Ear: Hearing, tympanic membrane, external ear and ear canal normal.  Left Ear: Hearing, tympanic membrane, external ear and ear  canal normal.  Nose: Nose normal.  Mouth/Throat: Uvula is midline, oropharynx is clear and moist and mucous membranes are normal.  Eyes: Conjunctivae, EOM and lids are normal. Pupils are equal, round, and reactive to light. No foreign bodies found.  Neck: Trachea normal, normal range of motion and phonation normal. Neck supple. Carotid bruit is not present. No mass and no thyromegaly present.  Cardiovascular: Normal rate, regular rhythm, S1 normal, S2 normal, intact distal pulses and normal pulses. Exam reveals no gallop.  No murmur heard.  Pulmonary/Chest: Breath sounds normal. He has no wheezes. He has no rhonchi. He has no rales.  Abdominal: Soft. Normal appearance and bowel sounds are normal. There is no hepatosplenomegaly. There is no tenderness. There is no rebound, no guarding and no CVA tenderness. No hernia. Hernia confirmed negative in the right inguinal area and confirmed negative in the left inguinal area.  Genitourinary:Not examined. Lymphadenopathy:  He has no cervical adenopathy.  Right: No inguinal adenopathy present.  Left: No inguinal adenopathy present.  Neurological: He is alert. He has normal strength and normal reflexes. No cranial nerve deficit or sensory deficit. Gait normal.  Skin: Skin is warm, dry and intact. No rash noted.  Psychiatric: He has a normal mood and affect. His speech is normal and behavior is normal. Judgment normal.  Assessment & Plan:   Complete Physical Exam  The patient's preventative maintenance and recommended screening tests for an annual wellness exam were reviewed in full today.  Brought up to date unless services declined.  Counselled on the importance of diet, exercise, and its role in overall health and mortality.  The patient's FH and SH was reviewed, including their home life, tobacco status, and drug and alcohol status.   Vaccines: PNA, Td uptodate, Shingles: not interested Colon: 07/20/2012 Dr. Kinnie Scales, repeat in 5  years. Prostate: not indicated, pt agreeable.  Nonsmoker Body mass index is 27.45 kg/(m^2). Hep C: neg

## 2015-06-20 NOTE — Progress Notes (Signed)
Cancel.

## 2015-06-20 NOTE — Patient Instructions (Addendum)
Stop at lab on way out.  Keep apppointment with cardiology today.

## 2015-06-20 NOTE — ED Notes (Signed)
Patient transported to CT 

## 2015-06-20 NOTE — ED Notes (Signed)
[employer] ICD interrogated at this time.
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
## 2015-06-21 ENCOUNTER — Inpatient Hospital Stay (HOSPITAL_COMMUNITY): Payer: No Typology Code available for payment source

## 2015-06-21 DIAGNOSIS — E785 Hyperlipidemia, unspecified: Secondary | ICD-10-CM

## 2015-06-21 DIAGNOSIS — R55 Syncope and collapse: Secondary | ICD-10-CM

## 2015-06-21 DIAGNOSIS — G4719 Other hypersomnia: Secondary | ICD-10-CM

## 2015-06-21 DIAGNOSIS — I509 Heart failure, unspecified: Secondary | ICD-10-CM

## 2015-06-21 DIAGNOSIS — I429 Cardiomyopathy, unspecified: Secondary | ICD-10-CM

## 2015-06-21 DIAGNOSIS — I25768 Atherosclerosis of bypass graft of coronary artery of transplanted heart with other forms of angina pectoris: Secondary | ICD-10-CM

## 2015-06-21 DIAGNOSIS — I519 Heart disease, unspecified: Secondary | ICD-10-CM

## 2015-06-21 DIAGNOSIS — Z95810 Presence of automatic (implantable) cardiac defibrillator: Secondary | ICD-10-CM

## 2015-06-21 DIAGNOSIS — D649 Anemia, unspecified: Secondary | ICD-10-CM

## 2015-06-21 DIAGNOSIS — I5023 Acute on chronic systolic (congestive) heart failure: Principal | ICD-10-CM

## 2015-06-21 DIAGNOSIS — N183 Chronic kidney disease, stage 3 (moderate): Secondary | ICD-10-CM

## 2015-06-21 DIAGNOSIS — E038 Other specified hypothyroidism: Secondary | ICD-10-CM

## 2015-06-21 LAB — BASIC METABOLIC PANEL
Anion gap: 10 (ref 5–15)
BUN: 46 mg/dL — AB (ref 6–20)
CHLORIDE: 106 mmol/L (ref 101–111)
CO2: 22 mmol/L (ref 22–32)
CREATININE: 1.5 mg/dL — AB (ref 0.61–1.24)
Calcium: 9.1 mg/dL (ref 8.9–10.3)
GFR calc Af Amer: 51 mL/min — ABNORMAL LOW (ref >60)
GFR calc non Af Amer: 44 mL/min — ABNORMAL LOW (ref >60)
GLUCOSE: 80 mg/dL (ref 65–99)
Potassium: 4.3 mmol/L (ref 3.5–5.1)
Sodium: 138 mmol/L (ref 135–145)

## 2015-06-21 LAB — TROPONIN I
TROPONIN I: 0.03 ng/mL (ref <0.031)
TROPONIN I: 0.03 ng/mL (ref <0.031)
Troponin I: 0.04 ng/mL — ABNORMAL HIGH (ref <0.031)

## 2015-06-21 LAB — CBC
HCT: 37.6 % — ABNORMAL LOW (ref 39.0–52.0)
HEMOGLOBIN: 11.7 g/dL — AB (ref 13.0–17.0)
MCH: 30.3 pg (ref 26.0–34.0)
MCHC: 31.1 g/dL (ref 30.0–36.0)
MCV: 97.4 fL (ref 78.0–100.0)
Platelets: 130 10*3/uL — ABNORMAL LOW (ref 150–400)
RBC: 3.86 MIL/uL — AB (ref 4.22–5.81)
RDW: 16.1 % — ABNORMAL HIGH (ref 11.5–15.5)
WBC: 6.8 10*3/uL (ref 4.0–10.5)

## 2015-06-21 LAB — IRON AND TIBC
IRON: 83 ug/dL (ref 45–182)
SATURATION RATIOS: 21 % (ref 17.9–39.5)
TIBC: 399 ug/dL (ref 250–450)
UIBC: 316 ug/dL

## 2015-06-21 LAB — RETICULOCYTES
RBC.: 3.86 MIL/uL — ABNORMAL LOW (ref 4.22–5.81)
Retic Count, Absolute: 38.6 10*3/uL (ref 19.0–186.0)
Retic Ct Pct: 1 % (ref 0.4–3.1)

## 2015-06-21 LAB — ECHOCARDIOGRAM COMPLETE
Height: 68 in
Weight: 2812.8 oz

## 2015-06-21 LAB — CREATININE, SERUM
Creatinine, Ser: 1.54 mg/dL — ABNORMAL HIGH (ref 0.61–1.24)
GFR calc non Af Amer: 43 mL/min — ABNORMAL LOW (ref >60)
GFR, EST AFRICAN AMERICAN: 50 mL/min — AB (ref >60)

## 2015-06-21 LAB — FERRITIN: FERRITIN: 147 ng/mL (ref 24–336)

## 2015-06-21 LAB — VITAMIN B12: VITAMIN B 12: 440 pg/mL (ref 180–914)

## 2015-06-21 MED ORDER — FUROSEMIDE 10 MG/ML IJ SOLN
60.0000 mg | Freq: Two times a day (BID) | INTRAMUSCULAR | Status: DC
  Administered 2015-06-21 – 2015-06-23 (×5): 60 mg via INTRAVENOUS
  Filled 2015-06-21 (×5): qty 6

## 2015-06-21 MED ORDER — ENOXAPARIN SODIUM 40 MG/0.4ML ~~LOC~~ SOLN
40.0000 mg | SUBCUTANEOUS | Status: DC
Start: 2015-06-21 — End: 2015-06-26
  Administered 2015-06-21 – 2015-06-25 (×5): 40 mg via SUBCUTANEOUS
  Filled 2015-06-21 (×6): qty 0.4

## 2015-06-21 MED ORDER — FUROSEMIDE 10 MG/ML IJ SOLN
60.0000 mg | Freq: Three times a day (TID) | INTRAMUSCULAR | Status: DC
  Administered 2015-06-21: 60 mg via INTRAVENOUS

## 2015-06-21 MED ORDER — PERFLUTREN LIPID MICROSPHERE
INTRAVENOUS | Status: AC
  Administered 2015-06-21: 2 mL
  Filled 2015-06-21: qty 10

## 2015-06-21 MED ORDER — FUROSEMIDE 10 MG/ML IJ SOLN
60.0000 mg | Freq: Two times a day (BID) | INTRAMUSCULAR | Status: DC
Start: 2015-06-21 — End: 2015-06-21
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
## 2015-06-21 NOTE — Progress Notes (Signed)
ANTICOAGULATION CONSULT NOTE - Initial Consult  Pharmacy Consult for Lovenox VTE prophylaxis  Indication: VTE prophylaxis  Labs:  Recent Labs  06/20/15 1658 06/21/15 0017 06/21/15 0407  HGB 11.5* 11.7*  --   HCT 36.4* 37.6*  --   PLT 124* 130*  --   CREATININE 1.62* 1.54* 1.50*    Estimated Creatinine Clearance: 41.8 mL/min (by C-G formula based on Cr of 1.5).  Assessment: 37 yoM noted to have CKD stage III. Current SCr 1.5 and CrCl ~42 mL/min. Pharmacy consulted for Lovenox VTE prophylaxis.  Plan:  Lovenox 40mg  daily Pharmacy will sign off  Remi Haggard, PharmD Clinical Pharmacist- Resident Pager: [phone]  Remi Haggard 06/21/2015,9:28 AM
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
## 2015-06-22 LAB — COMPREHENSIVE METABOLIC PANEL
ALBUMIN: 3.3 g/dL — AB (ref 3.5–5.0)
ALT: 33 U/L (ref 17–63)
ANION GAP: 9 (ref 5–15)
AST: 42 U/L — ABNORMAL HIGH (ref 15–41)
Alkaline Phosphatase: 105 U/L (ref 38–126)
BUN: 49 mg/dL — ABNORMAL HIGH (ref 6–20)
CALCIUM: 9.2 mg/dL (ref 8.9–10.3)
CO2: 25 mmol/L (ref 22–32)
Chloride: 103 mmol/L (ref 101–111)
Creatinine, Ser: 1.45 mg/dL — ABNORMAL HIGH (ref 0.61–1.24)
GFR calc non Af Amer: 46 mL/min — ABNORMAL LOW (ref >60)
GFR, EST AFRICAN AMERICAN: 53 mL/min — AB (ref >60)
GLUCOSE: 91 mg/dL (ref 65–99)
POTASSIUM: 4.1 mmol/L (ref 3.5–5.1)
SODIUM: 137 mmol/L (ref 135–145)
Total Bilirubin: 1.7 mg/dL — ABNORMAL HIGH (ref 0.3–1.2)
Total Protein: 6.6 g/dL (ref 6.5–8.1)

## 2015-06-22 LAB — CBC
HEMATOCRIT: 34 % — AB (ref 39.0–52.0)
HEMOGLOBIN: 10.9 g/dL — AB (ref 13.0–17.0)
MCH: 31.1 pg (ref 26.0–34.0)
MCHC: 32.1 g/dL (ref 30.0–36.0)
MCV: 96.9 fL (ref 78.0–100.0)
Platelets: 123 10*3/uL — ABNORMAL LOW (ref 150–400)
RBC: 3.51 MIL/uL — AB (ref 4.22–5.81)
RDW: 15.9 % — ABNORMAL HIGH (ref 11.5–15.5)
WBC: 5.8 10*3/uL (ref 4.0–10.5)

## 2015-06-22 LAB — AMMONIA: AMMONIA: 55 umol/L — AB (ref 9–35)

## 2015-06-22 MED ORDER — METOLAZONE 2.5 MG PO TABS
2.5000 mg | ORAL_TABLET | Freq: Once | ORAL | Status: AC
  Administered 2015-06-22: 2.5 mg via ORAL
  Filled 2015-06-22: qty 1
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
## 2015-06-23 DIAGNOSIS — I519 Heart disease, unspecified: Secondary | ICD-10-CM | POA: Insufficient documentation

## 2015-06-23 DIAGNOSIS — I472 Ventricular tachycardia: Secondary | ICD-10-CM | POA: Insufficient documentation

## 2015-06-23 DIAGNOSIS — I25812 Atherosclerosis of bypass graft of coronary artery of transplanted heart without angina pectoris: Secondary | ICD-10-CM | POA: Insufficient documentation

## 2015-06-23 DIAGNOSIS — I4729 Other ventricular tachycardia: Secondary | ICD-10-CM | POA: Insufficient documentation

## 2015-06-23 LAB — CUP PACEART REMOTE DEVICE CHECK
HighPow Impedance: 47 Ohm
Implantable Lead Implant Date: 20150709
Implantable Lead Location: 753860
Lead Channel Pacing Threshold Amplitude: 0.625 V
Lead Channel Pacing Threshold Pulse Width: 0.4 ms
Lead Channel Sensing Intrinsic Amplitude: 6.75 mV
Lead Channel Setting Sensing Sensitivity: 0.3 mV
MDC IDC MSMT BATTERY REMAINING LONGEVITY: 127 mo
MDC IDC MSMT BATTERY VOLTAGE: 3.01 V
MDC IDC MSMT LEADCHNL RV IMPEDANCE VALUE: 285 Ohm
MDC IDC MSMT LEADCHNL RV IMPEDANCE VALUE: 361 Ohm
MDC IDC MSMT LEADCHNL RV SENSING INTR AMPL: 6.75 mV
MDC IDC SESS DTM: 20170424041607
MDC IDC SET LEADCHNL RV PACING AMPLITUDE: 2.5 V
MDC IDC SET LEADCHNL RV PACING PULSEWIDTH: 0.4 ms
MDC IDC STAT BRADY RV PERCENT PACED: 1.19 %

## 2015-06-23 LAB — COMPREHENSIVE METABOLIC PANEL
ALK PHOS: 110 U/L (ref 38–126)
ALT: 37 U/L (ref 17–63)
AST: 48 U/L — AB (ref 15–41)
Albumin: 3.5 g/dL (ref 3.5–5.0)
Anion gap: 9 (ref 5–15)
BILIRUBIN TOTAL: 1.6 mg/dL — AB (ref 0.3–1.2)
BUN: 46 mg/dL — AB (ref 6–20)
CALCIUM: 9.3 mg/dL (ref 8.9–10.3)
CO2: 26 mmol/L (ref 22–32)
CREATININE: 1.51 mg/dL — AB (ref 0.61–1.24)
Chloride: 101 mmol/L (ref 101–111)
GFR calc Af Amer: 51 mL/min — ABNORMAL LOW (ref >60)
GFR, EST NON AFRICAN AMERICAN: 44 mL/min — AB (ref >60)
Glucose, Bld: 115 mg/dL — ABNORMAL HIGH (ref 65–99)
POTASSIUM: 3.8 mmol/L (ref 3.5–5.1)
Sodium: 136 mmol/L (ref 135–145)
TOTAL PROTEIN: 6.9 g/dL (ref 6.5–8.1)

## 2015-06-23 LAB — CBC
HCT: 35 % — ABNORMAL LOW (ref 39.0–52.0)
HEMOGLOBIN: 11.4 g/dL — AB (ref 13.0–17.0)
MCH: 31 pg (ref 26.0–34.0)
MCHC: 32.6 g/dL (ref 30.0–36.0)
MCV: 95.1 fL (ref 78.0–100.0)
PLATELETS: 132 10*3/uL — AB (ref 150–400)
RBC: 3.68 MIL/uL — AB (ref 4.22–5.81)
RDW: 15.6 % — ABNORMAL HIGH (ref 11.5–15.5)
WBC: 5.9 10*3/uL (ref 4.0–10.5)

## 2015-06-23 LAB — PROTIME-INR
INR: 1.45 (ref 0.00–1.49)
Prothrombin Time: 17.7 seconds — ABNORMAL HIGH (ref 11.6–15.2)

## 2015-06-23 LAB — AMMONIA: AMMONIA: 46 umol/L — AB (ref 9–35)

## 2015-06-23 MED ORDER — ALPRAZOLAM 0.5 MG PO TABS
1.0000 mg | ORAL_TABLET | Freq: Every evening | ORAL | Status: DC | PRN
  Administered 2015-06-23 – 2015-06-29 (×6): 1 mg via ORAL
  Filled 2015-06-23 (×7): qty 2

## 2015-06-23 MED ORDER — CARVEDILOL 6.25 MG PO TABS
6.2500 mg | ORAL_TABLET | Freq: Two times a day (BID) | ORAL | Status: DC
  Administered 2015-06-24 – 2015-06-27 (×6): 6.25 mg via ORAL
  Filled 2015-06-23 (×8): qty 1

## 2015-06-23 MED ORDER — METOLAZONE 2.5 MG PO TABS
2.5000 mg | ORAL_TABLET | Freq: Once | ORAL | Status: AC
  Administered 2015-06-23: 2.5 mg via ORAL
  Filled 2015-06-23: qty 1

## 2015-06-23 MED ORDER — FUROSEMIDE 10 MG/ML IJ SOLN
40.0000 mg | Freq: Two times a day (BID) | INTRAMUSCULAR | Status: DC
  Administered 2015-06-24: 40 mg via INTRAVENOUS
  Filled 2015-06-23: qty 4
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
## 2015-06-23 NOTE — Progress Notes (Signed)
Patient ambulated length of both hallways in department and denied dizziness or weakness.  Continues to diureses well.  Reviewed "Living Better with Heart Failure" booklet and patient stated understanding.

## 2015-06-23 NOTE — Progress Notes (Signed)
Normal remote reviewed.  Next follow up 07/2015 GT  

## 2015-06-24 DIAGNOSIS — I493 Ventricular premature depolarization: Secondary | ICD-10-CM

## 2015-06-24 DIAGNOSIS — I255 Ischemic cardiomyopathy: Secondary | ICD-10-CM

## 2015-06-24 LAB — FOLATE RBC
Folate, Hemolysate: >620 ng/mL
HEMATOCRIT: 36.2 % — AB (ref 37.5–51.0)

## 2015-06-24 LAB — COMPREHENSIVE METABOLIC PANEL
ALT: 34 U/L (ref 17–63)
ANION GAP: 10 (ref 5–15)
AST: 47 U/L — ABNORMAL HIGH (ref 15–41)
Albumin: 3.3 g/dL — ABNORMAL LOW (ref 3.5–5.0)
Alkaline Phosphatase: 101 U/L (ref 38–126)
BUN: 46 mg/dL — AB (ref 6–20)
CHLORIDE: 98 mmol/L — AB (ref 101–111)
CO2: 28 mmol/L (ref 22–32)
Calcium: 9.2 mg/dL (ref 8.9–10.3)
Creatinine, Ser: 1.46 mg/dL — ABNORMAL HIGH (ref 0.61–1.24)
GFR calc non Af Amer: 46 mL/min — ABNORMAL LOW (ref >60)
GFR, EST AFRICAN AMERICAN: 53 mL/min — AB (ref >60)
Glucose, Bld: 90 mg/dL (ref 65–99)
POTASSIUM: 3.5 mmol/L (ref 3.5–5.1)
SODIUM: 136 mmol/L (ref 135–145)
Total Bilirubin: 1.8 mg/dL — ABNORMAL HIGH (ref 0.3–1.2)
Total Protein: 6.5 g/dL (ref 6.5–8.1)

## 2015-06-24 MED ORDER — FUROSEMIDE 80 MG PO TABS
80.0000 mg | ORAL_TABLET | Freq: Two times a day (BID) | ORAL | Status: DC
  Administered 2015-06-24 – 2015-06-25 (×2): 80 mg via ORAL
  Filled 2015-06-24 (×2): qty 1

## 2015-06-24 NOTE — Care Management Important Message (Signed)
Important Message  Patient Details  Name: [name] Roberts MRN: [id number] Date of Birth: 01/16/1941   Medicare Important Message Given:  Yes    Bernadette Hoit 06/24/2015, 7:59 AM
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
## 2015-06-24 NOTE — Research (Signed)
REDS'@Discharge'  Informed Consent   Subject Name: [name] Roberts  Subject met inclusion and exclusion criteria.  The informed consent form, study requirements and expectations were reviewed with the subject and questions and concerns were addressed prior to the signing of the consent form.  The subject verbalized understanding of the trail requirements.  The subject agreed to participate in the REDS'@Discharge'  trial and signed the informed consent.  The informed consent was obtained prior to performance of any protocol-specific procedures for the subject.  A copy of the signed informed consent was given to the subject and a copy was placed in the subject's medical record.  Sandie Ano 06/24/2015, 11:45
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
## 2015-06-25 LAB — BASIC METABOLIC PANEL
ANION GAP: 9 (ref 5–15)
Anion gap: 10 (ref 5–15)
BUN: 51 mg/dL — ABNORMAL HIGH (ref 6–20)
BUN: 54 mg/dL — ABNORMAL HIGH (ref 6–20)
CALCIUM: 9.1 mg/dL (ref 8.9–10.3)
CHLORIDE: 97 mmol/L — AB (ref 101–111)
CO2: 27 mmol/L (ref 22–32)
CO2: 29 mmol/L (ref 22–32)
CREATININE: 1.74 mg/dL — AB (ref 0.61–1.24)
Calcium: 9.1 mg/dL (ref 8.9–10.3)
Chloride: 96 mmol/L — ABNORMAL LOW (ref 101–111)
Creatinine, Ser: 1.52 mg/dL — ABNORMAL HIGH (ref 0.61–1.24)
GFR calc non Af Amer: 43 mL/min — ABNORMAL LOW (ref >60)
GFR, EST AFRICAN AMERICAN: 50 mL/min — AB (ref >60)
GFR, EST AFRICAN AMERICAN: 43 mL/min — AB (ref >60)
GFR, EST NON AFRICAN AMERICAN: 37 mL/min — AB (ref >60)
Glucose, Bld: 91 mg/dL (ref 65–99)
Glucose, Bld: 123 mg/dL — ABNORMAL HIGH (ref 65–99)
POTASSIUM: 3.5 mmol/L (ref 3.5–5.1)
Potassium: 3.9 mmol/L (ref 3.5–5.1)
Sodium: 134 mmol/L — ABNORMAL LOW (ref 135–145)
Sodium: 134 mmol/L — ABNORMAL LOW (ref 135–145)

## 2015-06-25 LAB — CBC
HCT: 37.2 % — ABNORMAL LOW (ref 39.0–52.0)
Hemoglobin: 11.9 g/dL — ABNORMAL LOW (ref 13.0–17.0)
MCH: 30.4 pg (ref 26.0–34.0)
MCHC: 32 g/dL (ref 30.0–36.0)
MCV: 94.9 fL (ref 78.0–100.0)
PLATELETS: 138 10*3/uL — AB (ref 150–400)
RBC: 3.92 MIL/uL — ABNORMAL LOW (ref 4.22–5.81)
RDW: 15.7 % — AB (ref 11.5–15.5)
WBC: 5.6 10*3/uL (ref 4.0–10.5)

## 2015-06-25 LAB — PROTIME-INR
INR: 1.34 (ref 0.00–1.49)
Prothrombin Time: 16.7 seconds — ABNORMAL HIGH (ref 11.6–15.2)

## 2015-06-25 LAB — MAGNESIUM: Magnesium: 2.8 mg/dL — ABNORMAL HIGH (ref 1.7–2.4)

## 2015-06-25 MED ORDER — LOSARTAN POTASSIUM 25 MG PO TABS: 12.5000 mg | ORAL_TABLET | Freq: Every day | ORAL | Status: DC

## 2015-06-25 MED ORDER — SODIUM CHLORIDE 0.9 % IV SOLN: INTRAVENOUS | Status: DC

## 2015-06-25 MED ORDER — SODIUM CHLORIDE 0.9% FLUSH: 3.0000 mL | INTRAVENOUS | Status: DC | PRN

## 2015-06-25 MED ORDER — SODIUM CHLORIDE 0.9% FLUSH: 3.0000 mL | Freq: Two times a day (BID) | INTRAVENOUS | Status: DC

## 2015-06-25 MED ORDER — FUROSEMIDE 10 MG/ML IJ SOLN
80.0000 mg | Freq: Two times a day (BID) | INTRAMUSCULAR | Status: DC
  Administered 2015-06-25 (×2): 80 mg via INTRAVENOUS
  Filled 2015-06-25 (×2): qty 8

## 2015-06-25 MED ORDER — ATORVASTATIN CALCIUM 20 MG PO TABS
20.0000 mg | ORAL_TABLET | Freq: Every day | ORAL | Status: DC
  Administered 2015-06-25 – 2015-06-29 (×5): 20 mg via ORAL
  Filled 2015-06-25 (×5): qty 1

## 2015-06-25 MED ORDER — MAGNESIUM SULFATE 2 GM/50ML IV SOLN
2.0000 g | Freq: Once | INTRAVENOUS | Status: AC
  Administered 2015-06-25: 2 g via INTRAVENOUS
  Filled 2015-06-25: qty 50

## 2015-06-25 MED ORDER — ASPIRIN 81 MG PO CHEW
81.0000 mg | CHEWABLE_TABLET | ORAL | Status: AC
  Administered 2015-06-26: 81 mg via ORAL
  Filled 2015-06-25: qty 1

## 2015-06-25 MED ORDER — SODIUM CHLORIDE 0.9 % IV SOLN: 250.0000 mL | INTRAVENOUS | Status: DC | PRN

## 2015-06-25 MED ORDER — POTASSIUM CHLORIDE CRYS ER 20 MEQ PO TBCR
40.0000 meq | EXTENDED_RELEASE_TABLET | Freq: Two times a day (BID) | ORAL | Status: DC
  Administered 2015-06-25 – 2015-06-29 (×8): 40 meq via ORAL
  Filled 2015-06-25 (×9): qty 2

## 2015-06-25 MED ORDER — AMIODARONE HCL 200 MG PO TABS
200.0000 mg | ORAL_TABLET | Freq: Two times a day (BID) | ORAL | Status: DC
  Administered 2015-06-25 – 2015-06-29 (×9): 200 mg via ORAL
  Filled 2015-06-25 (×10): qty 1

## 2015-06-25 MED ORDER — FUROSEMIDE 80 MG PO TABS: 80.0000 mg | ORAL_TABLET | Freq: Two times a day (BID) | ORAL | Status: DC
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
## 2015-06-25 NOTE — Discharge Instructions (Signed)
No driving until cleared by cardiology

## 2015-06-25 NOTE — Progress Notes (Signed)
This am patient HR drop to 30 not sustain, pt. Asymptomatic, HF NP notified see manage order for new order. Will continue to monitor patient.
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
## 2015-06-25 NOTE — Progress Notes (Signed)
ReDS Vest Discharge Study  Results of ReDS reading  Your patient is in the Unblinded arm of the Vest at Discharge study.  The ReDS reading is:  ( >39) = 41  Your patient will not be discharged at this time and will have a AHF team consult.  Thank You   The research team
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
## 2015-06-26 ENCOUNTER — Encounter (HOSPITAL_COMMUNITY): Payer: Self-pay | Admitting: Cardiology

## 2015-06-26 ENCOUNTER — Encounter (HOSPITAL_COMMUNITY)
Admission: EM | Disposition: A | Discharge status: Home Health | Payer: Self-pay | Source: Home / Self Care | Attending: Internal Medicine

## 2015-06-26 DIAGNOSIS — I509 Heart failure, unspecified: Secondary | ICD-10-CM

## 2015-06-26 HISTORY — PX: CARDIAC CATHETERIZATION: SHX172

## 2015-06-26 LAB — POCT I-STAT 3, VENOUS BLOOD GAS (G3P V)
Acid-base deficit: 19 mmol/L — ABNORMAL HIGH (ref 0.0–2.0)
Bicarbonate: 7.2 mEq/L — ABNORMAL LOW (ref 20.0–24.0)
O2 Saturation: 71 %
PCO2 VEN: 17.3 mmHg — AB (ref 45.0–50.0)
PO2 VEN: 43 mmHg (ref 31.0–45.0)
TCO2: 8 mmol/L (ref 0–100)
pH, Ven: 7.224 — ABNORMAL LOW (ref 7.250–7.300)

## 2015-06-26 LAB — CBC
HEMATOCRIT: 35.3 % — AB (ref 39.0–52.0)
HEMOGLOBIN: 11.4 g/dL — AB (ref 13.0–17.0)
MCH: 30.4 pg (ref 26.0–34.0)
MCHC: 32.3 g/dL (ref 30.0–36.0)
MCV: 94.1 fL (ref 78.0–100.0)
Platelets: 134 10*3/uL — ABNORMAL LOW (ref 150–400)
RBC: 3.75 MIL/uL — ABNORMAL LOW (ref 4.22–5.81)
RDW: 15.9 % — ABNORMAL HIGH (ref 11.5–15.5)
WBC: 5.9 10*3/uL (ref 4.0–10.5)

## 2015-06-26 LAB — CREATININE, SERUM
Creatinine, Ser: 1.66 mg/dL — ABNORMAL HIGH (ref 0.61–1.24)
GFR calc Af Amer: 45 mL/min — ABNORMAL LOW (ref >60)
GFR, EST NON AFRICAN AMERICAN: 39 mL/min — AB (ref >60)

## 2015-06-26 LAB — BASIC METABOLIC PANEL
Anion gap: 11 (ref 5–15)
BUN: 56 mg/dL — AB (ref 6–20)
CALCIUM: 9.1 mg/dL (ref 8.9–10.3)
CO2: 26 mmol/L (ref 22–32)
CREATININE: 1.59 mg/dL — AB (ref 0.61–1.24)
Chloride: 98 mmol/L — ABNORMAL LOW (ref 101–111)
GFR calc non Af Amer: 41 mL/min — ABNORMAL LOW (ref >60)
GFR, EST AFRICAN AMERICAN: 48 mL/min — AB (ref >60)
Glucose, Bld: 116 mg/dL — ABNORMAL HIGH (ref 65–99)
Potassium: 4.2 mmol/L (ref 3.5–5.1)
Sodium: 135 mmol/L (ref 135–145)

## 2015-06-26 SURGERY — RIGHT HEART CATH
Anesthesia: LOCAL

## 2015-06-26 MED ORDER — ENOXAPARIN SODIUM 40 MG/0.4ML ~~LOC~~ SOLN
40.0000 mg | SUBCUTANEOUS | Status: DC
  Administered 2015-06-27 – 2015-06-29 (×3): 40 mg via SUBCUTANEOUS
  Filled 2015-06-26 (×4): qty 0.4

## 2015-06-26 MED ORDER — HEPARIN (PORCINE) IN NACL 2-0.9 UNIT/ML-% IJ SOLN
INTRAMUSCULAR | Status: AC
  Filled 2015-06-26: qty 1000

## 2015-06-26 MED ORDER — ONDANSETRON HCL 4 MG/2ML IJ SOLN: 4.0000 mg | Freq: Four times a day (QID) | INTRAMUSCULAR | Status: DC | PRN

## 2015-06-26 MED ORDER — HEPARIN (PORCINE) IN NACL 2-0.9 UNIT/ML-% IJ SOLN
INTRAMUSCULAR | Status: DC | PRN
  Administered 2015-06-26: 500 mL

## 2015-06-26 MED ORDER — LIDOCAINE HCL (PF) 1 % IJ SOLN
INTRAMUSCULAR | Status: DC | PRN
  Administered 2015-06-26: 1 mL

## 2015-06-26 MED ORDER — SODIUM CHLORIDE 0.9% FLUSH: 3.0000 mL | INTRAVENOUS | Status: DC | PRN

## 2015-06-26 MED ORDER — SODIUM CHLORIDE 0.9% FLUSH
3.0000 mL | Freq: Two times a day (BID) | INTRAVENOUS | Status: DC
  Administered 2015-06-26 – 2015-06-29 (×5): 3 mL via INTRAVENOUS

## 2015-06-26 MED ORDER — ACETAMINOPHEN 325 MG PO TABS: 650.0000 mg | ORAL_TABLET | ORAL | Status: DC | PRN

## 2015-06-26 MED ORDER — HEPARIN (PORCINE) IN NACL 2-0.9 UNIT/ML-% IJ SOLN
INTRAMUSCULAR | Status: AC
  Filled 2015-06-26: qty 500

## 2015-06-26 MED ORDER — SODIUM CHLORIDE 0.9 % IV SOLN
250.0000 mL | INTRAVENOUS | Status: DC | PRN
  Administered 2015-06-27 – 2015-06-28 (×2): 250 mL via INTRAVENOUS

## 2015-06-26 MED ORDER — LIDOCAINE HCL (PF) 1 % IJ SOLN
INTRAMUSCULAR | Status: AC
  Filled 2015-06-26: qty 30

## 2015-06-26 MED ORDER — SODIUM CHLORIDE 0.9 % IV SOLN
250.0000 mL | INTRAVENOUS | Status: DC | PRN
  Administered 2015-06-27: 250 mL via INTRAVENOUS

## 2015-06-26 MED ORDER — SPIRONOLACTONE 25 MG PO TABS
12.5000 mg | ORAL_TABLET | Freq: Every day | ORAL | Status: DC
  Administered 2015-06-26 – 2015-06-30 (×5): 12.5 mg via ORAL
  Filled 2015-06-26 (×5): qty 1

## 2015-06-26 MED ORDER — METOLAZONE 2.5 MG PO TABS
2.5000 mg | ORAL_TABLET | Freq: Once | ORAL | Status: AC
  Administered 2015-06-26: 2.5 mg via ORAL
  Filled 2015-06-26: qty 1

## 2015-06-26 MED ORDER — SODIUM CHLORIDE 0.9% FLUSH
3.0000 mL | Freq: Two times a day (BID) | INTRAVENOUS | Status: DC
  Administered 2015-06-26 – 2015-06-28 (×4): 3 mL via INTRAVENOUS

## 2015-06-26 MED ORDER — FUROSEMIDE 10 MG/ML IJ SOLN
80.0000 mg | Freq: Two times a day (BID) | INTRAMUSCULAR | Status: DC
  Administered 2015-06-26 (×2): 80 mg via INTRAVENOUS
  Filled 2015-06-26: qty 8

## 2015-06-26 SURGICAL SUPPLY — 6 items
CATH BALLN WEDGE 5F 110CM (CATHETERS) ×1 IMPLANT
KIT HEART LEFT (KITS) ×2 IMPLANT
PACK CARDIAC CATHETERIZATION (CUSTOM PROCEDURE TRAY) ×2 IMPLANT
SHEATH FAST CATH BRACH 5F 5CM (SHEATH) ×1 IMPLANT
TRANSDUCER W/STOPCOCK (MISCELLANEOUS) ×3 IMPLANT
WIRE EMERALD 3MM-J .025X260CM (WIRE) ×1 IMPLANT

## 2015-06-26 NOTE — Progress Notes (Signed)
HR up to 152. Pt brushing his teeth at the sink at this time. Will continue to monitor.

## 2015-06-26 NOTE — Interval H&P Note (Signed)
History and Physical Interval Note:  06/26/2015 9:29 AM  [name] Roberts  has presented today for surgery, with the diagnosis of Heart Failure   The various methods of treatment have been discussed with the patient and family. After consideration of risks, benefits and other options for treatment, the patient has consented to  Procedure(s): Right Heart Cath (N/A) as a surgical intervention .  The patient's history has been reviewed, patient examined, no change in status, stable for surgery.  I have reviewed the patient's chart and labs.  Questions were answered to the patient's satisfaction.     Berdella Bacot [employer]
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
## 2015-06-26 NOTE — Progress Notes (Signed)
Mag = 2.4 this PM. MD paged. No new orders at this time. Will continue to monitor.

## 2015-06-26 NOTE — Progress Notes (Signed)
0900 Pt called his wife before transfer to cardiac cath

## 2015-06-26 NOTE — Progress Notes (Signed)
PT Cancellation Note  Patient Details Name: TAYSOM ZENTNER MRN: [id number] DOB: Apr 28, 1940   Cancelled Treatment:    Reason Eval/Treat Not Completed: Patient at procedure or test/unavailable (pt currently off floor at cath lab).  PT will follow.  Encarnacion Chu PT, DPT  Pager: [phone] Phone: [phone] 06/26/2015, 10:08 AM
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
## 2015-06-26 NOTE — Progress Notes (Signed)
0855 BP  Low as recorded. Called and spoken with [facility] RN Cardiac Cath  . Ok to bring up pt to cardiac cath . They will notify MD . Pt aysmptomatic No Bp med given [id number]

## 2015-06-27 ENCOUNTER — Encounter: Payer: Self-pay | Admitting: Cardiology

## 2015-06-27 LAB — COMPREHENSIVE METABOLIC PANEL
ALBUMIN: 3.5 g/dL (ref 3.5–5.0)
ALK PHOS: 104 U/L (ref 38–126)
ALT: 35 U/L (ref 17–63)
ANION GAP: 11 (ref 5–15)
AST: 46 U/L — ABNORMAL HIGH (ref 15–41)
BILIRUBIN TOTAL: 1.7 mg/dL — AB (ref 0.3–1.2)
BUN: 60 mg/dL — AB (ref 6–20)
CALCIUM: 9.1 mg/dL (ref 8.9–10.3)
CO2: 26 mmol/L (ref 22–32)
CREATININE: 1.77 mg/dL — AB (ref 0.61–1.24)
Chloride: 97 mmol/L — ABNORMAL LOW (ref 101–111)
GFR calc Af Amer: 42 mL/min — ABNORMAL LOW (ref >60)
GFR calc non Af Amer: 36 mL/min — ABNORMAL LOW (ref >60)
GLUCOSE: 109 mg/dL — AB (ref 65–99)
Potassium: 4 mmol/L (ref 3.5–5.1)
SODIUM: 134 mmol/L — AB (ref 135–145)
TOTAL PROTEIN: 6.6 g/dL (ref 6.5–8.1)

## 2015-06-27 LAB — CBC
HEMATOCRIT: 34.3 % — AB (ref 39.0–52.0)
Hemoglobin: 11.1 g/dL — ABNORMAL LOW (ref 13.0–17.0)
MCH: 30.9 pg (ref 26.0–34.0)
MCHC: 32.4 g/dL (ref 30.0–36.0)
MCV: 95.5 fL (ref 78.0–100.0)
Platelets: 138 10*3/uL — ABNORMAL LOW (ref 150–400)
RBC: 3.59 MIL/uL — ABNORMAL LOW (ref 4.22–5.81)
RDW: 15.8 % — AB (ref 11.5–15.5)
WBC: 5.5 10*3/uL (ref 4.0–10.5)

## 2015-06-27 MED ORDER — MILRINONE LACTATE IN DEXTROSE 20-5 MG/100ML-% IV SOLN
0.2500 ug/kg/min | INTRAVENOUS | Status: DC
  Administered 2015-06-27 – 2015-06-29 (×4): 0.25 ug/kg/min via INTRAVENOUS
  Filled 2015-06-27 (×4): qty 100

## 2015-06-27 MED ORDER — FUROSEMIDE 40 MG PO TABS: 40.0000 mg | ORAL_TABLET | Freq: Two times a day (BID) | ORAL | Status: DC

## 2015-06-27 MED ORDER — FUROSEMIDE 10 MG/ML IJ SOLN
40.0000 mg | Freq: Once | INTRAMUSCULAR | Status: AC
  Administered 2015-06-27: 40 mg via INTRAVENOUS
  Filled 2015-06-27: qty 4

## 2015-06-27 MED ORDER — FUROSEMIDE 10 MG/ML IJ SOLN
10.0000 mg/h | INTRAVENOUS | Status: DC
  Administered 2015-06-27 – 2015-06-29 (×3): 10 mg/h via INTRAVENOUS
  Filled 2015-06-27 (×6): qty 25

## 2015-06-27 MED FILL — Heparin Sodium (Porcine) 2 Unit/ML in Sodium Chloride 0.9%: INTRAMUSCULAR | Qty: 500 | Status: AC
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
## 2015-06-27 NOTE — Care Management Important Message (Signed)
Important Message  Patient Details  Name: [name] Roberts MRN: [id number] Date of Birth: [birth year]   Medicare Important Message Given:  Yes    Bernadette Hoit 06/27/2015, 9:30 AM

## 2015-06-27 NOTE — Care Management Note (Signed)
Case Management Note  Patient Details  Name: [name] Roberts MRN: [id number] Date of Birth: 04-04-1940  Subjective/Objective:    Admitted with CHF                Action/Plan: Patient lives at home with spouse, independent continues to drive, no DME used at this time. He goes to the [facility] 3 times a week. Has private insurance with [employer] with prescription drug coverage; pharmacy of choice is [facility], no problems getting his medication. Physical therapy recommends HHPT, patient refused HHC at this time. CM informed patient that if he changed his mind once he is at home, his PCP can make the arrangements from his office. He has scales at home and weigh himself daily, eats a heart health diet low in sodium. He has a walker in his room but states " I cannot use it, I cant walk with that." CM will continue to follow for DCP.  Expected Discharge Date:     Possibly 06/30/2015             Expected Discharge Plan:  Home w Home Health Services  Discharge planning Services  CM Consult  Choice offered to:  Patient, Spouse, Adult Children  HH Arranged:  Patient Refused  Status of Service:  In process, will continue to follow  Medicare Important Message Given:  Yes  Reola Mosher [phone] 06/27/2015, 10:56 AM

## 2015-06-28 LAB — CBC
HCT: 32.3 % — ABNORMAL LOW (ref 39.0–52.0)
Hemoglobin: 10.5 g/dL — ABNORMAL LOW (ref 13.0–17.0)
MCH: 30.3 pg (ref 26.0–34.0)
MCHC: 32.5 g/dL (ref 30.0–36.0)
MCV: 93.1 fL (ref 78.0–100.0)
PLATELETS: 145 10*3/uL — AB (ref 150–400)
RBC: 3.47 MIL/uL — ABNORMAL LOW (ref 4.22–5.81)
RDW: 15.6 % — AB (ref 11.5–15.5)
WBC: 5.7 10*3/uL (ref 4.0–10.5)

## 2015-06-28 LAB — BASIC METABOLIC PANEL
Anion gap: 10 (ref 5–15)
BUN: 62 mg/dL — AB (ref 6–20)
CALCIUM: 9.2 mg/dL (ref 8.9–10.3)
CO2: 28 mmol/L (ref 22–32)
CREATININE: 1.71 mg/dL — AB (ref 0.61–1.24)
Chloride: 96 mmol/L — ABNORMAL LOW (ref 101–111)
GFR, EST AFRICAN AMERICAN: 44 mL/min — AB (ref >60)
GFR, EST NON AFRICAN AMERICAN: 38 mL/min — AB (ref >60)
Glucose, Bld: 99 mg/dL (ref 65–99)
Potassium: 3.8 mmol/L (ref 3.5–5.1)
SODIUM: 134 mmol/L — AB (ref 135–145)

## 2015-06-28 MED ORDER — CARVEDILOL 3.125 MG PO TABS
3.1250 mg | ORAL_TABLET | Freq: Two times a day (BID) | ORAL | Status: DC
  Administered 2015-06-29 – 2015-06-30 (×3): 3.125 mg via ORAL
  Filled 2015-06-28 (×4): qty 1
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
## 2015-06-28 NOTE — Progress Notes (Signed)
Pt refused bed alarm on. 
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
## 2015-06-29 LAB — COMPREHENSIVE METABOLIC PANEL
ALBUMIN: 3.4 g/dL — AB (ref 3.5–5.0)
ALK PHOS: 132 U/L — AB (ref 38–126)
ALT: 34 U/L (ref 17–63)
ANION GAP: 10 (ref 5–15)
AST: 42 U/L — ABNORMAL HIGH (ref 15–41)
BILIRUBIN TOTAL: 2.1 mg/dL — AB (ref 0.3–1.2)
BUN: 57 mg/dL — ABNORMAL HIGH (ref 6–20)
CO2: 27 mmol/L (ref 22–32)
Calcium: 9.4 mg/dL (ref 8.9–10.3)
Chloride: 96 mmol/L — ABNORMAL LOW (ref 101–111)
Creatinine, Ser: 1.73 mg/dL — ABNORMAL HIGH (ref 0.61–1.24)
GFR calc Af Amer: 43 mL/min — ABNORMAL LOW (ref >60)
GFR, EST NON AFRICAN AMERICAN: 37 mL/min — AB (ref >60)
GLUCOSE: 105 mg/dL — AB (ref 65–99)
POTASSIUM: 4.4 mmol/L (ref 3.5–5.1)
Sodium: 133 mmol/L — ABNORMAL LOW (ref 135–145)
TOTAL PROTEIN: 7 g/dL (ref 6.5–8.1)

## 2015-06-29 LAB — AMMONIA: AMMONIA: 71 umol/L — AB (ref 9–35)

## 2015-06-29 MED ORDER — TORSEMIDE 20 MG PO TABS
60.0000 mg | ORAL_TABLET | Freq: Every day | ORAL | Status: DC
  Administered 2015-06-29 – 2015-06-30 (×2): 60 mg via ORAL
  Filled 2015-06-29 (×2): qty 3

## 2015-06-29 MED ORDER — POTASSIUM CHLORIDE CRYS ER 20 MEQ PO TBCR
40.0000 meq | EXTENDED_RELEASE_TABLET | Freq: Every day | ORAL | Status: DC
  Administered 2015-06-30: 40 meq via ORAL
  Filled 2015-06-29: qty 2
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
## 2015-06-30 ENCOUNTER — Other Ambulatory Visit (HOSPITAL_COMMUNITY): Payer: Self-pay | Admitting: Student

## 2015-06-30 DIAGNOSIS — I5021 Acute systolic (congestive) heart failure: Secondary | ICD-10-CM

## 2015-06-30 LAB — BASIC METABOLIC PANEL
Anion gap: 11 (ref 5–15)
BUN: 60 mg/dL — AB (ref 6–20)
CHLORIDE: 94 mmol/L — AB (ref 101–111)
CO2: 27 mmol/L (ref 22–32)
CREATININE: 1.85 mg/dL — AB (ref 0.61–1.24)
Calcium: 9.3 mg/dL (ref 8.9–10.3)
GFR calc Af Amer: 40 mL/min — ABNORMAL LOW (ref >60)
GFR calc non Af Amer: 34 mL/min — ABNORMAL LOW (ref >60)
GLUCOSE: 107 mg/dL — AB (ref 65–99)
POTASSIUM: 4.5 mmol/L (ref 3.5–5.1)
Sodium: 132 mmol/L — ABNORMAL LOW (ref 135–145)

## 2015-06-30 MED ORDER — CARVEDILOL 3.125 MG PO TABS: 3.1250 mg | ORAL_TABLET | Freq: Two times a day (BID) | ORAL | Status: DC

## 2015-06-30 MED ORDER — TORSEMIDE 20 MG PO TABS: 60.0000 mg | ORAL_TABLET | Freq: Every day | ORAL | Status: DC

## 2015-06-30 MED ORDER — AMIODARONE HCL 200 MG PO TABS: 200.0000 mg | ORAL_TABLET | Freq: Every day | ORAL | Status: DC

## 2015-06-30 MED ORDER — AMIODARONE HCL 200 MG PO TABS
200.0000 mg | ORAL_TABLET | Freq: Every day | ORAL | Status: DC
  Administered 2015-06-30: 200 mg via ORAL
  Filled 2015-06-30: qty 1

## 2015-06-30 MED ORDER — SPIRONOLACTONE 25 MG PO TABS: 12.5000 mg | ORAL_TABLET | Freq: Every day | ORAL | Status: DC

## 2015-06-30 MED ORDER — ATORVASTATIN CALCIUM 20 MG PO TABS: 20.0000 mg | ORAL_TABLET | Freq: Every day | ORAL | Status: DC
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
## 2015-06-30 NOTE — Progress Notes (Signed)
I reviewed patient's appt date and time.  Patient given map and directions to [facility].

## 2015-06-30 NOTE — Research (Signed)
ReDS Vest Discharge Study  Results of ReDS  Re-reading  Your patient is in the Unblinded arm of the Vest at Discharge study.  The ReDS reading is:   ( < 39)  = 42.  Dr Shirlee Latch made aware of vest reading.  Thank You   The research team

## 2015-06-30 NOTE — Progress Notes (Signed)
CM talked to patient again about [facility] services, pt refused again. CM informed patient that if he changed his mind, his PCP can make arrangements from his office for [facility] services. Patient is agreeable to having a cane at dc. Jermaine with [facility] called for a cane to be delivered to his room today prior to discharging home. Abelino Derrick [facility] [phone]
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
## 2015-07-01 ENCOUNTER — Telehealth: Payer: Self-pay | Admitting: *Deleted

## 2015-07-01 ENCOUNTER — Telehealth: Payer: Self-pay

## 2015-07-01 NOTE — Telephone Encounter (Signed)
Patient referred to [facility] clinic by Gypsy Balsam, NP.  ICM intro given and he agreed to monthly follow up calls.  He reported he was discharged from hospital on 06/30/2015 due to CHF.  He stated he had stomach bloating, SOB when walking and activities, weight gain.  He stated his weight currently is 160 lbs and he weighs daily.  He denied any fluid symptoms and provided number if he develops any symptoms.  He reported he is doing ok at this time.  He has HF clinic appointment on 07/08/2015.  ICM remote transmission scheduled for 07/17/2015.

## 2015-07-01 NOTE — Telephone Encounter (Signed)
Transitional care call attempted.  Left message for patient to return call. 

## 2015-07-02 ENCOUNTER — Telehealth: Payer: Self-pay

## 2015-07-02 NOTE — Telephone Encounter (Signed)
Transition Care Management Follow-up Telephone Call   Date discharged? 06/30/2015   How have you been since you were released from the hospital? Doing fine, moving around the house without any problems   Do you understand why you were in the hospital? yes  Do you understand the discharge instructions? yes  Where were you discharged to? Home    Items Reviewed:  Medications reviewed: yes  Allergies reviewed: yes  Dietary changes reviewed: no Referrals reviewed: yes Cardiology 07/08/15  Functional Questionnaire:   Activities of Daily Living (ADLs):   He states they are independent in the following: ambulation, bathing and hygiene, feeding, continence, grooming, toileting and dressing   States they require assistance with the following:  none, back to baseline    Any transportation issues/concerns?: no   Any patient concerns? no   Confirmed importance and date/time of follow-up visits scheduled yes Provider Appointment booked with  Dr Ermalene Searing 07/10/15 Confirmed with patient if condition begins to worsen call PCP or go to the ER.  Patient was given the office number and encouraged to call back with question or concerns.  :  Yes

## 2015-07-02 NOTE — Telephone Encounter (Signed)
Returned call to patient.  He reported he is not sure he could send a remote transmission.  He has 2 monitors and not sure if either one worked.  Attempted to assist him with sending a remote transmission and explained he should use the same monitor he used in April since that transmission was successful.  Spent 30 minutes trying to understand which monitor he has and to assist with sending remote.  Provider tech service number and advised to call for further assistance.

## 2015-07-03 ENCOUNTER — Other Ambulatory Visit: Payer: Medicare HMO | Admitting: *Deleted

## 2015-07-03 ENCOUNTER — Other Ambulatory Visit (INDEPENDENT_AMBULATORY_CARE_PROVIDER_SITE_OTHER): Payer: Medicare HMO | Admitting: *Deleted

## 2015-07-03 ENCOUNTER — Telehealth (HOSPITAL_COMMUNITY): Payer: Self-pay | Admitting: *Deleted

## 2015-07-03 DIAGNOSIS — I5021 Acute systolic (congestive) heart failure: Secondary | ICD-10-CM | POA: Diagnosis not present

## 2015-07-03 LAB — BRAIN NATRIURETIC PEPTIDE: BRAIN NATRIURETIC PEPTIDE: 1772.8 pg/mL — AB (ref <100)

## 2015-07-03 NOTE — Addendum Note (Signed)
Addended by: Uzziel Russey K on: 07/03/2015 09:02 AM   Modules accepted: Orders  

## 2015-07-03 NOTE — Telephone Encounter (Signed)
Pt called to report his wt is up since he has been home from the hospital.  He states Mon 6/5 wt was 159 lb and it has gradually increased to 161 yesterday and is at 163.4 lb today.  He denies LE edema or sob but does report his abd feels a little tighter.  He states he has been taking all meds including torsemide 60 mg daily.  Per Tonye Becket, NP have pt take 40 mg of Torsemide tonight.  Pt is aware and agreeable, he will continue to monitor wt and keep appt as sch on Tue 6/13

## 2015-07-03 NOTE — Addendum Note (Signed)
Addended by: Tonita Phoenix on: 07/03/2015 09:02 AM   Modules accepted: Orders
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
## 2015-07-08 ENCOUNTER — Encounter: Payer: Medicare HMO | Admitting: Physician Assistant

## 2015-07-08 ENCOUNTER — Ambulatory Visit (HOSPITAL_COMMUNITY)
Admit: 2015-07-08 | Discharge: 2015-07-08 | Disposition: A | Discharge status: Home / Self Care | Payer: Medicare HMO | Source: Ambulatory Visit | Attending: Internal Medicine | Admitting: Internal Medicine

## 2015-07-08 VITALS — BP 90/56 | HR 58 | Wt 167.4 lb

## 2015-07-08 DIAGNOSIS — I2510 Atherosclerotic heart disease of native coronary artery without angina pectoris: Secondary | ICD-10-CM | POA: Diagnosis not present

## 2015-07-08 DIAGNOSIS — M109 Gout, unspecified: Secondary | ICD-10-CM | POA: Insufficient documentation

## 2015-07-08 DIAGNOSIS — Z79899 Other long term (current) drug therapy: Secondary | ICD-10-CM | POA: Diagnosis not present

## 2015-07-08 DIAGNOSIS — K7460 Unspecified cirrhosis of liver: Secondary | ICD-10-CM | POA: Diagnosis not present

## 2015-07-08 DIAGNOSIS — Z8701 Personal history of pneumonia (recurrent): Secondary | ICD-10-CM | POA: Insufficient documentation

## 2015-07-08 DIAGNOSIS — Z7982 Long term (current) use of aspirin: Secondary | ICD-10-CM | POA: Insufficient documentation

## 2015-07-08 DIAGNOSIS — I5022 Chronic systolic (congestive) heart failure: Secondary | ICD-10-CM | POA: Insufficient documentation

## 2015-07-08 DIAGNOSIS — R188 Other ascites: Secondary | ICD-10-CM | POA: Diagnosis not present

## 2015-07-08 DIAGNOSIS — I519 Heart disease, unspecified: Secondary | ICD-10-CM

## 2015-07-08 DIAGNOSIS — N179 Acute kidney failure, unspecified: Secondary | ICD-10-CM | POA: Diagnosis not present

## 2015-07-08 DIAGNOSIS — Z8249 Family history of ischemic heart disease and other diseases of the circulatory system: Secondary | ICD-10-CM | POA: Insufficient documentation

## 2015-07-08 DIAGNOSIS — R55 Syncope and collapse: Secondary | ICD-10-CM | POA: Insufficient documentation

## 2015-07-08 DIAGNOSIS — Z951 Presence of aortocoronary bypass graft: Secondary | ICD-10-CM | POA: Insufficient documentation

## 2015-07-08 DIAGNOSIS — I255 Ischemic cardiomyopathy: Secondary | ICD-10-CM | POA: Diagnosis not present

## 2015-07-08 DIAGNOSIS — I252 Old myocardial infarction: Secondary | ICD-10-CM | POA: Diagnosis not present

## 2015-07-08 DIAGNOSIS — R0683 Snoring: Secondary | ICD-10-CM | POA: Insufficient documentation

## 2015-07-08 DIAGNOSIS — N189 Chronic kidney disease, unspecified: Secondary | ICD-10-CM

## 2015-07-08 DIAGNOSIS — N183 Chronic kidney disease, stage 3 (moderate): Secondary | ICD-10-CM | POA: Diagnosis not present

## 2015-07-08 DIAGNOSIS — E785 Hyperlipidemia, unspecified: Secondary | ICD-10-CM | POA: Diagnosis not present

## 2015-07-08 DIAGNOSIS — Z95810 Presence of automatic (implantable) cardiac defibrillator: Secondary | ICD-10-CM | POA: Diagnosis not present

## 2015-07-08 DIAGNOSIS — Z87891 Personal history of nicotine dependence: Secondary | ICD-10-CM | POA: Diagnosis not present

## 2015-07-08 DIAGNOSIS — M1009 Idiopathic gout, multiple sites: Secondary | ICD-10-CM

## 2015-07-08 DIAGNOSIS — Z881 Allergy status to other antibiotic agents status: Secondary | ICD-10-CM | POA: Insufficient documentation

## 2015-07-08 DIAGNOSIS — R140 Abdominal distension (gaseous): Secondary | ICD-10-CM | POA: Diagnosis not present

## 2015-07-08 DIAGNOSIS — I5023 Acute on chronic systolic (congestive) heart failure: Secondary | ICD-10-CM | POA: Diagnosis not present

## 2015-07-08 LAB — BASIC METABOLIC PANEL
ANION GAP: 11 (ref 5–15)
BUN: 91 mg/dL — ABNORMAL HIGH (ref 6–20)
CHLORIDE: 95 mmol/L — AB (ref 101–111)
CO2: 28 mmol/L (ref 22–32)
Calcium: 9.9 mg/dL (ref 8.9–10.3)
Creatinine, Ser: 2.87 mg/dL — ABNORMAL HIGH (ref 0.61–1.24)
GFR calc non Af Amer: 20 mL/min — ABNORMAL LOW (ref >60)
GFR, EST AFRICAN AMERICAN: 23 mL/min — AB (ref >60)
Glucose, Bld: 142 mg/dL — ABNORMAL HIGH (ref 65–99)
POTASSIUM: 4.5 mmol/L (ref 3.5–5.1)
SODIUM: 134 mmol/L — AB (ref 135–145)

## 2015-07-08 NOTE — Patient Instructions (Signed)
HOLD Torsemide and Potassium and Spironolactone   Labs needed Friday with Dr.Opalski   You have been referred to [facility] for a paracentesis 07/14/2015 @ 945 AM  Your physician recommends that you schedule a follow-up appointment in: 2 weeks  Do the following things EVERYDAY: 1) Weigh yourself in the morning before breakfast. Write it down and keep it in a log. 2) Take your medicines as prescribed 3) Eat low salt foods-Limit salt (sodium) to 2000 mg per day.  4) Stay as active as you can everyday 5) Limit all fluids for the day to less than 2 liters 6)

## 2015-07-08 NOTE — Progress Notes (Signed)
Advanced Heart Failure Medication Review by a Pharmacist  Does the patient  feel that his/her medications are working for him/her?  yes  Has the patient been experiencing any side effects to the medications prescribed?  no  Does the patient measure his/her own blood pressure or blood glucose at home?  no   Does the patient have any problems obtaining medications due to transportation or finances?   no  Understanding of regimen: fair Understanding of indications: fair Potential of compliance: good Patient understands to avoid NSAIDs. Patient understands to avoid decongestants.   Pharmacist comments: [name] Roberts is a pleasant 49 yom presenting to clinic for a hospital follow-up appointment. Patient states that he is feeling much better and that he has been much less dizzy upon standing and when bending over. Patient's daughter is with his today and helps him with his medications at home. They are both slightly confused about the doses of his diuretic and potassium supplementation since hospital discharge. I clarified his current dosing regimen and updated this information in the med rec. Provider is aware of the issues with these medication doses. Patient is also running out of his medications and has no more refills. Provider aware.   Patient and daughter state that patient's swelling has gotten much better and the patient is not complaining of any medication-related side effects. Neither had any further medication-related questions or concerns.  Patient was recently discharged from hospital and all medications have been reviewed.   Time with patient: 10 min  Preparation and documentation time: 2 min  Total time: 12 min   Nkechi Linehan C. Marvis Moeller, PharmD Pharmacy Resident  Pager: [phone] 07/08/2015 9:48 AM

## 2015-07-09 ENCOUNTER — Telehealth (HOSPITAL_COMMUNITY): Payer: Self-pay | Admitting: Cardiology

## 2015-07-09 DIAGNOSIS — I509 Heart failure, unspecified: Secondary | ICD-10-CM

## 2015-07-09 NOTE — Telephone Encounter (Signed)
-----   Message from Graciella Freer, PA-C sent at 07/09/2015  9:44 AM EDT ----- Pt PCP does not have on site labs.  Needs BMET on Friday    Michael "Mardelle Matte" [place], [place] 07/09/2015 9:44 AM

## 2015-07-09 NOTE — Telephone Encounter (Signed)
Detailed message left for patient, added to lab schedule 6/16 advised patient to return call if he has an additional questions

## 2015-07-10 ENCOUNTER — Ambulatory Visit: Payer: Medicare HMO | Admitting: Family Medicine

## 2015-07-11 ENCOUNTER — Ambulatory Visit (HOSPITAL_COMMUNITY)
Admission: RE | Admit: 2015-07-11 | Discharge: 2015-07-11 | Disposition: A | Discharge status: Home / Self Care | Payer: Medicare HMO | Source: Ambulatory Visit | Attending: Internal Medicine | Admitting: Internal Medicine

## 2015-07-11 ENCOUNTER — Telehealth (HOSPITAL_COMMUNITY): Payer: Self-pay | Admitting: Cardiology

## 2015-07-11 ENCOUNTER — Ambulatory Visit (INDEPENDENT_AMBULATORY_CARE_PROVIDER_SITE_OTHER): Payer: Medicare HMO | Admitting: Family Medicine

## 2015-07-11 ENCOUNTER — Encounter: Payer: Self-pay | Admitting: Family Medicine

## 2015-07-11 ENCOUNTER — Telehealth (HOSPITAL_COMMUNITY): Payer: Self-pay

## 2015-07-11 VITALS — BP 106/71 | HR 50 | Ht 68.0 in | Wt 168.6 lb

## 2015-07-11 DIAGNOSIS — R609 Edema, unspecified: Secondary | ICD-10-CM

## 2015-07-11 DIAGNOSIS — M109 Gout, unspecified: Secondary | ICD-10-CM

## 2015-07-11 DIAGNOSIS — I5022 Chronic systolic (congestive) heart failure: Secondary | ICD-10-CM

## 2015-07-11 DIAGNOSIS — E785 Hyperlipidemia, unspecified: Secondary | ICD-10-CM

## 2015-07-11 DIAGNOSIS — M1990 Unspecified osteoarthritis, unspecified site: Secondary | ICD-10-CM

## 2015-07-11 DIAGNOSIS — N183 Chronic kidney disease, stage 3 unspecified: Secondary | ICD-10-CM

## 2015-07-11 DIAGNOSIS — I509 Heart failure, unspecified: Secondary | ICD-10-CM

## 2015-07-11 DIAGNOSIS — I25810 Atherosclerosis of coronary artery bypass graft(s) without angina pectoris: Secondary | ICD-10-CM

## 2015-07-11 DIAGNOSIS — Z95810 Presence of automatic (implantable) cardiac defibrillator: Secondary | ICD-10-CM

## 2015-07-11 DIAGNOSIS — D509 Iron deficiency anemia, unspecified: Secondary | ICD-10-CM

## 2015-07-11 DIAGNOSIS — Z87891 Personal history of nicotine dependence: Secondary | ICD-10-CM

## 2015-07-11 LAB — BASIC METABOLIC PANEL
ANION GAP: 12 (ref 5–15)
BUN: 96 mg/dL — AB (ref 6–20)
CHLORIDE: 98 mmol/L — AB (ref 101–111)
CO2: 24 mmol/L (ref 22–32)
Calcium: 9.7 mg/dL (ref 8.9–10.3)
Creatinine, Ser: 3.01 mg/dL — ABNORMAL HIGH (ref 0.61–1.24)
GFR calc Af Amer: 22 mL/min — ABNORMAL LOW (ref >60)
GFR, EST NON AFRICAN AMERICAN: 19 mL/min — AB (ref >60)
GLUCOSE: 137 mg/dL — AB (ref 65–99)
POTASSIUM: 4.3 mmol/L (ref 3.5–5.1)
Sodium: 134 mmol/L — ABNORMAL LOW (ref 135–145)

## 2015-07-11 MED ORDER — TORSEMIDE 20 MG PO TABS
60.0000 mg | ORAL_TABLET | Freq: Every day | ORAL | Status: DC
Start: 2015-07-11 — End: 2015-07-24

## 2015-07-11 NOTE — Patient Instructions (Addendum)
RTC once stable from cardiac standpoint, f/up with me for yrly PE/ prostate check in about 2-3 months   Heart Failure Heart failure is a condition in which the heart has trouble pumping blood. This means your heart does not pump blood efficiently for your body to work well. In some cases of heart failure, fluid may back up into your lungs or you may have swelling (edema) in your lower legs. Heart failure is usually a long-term (chronic) condition. It is important for you to take good care of yourself and follow your health care provider's treatment plan. CAUSES  Some health conditions can cause heart failure. Those health conditions include:  High blood pressure (hypertension). Hypertension causes the heart muscle to work harder than normal. When pressure in the blood vessels is high, the heart needs to pump (contract) with more force in order to circulate blood throughout the body. High blood pressure eventually causes the heart to become stiff and weak.  Coronary artery disease (CAD). CAD is the buildup of cholesterol and fat (plaque) in the arteries of the heart. The blockage in the arteries deprives the heart muscle of oxygen and blood. This can cause chest pain and may lead to a heart attack. High blood pressure can also contribute to CAD.  Heart attack (myocardial infarction). A heart attack occurs when one or more arteries in the heart become blocked. The loss of oxygen damages the muscle tissue of the heart. When this happens, part of the heart muscle dies. The injured tissue does not contract as well and weakens the heart's ability to pump blood.  Abnormal heart valves. When the heart valves do not open and close properly, it can cause heart failure. This makes the heart muscle pump harder to keep the blood flowing.  Heart muscle disease (cardiomyopathy or myocarditis). Heart muscle disease is damage to the heart muscle from a variety of causes. These can include drug or alcohol abuse,  infections, or unknown reasons. These can increase the risk of heart failure.  Lung disease. Lung disease makes the heart work harder because the lungs do not work properly. This can cause a strain on the heart, leading it to fail.  Diabetes. Diabetes increases the risk of heart failure. High blood sugar contributes to high fat (lipid) levels in the blood. Diabetes can also cause slow damage to tiny blood vessels that carry important nutrients to the heart muscle. When the heart does not get enough oxygen and food, it can cause the heart to become weak and stiff. This leads to a heart that does not contract efficiently.  Other conditions can contribute to heart failure. These include abnormal heart rhythms, thyroid problems, and low blood counts (anemia). Certain unhealthy behaviors can increase the risk of heart failure, including:  Being overweight.  Smoking or chewing tobacco.  Eating foods high in fat and cholesterol.  Abusing illicit drugs or alcohol.  Lacking physical activity. SYMPTOMS  Heart failure symptoms may vary and can be hard to detect. Symptoms may include:  Shortness of breath with activity, such as climbing stairs.  Persistent cough.  Swelling of the feet, ankles, legs, or abdomen.  Unexplained weight gain.  Difficulty breathing when lying flat (orthopnea).  Waking from sleep because of the need to sit up and get more air.  Rapid heartbeat.  Fatigue and loss of energy.  Feeling light-headed, dizzy, or close to fainting.  Loss of appetite.  Nausea.  Increased urination during the night (nocturia). DIAGNOSIS  A diagnosis of heart  failure is based on your history, symptoms, physical examination, and diagnostic tests. Diagnostic tests for heart failure may include:  Echocardiography.  Electrocardiography.  Chest X-ray.  Blood tests.  Exercise stress test.  Cardiac angiography.  Radionuclide scans. TREATMENT  Treatment is aimed at managing the  symptoms of heart failure. Medicines, behavioral changes, or surgical intervention may be necessary to treat heart failure.  Medicines to help treat heart failure may include:  Angiotensin-converting enzyme (ACE) inhibitors. This type of medicine blocks the effects of a blood protein called angiotensin-converting enzyme. ACE inhibitors relax (dilate) the blood vessels and help lower blood pressure.  Angiotensin receptor blockers (ARBs). This type of medicine blocks the actions of a blood protein called angiotensin. Angiotensin receptor blockers dilate the blood vessels and help lower blood pressure.  Water pills (diuretics). Diuretics cause the kidneys to remove salt and water from the blood. The extra fluid is removed through urination. This loss of extra fluid lowers the volume of blood the heart pumps.  Beta blockers. These prevent the heart from beating too fast and improve heart muscle strength.  Digitalis. This increases the force of the heartbeat.  Healthy behavior changes include:  Obtaining and maintaining a healthy weight.  Stopping smoking or chewing tobacco.  Eating heart-healthy foods.  Limiting or avoiding alcohol.  Stopping illicit drug use.  Physical activity as directed by your health care provider.  Surgical treatment for heart failure may include:  A procedure to open blocked arteries, repair damaged heart valves, or remove damaged heart muscle tissue.  A pacemaker to improve heart muscle function and control certain abnormal heart rhythms.  An internal cardioverter defibrillator to treat certain serious abnormal heart rhythms.  A left ventricular assist device (LVAD) to assist the pumping ability of the heart. HOME CARE INSTRUCTIONS   Take medicines only as directed by your health care provider. Medicines are important in reducing the workload of your heart, slowing the progression of heart failure, and improving your symptoms.  Do not stop taking your  medicine unless directed by your health care provider.  Do not skip any dose of medicine.  Refill your prescriptions before you run out of medicine. Your medicines are needed every day.  Engage in moderate physical activity if directed by your health care provider. Moderate physical activity can benefit some people. The elderly and people with severe heart failure should consult with a health care provider for physical activity recommendations.  Eat heart-healthy foods. Food choices should be free of trans fat and low in saturated fat, cholesterol, and salt (sodium). Healthy choices include fresh or frozen fruits and vegetables, fish, lean meats, legumes, fat-free or low-fat dairy products, and whole grain or high fiber foods. Talk to a dietitian to learn more about heart-healthy foods.  Limit sodium if directed by your health care provider. Sodium restriction may reduce symptoms of heart failure in some people. Talk to a dietitian to learn more about heart-healthy seasonings.  Use healthy cooking methods. Healthy cooking methods include roasting, grilling, broiling, baking, poaching, steaming, or stir-frying. Talk to a dietitian to learn more about healthy cooking methods.  Limit fluids if directed by your health care provider. Fluid restriction may reduce symptoms of heart failure in some people.  Weigh yourself every day. Daily weights are important in the early recognition of excess fluid. You should weigh yourself every morning after you urinate and before you eat breakfast. Wear the same amount of clothing each time you weigh yourself. Record your daily weight. Provide your  health care provider with your weight record.  Monitor and record your blood pressure if directed by your health care provider.  Check your pulse if directed by your health care provider.  Lose weight if directed by your health care provider. Weight loss may reduce symptoms of heart failure in some people.  Stop  smoking or chewing tobacco. Nicotine makes your heart work harder by causing your blood vessels to constrict. Do not use nicotine gum or patches before talking to your health care provider.  Keep all follow-up visits as directed by your health care provider. This is important.  Limit alcohol intake to no more than 1 drink per day for nonpregnant women and 2 drinks per day for men. One drink equals 12 ounces of beer, 5 ounces of wine, or 1 ounces of hard liquor. Drinking more than that is harmful to your heart. Tell your health care provider if you drink alcohol several times a week. Talk with your health care provider about whether alcohol is safe for you. If your heart has already been damaged by alcohol or you have severe heart failure, drinking alcohol should be stopped completely.  Stop illicit drug use.  Stay up-to-date with immunizations. It is especially important to prevent respiratory infections through current pneumococcal and influenza immunizations.  Manage other health conditions such as hypertension, diabetes, thyroid disease, or abnormal heart rhythms as directed by your health care provider.  Learn to manage stress.  Plan rest periods when fatigued.  Learn strategies to manage high temperatures. If the weather is extremely hot:  Avoid vigorous physical activity.  Use air conditioning or fans or seek a cooler location.  Avoid caffeine and alcohol.  Wear loose-fitting, lightweight, and light-colored clothing.  Learn strategies to manage cold temperatures. If the weather is extremely cold:  Avoid vigorous physical activity.  Layer clothes.  Wear mittens or gloves, a hat, and a scarf when going outside.  Avoid alcohol.  Obtain ongoing education and support as needed.  Participate in or seek rehabilitation as needed to maintain or improve independence and quality of life. SEEK MEDICAL CARE IF:   You have a rapid weight gain.  You have increasing shortness of  breath that is unusual for you.  You are unable to participate in your usual physical activities.  You tire easily.  You cough more than normal, especially with physical activity.  You have any or more swelling in areas such as your hands, feet, ankles, or abdomen.  You are unable to sleep because it is hard to breathe.  You feel like your heart is beating fast (palpitations).  You become dizzy or light-headed upon standing up. SEEK IMMEDIATE MEDICAL CARE IF:   You have difficulty breathing.  There is a change in mental status such as decreased alertness or difficulty with concentration.  You have a pain or discomfort in your chest.  You have an episode of fainting (syncope). MAKE SURE YOU:   Understand these instructions.  Will watch your condition.  Will get help right away if you are not doing well or get worse.   This information is not intended to replace advice given to you by your health care provider. Make sure you discuss any questions you have with your health care provider.   Document Released: 01/11/2005 Document Revised: 05/28/2014 Document Reviewed: 02/11/2012 Elsevier Interactive Patient Education [employer]

## 2015-07-11 NOTE — Telephone Encounter (Signed)
Pt aware and voiced understanding 

## 2015-07-11 NOTE — Telephone Encounter (Signed)
Called to remind pt of 10am appt at [facility] on 07/14/15. Pt agreed to arrive 15 min early to get checked in. AW

## 2015-07-11 NOTE — Telephone Encounter (Signed)
-----   Message from Graciella Freer, PA-C sent at 07/11/2015  1:15 PM EDT ----- Discussed with Dr. Gala Romney. Restart torsemide 60 mg daily (up 9 lbs).  Recheck BMET Monday.  If creatinine continues to climb will need to be admitted.    [name] [address] [place], PA-C 07/11/2015 1:15 PM

## 2015-07-12 ENCOUNTER — Encounter: Payer: Self-pay | Admitting: Family Medicine

## 2015-07-12 DIAGNOSIS — M1990 Unspecified osteoarthritis, unspecified site: Secondary | ICD-10-CM | POA: Insufficient documentation

## 2015-07-12 DIAGNOSIS — E785 Hyperlipidemia, unspecified: Secondary | ICD-10-CM | POA: Insufficient documentation

## 2015-07-12 DIAGNOSIS — D509 Iron deficiency anemia, unspecified: Secondary | ICD-10-CM | POA: Insufficient documentation

## 2015-07-12 NOTE — Progress Notes (Signed)
[name] Roberts, D.O. Primary care at [facility]  Subjective:    CC: New pt, here to establish care.   HPI: [name] Roberts is a pleasant 75 y.o. male who presents to [facility] Primary Care at [facility] today To become established and discuss with me his past and current medical issues.  He is here with his wife, [name] Roberts, today.  He has a complicated recent history.  He brings in multiple papers for me to review today.    They heard about [place] from the "next door" App at [facility].  Patient had been seeing Dr. Ermalene Searing, his primary care provider at [facility] and his last appointment was on May 26 with her.  But apparently, patient states he is looking for a new provider and has since lost confidence in the doctors at [facility].  Patient has an extensive cardiac history.  His first AMI was in 1983 and he has had triple bypass surgery in January 2006.  Approximately one year later, they implanted a pacemaker and approx 2 years ago, that was replaced.   He has since been dealing with CHF exaccerbations since the fall of 2016.   More recently, patient was hospitalized from 05/26 -- 06/30/2015 for acute congestive heart failure exacerbation, in which he acutely gained over 20lbs and became increasing SOB/ DOE.   I reviewed those hospital records today.   He has been dealing with CHF ever since the fall of 2016.    He is actively being managed by the heart failure specialist Dr. Marca Ancona, a cardiologist, who per patient, told him not to let anybody else change his medications without his approval.   Patient also has seen Dr. Ladona Ridgel ([facility]) for defib/pacemaker management and per pt, his primary cardiologist is Dr. Reatha Harps.  Upon discharge from the hospital, 11 days ago, after aggressive diuresis in the hospital, the cardiologist held patient's torsemide, potassium and spironolactone.   Pt's serum creatinine had been rising and his cardiologist is following that closely  now.   His serum creatinine 13 days ago was 1.7 and then 12 days ago, 1.85, then a couple days ago 2.87.  Patient is apparently going to his cardiologist's office later on today to get stat BMP to check his serum creatinine.    Today, he denies any difficulty in breathing acutely and says he feels nothing like he did back when he was hospitalized.  Denies CP/tightness/ SOB/DOE\orthopnea\paroxysmal nocturnal dyspnea\peripheral edema/ no heart palpitations/ irreg.  He sleeps on one pillow currently, which he has always done..    When he was discharged from the hospital on 06/30/2015, he weighed 159 pounds on his home scale and this morning, he weighed 164 pounds.   Per the patient and wife, the last time he was hospitalized for CHF exacerbation, he had put on over 20 pounds.        Past Medical History  Diagnosis Date  . Chronic systolic heart failure (HCC)   . Cardiomyopathy, ischemic   . CAD (coronary artery disease)   . HLD (hyperlipidemia)   . Herpes zoster   . Gout   . Heart murmur   . CHF (congestive heart failure) (HCC)   . Myocardial infarction (HCC) 1981  . Pneumonia 12/20206  . History of blood transfusion     "S/P OHS"  . Anemia   . Iron deficiency anemia     "S/P OHS"  . Arthritis     "right  thumb; posterior neck" (08/02/2013)    Past Surgical History  Procedure Laterality Date  . Appendectomy    . Cardiac defibrillator placement  2007    Dr. Ladona Ridgel  . Icd generator change  08/02/2013    "w/new leads"  . Band hemorrhoidectomy    . Cardiac catheterization  191; 2007  . Aortic valve repair  2007    "put an O ring in it"  . Coronary artery bypass graft  2007    Dr. Barry Dienes  . Pilonidal cyst excision  1961  . Implantable cardioverter defibrillator (icd) generator change N/A 08/02/2013    Procedure: ICD GENERATOR CHANGE;  Surgeon: Marinus Maw, MD;  Location: [facility] CATH LAB;  Service: Cardiovascular;  Laterality: N/A;  . Cardiac catheterization N/A 06/26/2015    Procedure:  Right Heart Cath;  Surgeon: Laurey Morale, MD;  Location: [facility] INVASIVE CV LAB;  Service: Cardiovascular;  Laterality: N/A;    Family History  Problem Relation Age of Onset  . Cancer Mother     brain cancer  . Heart disease Father     CAD AND CHF    History  Drug Use No    Comment: "a little speed when I was much younger"  ,  History  Alcohol Use No    Comment: 08/02/2013 "beer:  most weeks none; some weeks 2-3 glasses; same w/wine"  ,  History  Smoking status  . Former Smoker -- 2.50 packs/day for 56 years  . Types: Cigarettes  . Quit date: 01/18/2005  Smokeless tobacco  . Never Used  ,  History  Sexual Activity  . Sexual Activity: No    Patient's Medications  New Prescriptions   No medications on file  Previous Medications   ALLOPURINOL (ZYLOPRIM) 100 MG TABLET    TAKE 1 TABLET BY MOUTH DAILY.   ALPRAZOLAM (XANAX) 1 MG TABLET    TAKE 1 TABLET BY MOUTH AT BEDTIME   AMIODARONE (PACERONE) 200 MG TABLET    Take 1 tablet (200 mg total) by mouth daily.   ASPIRIN EC 81 MG TABLET    Take 81 mg by mouth at bedtime.    ATORVASTATIN (LIPITOR) 20 MG TABLET    Take 1 tablet (20 mg total) by mouth daily at 6 PM.   CARVEDILOL (COREG) 3.125 MG TABLET    Take 1 tablet (3.125 mg total) by mouth 2 (two) times daily with a meal.   NIACIN (NIASPAN) 500 MG CR TABLET    Take 1 tablet (500 mg total) by mouth at bedtime.   POLYVINYL ALCOHOL-POVIDONE (REFRESH) 1.4-0.6 % OPHTHALMIC SOLUTION    Place 1 drop into the left eye daily as needed (for irritation).    POTASSIUM CHLORIDE (K-DUR) 10 MEQ TABLET    Take 10 mEq by mouth daily. Reported on 07/11/2015   SPIRONOLACTONE (ALDACTONE) 25 MG TABLET    Take 0.5 tablets (12.5 mg total) by mouth daily.  Modified Medications   Modified Medication Previous Medication   TORSEMIDE (DEMADEX) 20 MG TABLET torsemide (DEMADEX) 20 MG tablet      Take 3 tablets (60 mg total) by mouth daily. Reported on 07/11/2015    Take 40-60 mg by mouth 2 (two) times daily.  Reported on 07/11/2015  Discontinued Medications   No medications on file    ALLERGIES: Erythromycin base  Review of Systems:   ( Completed via her Adult Medical History Intake form today ) General:   Denies fever, chills, appetite changes, unexplained weight loss.  Optho/Auditory:  Denies visual changes, blurred vision/LOV, ringing in ears/ diff hearing Respiratory:   Denies SOB, DOE, cough, wheezing.  Cardiovascular:   Denies chest pain, palpitations, new onset peripheral edema  Gastrointestinal:   Denies nausea, vomiting, diarrhea.  Genitourinary:    Denies dysuria, increased frequency, + nitetime urination  Endocrine:     Denies hot or cold intolerance, polyuria, polydipsia. Musculoskeletal:  Denies unexplained myalgias, joint swelling, +neck pain/jt pains  Skin:  Denies rash, suspicious lesions or new/ changes in moles, + easy bruising Neurological:    Denies dizziness, unexplained weakness, numbness, + sleep problems,+ memory  Psychiatric/Behavioral:   Denies mood changes, suicidal or homicidal ideations    Objective:   Blood pressure 106/71, pulse 50, height 5\' 8"  (1.727 m), weight 168 lb 9.6 oz (76.476 kg). Body mass index is 25.64 kg/(m^2).  General: Well Developed, well nourished, and in no acute distress.  Neuro: Alert and oriented x3, extra-ocular muscles intact HEENT: Normocephalic, atraumatic, pupils equal round reactive to light, neck supple, + JVD apprec b/l Skin: no gross suspicious lesions or rashes  Cardiac: Regular rate and rhythm, +S1,S2, S3  Respiratory: Diffuse rhonchi thruout, Crackles appreciated approximately 1/3 way up lungs on the left and 1/4 the way up on the right.   Not using accessory muscles, speaking in full sentences.  Abdominal: Hard, grossly distended, + HJR, Non-tender, No G/R/R, + BS * 4. Musculoskeletal: Ambulates w/o diff, FROM * 4 ext.  Vasc: less 2 sec cap RF, warm and pink; No periph edema b/l LExt, non-pit Psych:  No HI/SI, judgement  and insight good.    Impression and Recommendations:    The patient was counselled, risk factors were discussed, anticipatory guidance given.  Systolic CHF  (Dr Marca Ancona) Class IIA; stable today.  Counseling done re: acute signs/sx CHF exacerbation requiring seeking emergent medical attention.  Pt and wife verbalize understanding.  Cont to monitor home wt's q am.  Dx actively managed by Dr Shirlee Latch [facility]  CKD (chronic kidney disease), stage III Being followed and managed acutely by [facility] who is actively managing pt's diuresis/ volume status  Hyperlipidemia Last obtained on 06/11/2015.  Patient LDL and triglyceride at goal.  HDL low at 20.  Encouraged pt to walk/exercise as tolerated  CAD  (Dr Reatha Harps) Patient without chest pain: Stable: Managed per patient's cardiologist, Dr. Dicie Beam  Gout of multiple sites No acute complaints; Sx stable.  Automatic implantable cardioverter-defibrillator in situ (Dr Ladona Ridgel- [facility]) No complaints/ irreg heartbeats/ CP etc Stable  Peripheral edema None b/l LExt today  --->  f/up with me for yrly PE / prostate check and other health maintenance issues in about 2-3 months since recently had blood work in hospital.   Patient will continue to hold medications spironolactone, furosemide, and his potassium until further notice by cardiology.  And pt understands that med mgt of Cardiac/Heart failure meds will come from Cardiology.   Gross side effects, risk and benefits, and alternatives of medications discussed with patient.  Patient is aware that all medications have potential side effects and we are unable to predict every sideeffect or drug-drug interaction that may occur.  Expresses verbal understanding and consents to current therapy plan and treatment regiment.  Note: This document was prepared using Dragon voice recognition software and may include unintentional dictation errors.

## 2015-07-12 NOTE — Assessment & Plan Note (Signed)
No acute complaints; Sx stable.

## 2015-07-12 NOTE — Assessment & Plan Note (Signed)
Being followed and managed acutely by [facility] who is actively managing pt's diuresis/ volume status

## 2015-07-12 NOTE — Assessment & Plan Note (Addendum)
Class IIA; stable today.  Counseling done re: acute signs/sx CHF exacerbation requiring seeking emergent medical attention.  Pt and wife verbalize understanding.  Cont to monitor home wt's q am.  Dx actively managed by Dr Shirlee Latch [facility]

## 2015-07-12 NOTE — Assessment & Plan Note (Signed)
No complaints/ irreg heartbeats/ CP etc Stable

## 2015-07-12 NOTE — Assessment & Plan Note (Addendum)
Last obtained on 06/11/2015.  Patient LDL and triglyceride at goal.  HDL low at 20.  Encouraged pt to walk/exercise as tolerated

## 2015-07-12 NOTE — Assessment & Plan Note (Signed)
None b/l LExt today

## 2015-07-12 NOTE — Assessment & Plan Note (Signed)
Patient without chest pain: Stable: Managed per patient's cardiologist, Dr. Dicie Beam

## 2015-07-14 ENCOUNTER — Ambulatory Visit (HOSPITAL_BASED_OUTPATIENT_CLINIC_OR_DEPARTMENT_OTHER)
Admission: RE | Admit: 2015-07-14 | Discharge: 2015-07-14 | Disposition: A | Discharge status: Home / Self Care | Payer: Medicare HMO | Source: Ambulatory Visit | Attending: Cardiology | Admitting: Cardiology

## 2015-07-14 ENCOUNTER — Telehealth (HOSPITAL_COMMUNITY): Payer: Self-pay | Admitting: *Deleted

## 2015-07-14 ENCOUNTER — Encounter: Payer: Self-pay | Admitting: Cardiology

## 2015-07-14 ENCOUNTER — Ambulatory Visit (HOSPITAL_COMMUNITY)
Admission: RE | Admit: 2015-07-14 | Discharge: 2015-07-14 | Disposition: A | Discharge status: Home / Self Care | Payer: Medicare HMO | Source: Ambulatory Visit | Attending: Student | Admitting: Student

## 2015-07-14 DIAGNOSIS — I509 Heart failure, unspecified: Secondary | ICD-10-CM | POA: Insufficient documentation

## 2015-07-14 DIAGNOSIS — R188 Other ascites: Secondary | ICD-10-CM | POA: Insufficient documentation

## 2015-07-14 DIAGNOSIS — I5022 Chronic systolic (congestive) heart failure: Secondary | ICD-10-CM

## 2015-07-14 LAB — BASIC METABOLIC PANEL
ANION GAP: 10 (ref 5–15)
BUN: 96 mg/dL — ABNORMAL HIGH (ref 6–20)
CHLORIDE: 99 mmol/L — AB (ref 101–111)
CO2: 27 mmol/L (ref 22–32)
Calcium: 9.5 mg/dL (ref 8.9–10.3)
Creatinine, Ser: 2.93 mg/dL — ABNORMAL HIGH (ref 0.61–1.24)
GFR calc non Af Amer: 20 mL/min — ABNORMAL LOW (ref >60)
GFR, EST AFRICAN AMERICAN: 23 mL/min — AB (ref >60)
Glucose, Bld: 160 mg/dL — ABNORMAL HIGH (ref 65–99)
POTASSIUM: 3.8 mmol/L (ref 3.5–5.1)
SODIUM: 136 mmol/L (ref 135–145)

## 2015-07-14 MED ORDER — CARVEDILOL 3.125 MG PO TABS: 3.1250 mg | ORAL_TABLET | Freq: Two times a day (BID) | ORAL | Status: DC

## 2015-07-14 MED ORDER — LIDOCAINE HCL (PF) 1 % IJ SOLN
INTRAMUSCULAR | Status: AC
  Filled 2015-07-14: qty 10

## 2015-07-14 NOTE — Addendum Note (Signed)
Addended by: Noralee Space on: 07/14/2015 02:20 PM   Modules accepted: Orders, Medications

## 2015-07-14 NOTE — Procedures (Signed)
   LLQ US guided paracentesis  6 L amber fluid obtained tolerated well

## 2015-07-14 NOTE — Telephone Encounter (Signed)
-----   Message from Noralee Space, RN sent at 07/14/2015 12:02 PM EDT -----   ----- Message -----    From: Lab In Sun[facility] Interface    Sent: 07/14/2015   9:42 AM      To: Dolores Patty, MD

## 2015-07-14 NOTE — Telephone Encounter (Signed)
Notes Recorded by Noralee Space, RN on 07/14/2015 at 2:09 PM Spoke w/pt, he is aware of results and will continue Tor 60 mg daily, he states he did have paracentesis today and they removed 6 L. Repeat labs sch for Thur 6/22 at [facility] office Notes Recorded by Noralee Space, RN on 07/14/2015 at 12:25 PM Per Dr Shirlee Latch, pt should continue Torsemide 60 mg daily for now, repeat labs Thursday. Attempted to call pt and Left message to call back

## 2015-07-17 ENCOUNTER — Ambulatory Visit (INDEPENDENT_AMBULATORY_CARE_PROVIDER_SITE_OTHER): Payer: Medicare HMO

## 2015-07-17 ENCOUNTER — Telehealth: Payer: Self-pay

## 2015-07-17 ENCOUNTER — Other Ambulatory Visit: Payer: Self-pay | Admitting: Family Medicine

## 2015-07-17 ENCOUNTER — Other Ambulatory Visit (INDEPENDENT_AMBULATORY_CARE_PROVIDER_SITE_OTHER): Payer: Medicare HMO | Admitting: *Deleted

## 2015-07-17 DIAGNOSIS — I5022 Chronic systolic (congestive) heart failure: Secondary | ICD-10-CM | POA: Diagnosis not present

## 2015-07-17 DIAGNOSIS — I2510 Atherosclerotic heart disease of native coronary artery without angina pectoris: Secondary | ICD-10-CM

## 2015-07-17 DIAGNOSIS — I2583 Coronary atherosclerosis due to lipid rich plaque: Principal | ICD-10-CM

## 2015-07-17 DIAGNOSIS — Z95810 Presence of automatic (implantable) cardiac defibrillator: Secondary | ICD-10-CM

## 2015-07-17 DIAGNOSIS — E785 Hyperlipidemia, unspecified: Secondary | ICD-10-CM | POA: Diagnosis not present

## 2015-07-17 LAB — BASIC METABOLIC PANEL
BUN: 83 mg/dL — ABNORMAL HIGH (ref 7–25)
CALCIUM: 9.2 mg/dL (ref 8.6–10.3)
CHLORIDE: 97 mmol/L — AB (ref 98–110)
CO2: 30 mmol/L (ref 20–31)
Creat: 2.61 mg/dL — ABNORMAL HIGH (ref 0.70–1.18)
GLUCOSE: 108 mg/dL — AB (ref 65–99)
Potassium: 4 mmol/L (ref 3.5–5.3)
SODIUM: 137 mmol/L (ref 135–146)

## 2015-07-17 NOTE — Telephone Encounter (Signed)
Remote ICM transmission received.  Attempted patient call and left message for return call.   

## 2015-07-17 NOTE — Addendum Note (Signed)
Addended by: Tonita Phoenix on: 07/17/2015 07:43 AM   Modules accepted: Orders

## 2015-07-17 NOTE — Addendum Note (Signed)
Addended by: BOWDEN, ROBIN K on: 07/17/2015 07:43 AM   Modules accepted: Orders  

## 2015-07-17 NOTE — Addendum Note (Signed)
Addended by: Tonita Phoenix on: 07/17/2015 07:48 AM   Modules accepted: Orders

## 2015-07-17 NOTE — Progress Notes (Signed)
EPIC Encounter for ICM Monitoring  Patient Name: [name] Roberts is a 75 y.o. male Date: 07/17/2015 Primary Care Physican: [name], DO Primary Cardiologist: Shirlee Latch Electrophysiologist: Ladona Ridgel Dry Weight: unknown      In the past month, have you:  1. Gained more than 2 pounds in a day or more than 5 pounds in a week? N/A  2. Had changes in your medications (with verification of current medications)? N/A  3. Had more shortness of breath than is usual for you? N/A  4. Limited your activity because of shortness of breath? N/A  5. Not been able to sleep because of shortness of breath? N/A  6. Had increased swelling in your feet, ankles, legs or stomach area? N/A  7. Had symptoms of dehydration (dizziness, dry mouth, increased thirst, decreased urine output) N/A  8. Had changes in sodium restriction? N/A  9. Been compliant with medication? N/A  ICM trend: 3 month view for 07/17/2015   ICM trend: 1 year view for 07/17/2015   Follow-up plan: ICM clinic phone appointment 09/02/2015.   Office appointment with CHF clinic 07/23/2015 and with Dr Ladona Ridgel 07/30/2015.  Attempted call to patient and unable to reach.  Transmission reviewed.  FLUID LEVELS: Optivol thoracic impedance decreased 05/13/2015 to 06/21/2015 suggesting fluid accumulation and returned to baseline 06/21/2015 and above reference line through 07/17/2015.    Advised will send to PCP, Dr. Shirlee Latch and Dr. Ladona Ridgel for review.    Karie Soda, RN, CCM 07/17/2015 2:31 PM

## 2015-07-22 ENCOUNTER — Encounter: Payer: Self-pay | Admitting: Internal Medicine

## 2015-07-22 NOTE — Assessment & Plan Note (Signed)
Well controlled. Continue current medication. Encouraged exercise, weight loss, healthy eating habits.  

## 2015-07-22 NOTE — Assessment & Plan Note (Signed)
No recent flares on allopurinol low dose.

## 2015-07-22 NOTE — Assessment & Plan Note (Signed)
Slight worsening. Will need to be followed with likely diuresis per cards.

## 2015-07-22 NOTE — Assessment & Plan Note (Addendum)
Poor control. Pt with appointment with cardiology  Hours. Will await recommendations.

## 2015-07-23 ENCOUNTER — Ambulatory Visit (HOSPITAL_COMMUNITY)
Admission: RE | Admit: 2015-07-23 | Discharge: 2015-07-23 | Disposition: A | Discharge status: Home / Self Care | Payer: Medicare HMO | Source: Ambulatory Visit | Attending: Internal Medicine | Admitting: Internal Medicine

## 2015-07-23 VITALS — BP 90/58 | HR 78 | Wt 167.2 lb

## 2015-07-23 DIAGNOSIS — D509 Iron deficiency anemia, unspecified: Secondary | ICD-10-CM | POA: Insufficient documentation

## 2015-07-23 DIAGNOSIS — Z951 Presence of aortocoronary bypass graft: Secondary | ICD-10-CM | POA: Diagnosis not present

## 2015-07-23 DIAGNOSIS — Z8249 Family history of ischemic heart disease and other diseases of the circulatory system: Secondary | ICD-10-CM | POA: Diagnosis not present

## 2015-07-23 DIAGNOSIS — E785 Hyperlipidemia, unspecified: Secondary | ICD-10-CM | POA: Diagnosis not present

## 2015-07-23 DIAGNOSIS — I255 Ischemic cardiomyopathy: Secondary | ICD-10-CM | POA: Diagnosis not present

## 2015-07-23 DIAGNOSIS — I5022 Chronic systolic (congestive) heart failure: Secondary | ICD-10-CM | POA: Diagnosis not present

## 2015-07-23 DIAGNOSIS — M109 Gout, unspecified: Secondary | ICD-10-CM | POA: Insufficient documentation

## 2015-07-23 DIAGNOSIS — Z7982 Long term (current) use of aspirin: Secondary | ICD-10-CM | POA: Diagnosis not present

## 2015-07-23 DIAGNOSIS — R188 Other ascites: Secondary | ICD-10-CM | POA: Diagnosis not present

## 2015-07-23 DIAGNOSIS — I2583 Coronary atherosclerosis due to lipid rich plaque: Secondary | ICD-10-CM

## 2015-07-23 DIAGNOSIS — Z954 Presence of other heart-valve replacement: Secondary | ICD-10-CM | POA: Insufficient documentation

## 2015-07-23 DIAGNOSIS — R140 Abdominal distension (gaseous): Secondary | ICD-10-CM | POA: Diagnosis not present

## 2015-07-23 DIAGNOSIS — N183 Chronic kidney disease, stage 3 unspecified: Secondary | ICD-10-CM

## 2015-07-23 DIAGNOSIS — I058 Other rheumatic mitral valve diseases: Secondary | ICD-10-CM | POA: Diagnosis not present

## 2015-07-23 DIAGNOSIS — Z87891 Personal history of nicotine dependence: Secondary | ICD-10-CM | POA: Diagnosis not present

## 2015-07-23 DIAGNOSIS — I2510 Atherosclerotic heart disease of native coronary artery without angina pectoris: Secondary | ICD-10-CM | POA: Diagnosis not present

## 2015-07-23 DIAGNOSIS — N189 Chronic kidney disease, unspecified: Secondary | ICD-10-CM

## 2015-07-23 DIAGNOSIS — Z95810 Presence of automatic (implantable) cardiac defibrillator: Secondary | ICD-10-CM

## 2015-07-23 DIAGNOSIS — Z881 Allergy status to other antibiotic agents status: Secondary | ICD-10-CM | POA: Diagnosis not present

## 2015-07-23 DIAGNOSIS — I5023 Acute on chronic systolic (congestive) heart failure: Secondary | ICD-10-CM | POA: Insufficient documentation

## 2015-07-23 DIAGNOSIS — Z808 Family history of malignant neoplasm of other organs or systems: Secondary | ICD-10-CM | POA: Insufficient documentation

## 2015-07-23 DIAGNOSIS — F1010 Alcohol abuse, uncomplicated: Secondary | ICD-10-CM | POA: Diagnosis not present

## 2015-07-23 DIAGNOSIS — R0683 Snoring: Secondary | ICD-10-CM | POA: Insufficient documentation

## 2015-07-23 DIAGNOSIS — I252 Old myocardial infarction: Secondary | ICD-10-CM | POA: Diagnosis not present

## 2015-07-23 DIAGNOSIS — R011 Cardiac murmur, unspecified: Secondary | ICD-10-CM | POA: Insufficient documentation

## 2015-07-23 DIAGNOSIS — N179 Acute kidney failure, unspecified: Secondary | ICD-10-CM

## 2015-07-23 DIAGNOSIS — I493 Ventricular premature depolarization: Secondary | ICD-10-CM | POA: Insufficient documentation

## 2015-07-23 DIAGNOSIS — K7460 Unspecified cirrhosis of liver: Secondary | ICD-10-CM | POA: Insufficient documentation

## 2015-07-23 DIAGNOSIS — M1990 Unspecified osteoarthritis, unspecified site: Secondary | ICD-10-CM | POA: Insufficient documentation

## 2015-07-23 DIAGNOSIS — Z8701 Personal history of pneumonia (recurrent): Secondary | ICD-10-CM | POA: Insufficient documentation

## 2015-07-23 DIAGNOSIS — R55 Syncope and collapse: Secondary | ICD-10-CM | POA: Insufficient documentation

## 2015-07-23 LAB — BASIC METABOLIC PANEL
Anion gap: 9 (ref 5–15)
BUN: 54 mg/dL — ABNORMAL HIGH (ref 6–20)
CALCIUM: 8.7 mg/dL — AB (ref 8.9–10.3)
CO2: 27 mmol/L (ref 22–32)
CREATININE: 2.29 mg/dL — AB (ref 0.61–1.24)
Chloride: 99 mmol/L — ABNORMAL LOW (ref 101–111)
GFR calc non Af Amer: 26 mL/min — ABNORMAL LOW (ref >60)
GFR, EST AFRICAN AMERICAN: 31 mL/min — AB (ref >60)
GLUCOSE: 96 mg/dL (ref 65–99)
Potassium: 4.3 mmol/L (ref 3.5–5.1)
Sodium: 135 mmol/L (ref 135–145)

## 2015-07-23 LAB — BRAIN NATRIURETIC PEPTIDE: B Natriuretic Peptide: 2675.1 pg/mL — ABNORMAL HIGH (ref 0.0–100.0)

## 2015-07-23 MED ORDER — ATORVASTATIN CALCIUM 20 MG PO TABS: 20.0000 mg | ORAL_TABLET | Freq: Every day | ORAL | Status: DC

## 2015-07-23 MED ORDER — ALPRAZOLAM 1 MG PO TABS: 1.0000 mg | ORAL_TABLET | Freq: Every day | ORAL | Status: DC

## 2015-07-23 MED ORDER — AMIODARONE HCL 200 MG PO TABS: 200.0000 mg | ORAL_TABLET | Freq: Every day | ORAL | Status: DC

## 2015-07-23 NOTE — Patient Instructions (Addendum)
Labs today  Your physician recommends that you schedule a follow-up appointment in: 2 weeks  Do the following things EVERYDAY: 1) Weigh yourself in the morning before breakfast. Write it down and keep it in a log. 2) Take your medicines as prescribed 3) Eat low salt foods-Limit salt (sodium) to 2000 mg per day.  4) Stay as active as you can everyday 5) Limit all fluids for the day to less than 2 liters  Your provider has scheduled you for a paracentesis Friday June 28th 2017 at 10:00am please arrive at short stay admissions of [facility] at 9:45am the day of procedure Paracentesis Paracentesis is a procedure to remove excess fluid (ascites) from the belly (abdomen). Ascites can result from certain conditions, such as infection, inflammation, abdominal injury, heart failure, chronic scarring of the liver (cirrhosis), or cancer. Ascites is removed using a needle that is inserted through the skin and tissue into the abdomen. This procedure may be done:  To determine the cause of the ascites.  To relieve symptoms that are caused by the ascites, such as pain or shortness of breath.  To see if there is bleeding after an abdominal injury.
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
## 2015-07-23 NOTE — Progress Notes (Signed)
Advanced Heart Failure Medication Review by a Pharmacist  Does the patient  feel that his/her medications are working for him/her?  yes  Has the patient been experiencing any side effects to the medications prescribed?  no  Does the patient measure his/her own blood pressure or blood glucose at home?  Not assessed   Does the patient have any problems obtaining medications due to transportation or finances?   no  Understanding of regimen: fair Understanding of indications: fair Potential of compliance: good Patient understands to avoid NSAIDs. Patient understands to avoid decongestants.   Pharmacist comments: [name] Roberts is a pleasant 26 yom presenting to clinic with his daughter for a follow-up visit. HE denies any current medication-related side effects and denies any dizziness. He does state that he is carrying more fluid in his abdomen area lately and that he gets very short of breath even when walking short distances. He did not have any other medication-related questions or concerns at this time.   Early Steel C. Marvis Moeller, PharmD Pharmacy Resident  Pager: [phone] 07/23/2015 11:15 AM    Time with patient: 8 min  Preparation and documentation time: 2 min  Total time: 10 min

## 2015-07-24 ENCOUNTER — Telehealth (HOSPITAL_COMMUNITY): Payer: Self-pay | Admitting: Cardiology

## 2015-07-24 DIAGNOSIS — I509 Heart failure, unspecified: Secondary | ICD-10-CM

## 2015-07-24 MED ORDER — TORSEMIDE 20 MG PO TABS: 80.0000 mg | ORAL_TABLET | Freq: Every day | ORAL | Status: DC

## 2015-07-24 NOTE — Telephone Encounter (Signed)
Patient aware.

## 2015-07-24 NOTE — Telephone Encounter (Signed)
-----   Message from Graciella Freer, PA-C sent at 07/23/2015  3:12 PM EDT ----- Increase torsemide to 80 mg daily. Please tell him his kidneys continue to improve.   Has 2 week follow up.   [name] [address]" [place], PA-C 07/23/2015 3:09 PM

## 2015-07-25 ENCOUNTER — Ambulatory Visit (HOSPITAL_COMMUNITY)
Admission: RE | Admit: 2015-07-25 | Discharge: 2015-07-25 | Disposition: A | Discharge status: Home / Self Care | Payer: Medicare HMO | Source: Ambulatory Visit | Attending: Student | Admitting: Student

## 2015-07-25 DIAGNOSIS — R188 Other ascites: Secondary | ICD-10-CM | POA: Insufficient documentation

## 2015-07-25 MED ORDER — LIDOCAINE HCL 1 % IJ SOLN
INTRAMUSCULAR | Status: AC
  Filled 2015-07-25: qty 20

## 2015-07-25 NOTE — Procedures (Signed)
Ultrasound-guided therapeutic paracentesis performed yielding 5 liters of amber colored fluid. No immediate complications.  [name] E 11:10 AM 07/25/2015

## 2015-07-28 ENCOUNTER — Other Ambulatory Visit: Payer: Self-pay

## 2015-07-28 DIAGNOSIS — I5022 Chronic systolic (congestive) heart failure: Secondary | ICD-10-CM

## 2015-07-28 NOTE — Telephone Encounter (Signed)
Pt requests refill on Alprazolam.  Pt states that the Coreg he takes causes him to have racing thoughts and he uses the alprazolam QHS to calm these thoughts so that he can sleep.  Tiajuana Amass, CMA

## 2015-07-30 ENCOUNTER — Encounter: Payer: Self-pay | Admitting: Internal Medicine

## 2015-07-30 ENCOUNTER — Ambulatory Visit (INDEPENDENT_AMBULATORY_CARE_PROVIDER_SITE_OTHER): Payer: Medicare HMO | Admitting: Internal Medicine

## 2015-07-30 VITALS — BP 90/62 | HR 64 | Ht 68.0 in | Wt 166.2 lb

## 2015-07-30 DIAGNOSIS — I5022 Chronic systolic (congestive) heart failure: Secondary | ICD-10-CM

## 2015-07-30 LAB — CUP PACEART INCLINIC DEVICE CHECK
Battery Remaining Longevity: 126 mo
Date Time Interrogation Session: 20170705113923
HIGH POWER IMPEDANCE MEASURED VALUE: 49 Ohm
Implantable Lead Implant Date: 20150709
Lead Channel Pacing Threshold Amplitude: 0.75 V
Lead Channel Pacing Threshold Pulse Width: 0.4 ms
Lead Channel Sensing Intrinsic Amplitude: 8.125 mV
MDC IDC LEAD LOCATION: 753860
MDC IDC MSMT BATTERY VOLTAGE: 3.02 V
MDC IDC MSMT LEADCHNL RV IMPEDANCE VALUE: 304 Ohm
MDC IDC MSMT LEADCHNL RV IMPEDANCE VALUE: 399 Ohm
MDC IDC SET LEADCHNL RV PACING AMPLITUDE: 2.5 V
MDC IDC SET LEADCHNL RV PACING PULSEWIDTH: 0.4 ms
MDC IDC SET LEADCHNL RV SENSING SENSITIVITY: 0.3 mV
MDC IDC STAT BRADY RV PERCENT PACED: 0.31 %

## 2015-07-30 NOTE — Telephone Encounter (Signed)
Advised pt that Dr Sharee Holster must see him in the office before she can authorize refills on Alprazolam.  Pt expressed understanding and was transferred to front desk to schedule appointment.  Tiajuana Amass, CMA

## 2015-07-30 NOTE — Patient Instructions (Addendum)
Medication Instructions:  Your physician recommends that you continue on your current medications as directed. Please refer to the Current Medication list given to you today.  Labwork: None  ordered  Testing/Procedures: None ordered  Follow-Up: Remote monitoring is used to monitor your Pacemaker of ICD from home. This monitoring reduces the number of office visits required to check your device to one time per year. It allows [place] to keep an eye on the functioning of your device to ensure it is working properly. You are scheduled for a device check from home on 10/29/2015. You may send your transmission at any time that day. If you have a wireless device, the transmission will be sent automatically. After your physician reviews your transmission, you will receive a postcard with your next transmission date.  Your physician wants you to follow-up in: 1 year with Dr. Ladona Ridgel. You will receive a reminder letter in the mail two months in advance. If you don't receive a letter, please call our office to schedule the follow-up appointment.   If you need a refill on your cardiac medications before your next appointment, please call your pharmacy.  Thank you for choosing [facility]!!     Any Other Special Instructions Will Be Listed Below (If Applicable).
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
## 2015-07-31 ENCOUNTER — Encounter: Payer: Self-pay | Admitting: Family Medicine

## 2015-07-31 ENCOUNTER — Ambulatory Visit (INDEPENDENT_AMBULATORY_CARE_PROVIDER_SITE_OTHER): Payer: Medicare HMO | Admitting: Family Medicine

## 2015-07-31 VITALS — BP 101/60 | HR 67 | Ht 68.0 in | Wt 167.7 lb

## 2015-07-31 DIAGNOSIS — I5022 Chronic systolic (congestive) heart failure: Secondary | ICD-10-CM | POA: Diagnosis not present

## 2015-07-31 DIAGNOSIS — R1310 Dysphagia, unspecified: Secondary | ICD-10-CM

## 2015-07-31 DIAGNOSIS — G479 Sleep disorder, unspecified: Secondary | ICD-10-CM | POA: Diagnosis not present

## 2015-07-31 MED ORDER — FLUTICASONE PROPIONATE 50 MCG/ACT NA SUSP: 2.0000 | Freq: Every day | NASAL | Status: AC

## 2015-07-31 MED ORDER — ALPRAZOLAM 1 MG PO TABS: ORAL_TABLET | ORAL | Status: DC

## 2015-07-31 NOTE — Patient Instructions (Addendum)
Okay to use [name] Roberts med sinus rinse or nasal saline solution daily  Try to avoid any foods or drinks that make his symptoms worse. Must chew food until it is like mesh and eat smaller bites  I recommend patient try melatonin and take only one half tablet of alprazolam nightly see how he does and can always go up to 10 mg of melatonin nightly.   Even after discussion of detrimental effects of use in of alprazolam in elderly patient still desires despite my recommendations to not use   Dysphagia Swallowing problems (dysphagia) occur when solids and liquids seem to stick in your throat on the way down to your stomach, or the food takes longer to get to the stomach. Other symptoms include regurgitating food, noises coming from the throat, chest discomfort with swallowing, and a feeling of fullness or the feeling of something being stuck in your throat when swallowing. When blockage in your throat is complete, it may be associated with drooling. CAUSES  Problems with swallowing may occur because of problems with the muscles. The food cannot be propelled in the usual manner into your stomach. You may have ulcers, scar tissue, or inflammation in the tube down which food travels from your mouth to your stomach (esophagus), which blocks food from passing normally into the stomach. Causes of inflammation include:  Acid reflux from your stomach into your esophagus.  Infection.  Radiation treatment for cancer.  Medicines taken without enough fluids to wash them down into your stomach. You may have nerve problems that prevent signals from being sent to the muscles of your esophagus to contract and move your food down to your stomach. Globus pharyngeus is a relatively common problem in which there is a sense of an obstruction or difficulty in swallowing, without any physical abnormalities of the swallowing passages being found. This problem usually improves over time with reassurance and testing to rule out  other causes. DIAGNOSIS Dysphagia can be diagnosed and its cause can be determined by tests in which you swallow a white substance that helps illuminate the inside of your throat (contrast medium) while X-rays are taken. Sometimes a flexible telescope that is inserted down your throat (endoscopy) to look at your esophagus and stomach is used. TREATMENT   If the dysphagia is caused by acid reflux or infection, medicines may be used.  If the dysphagia is caused by problems with your swallowing muscles, swallowing therapy may be used to help you strengthen your swallowing muscles.  If the dysphagia is caused by a blockage or mass, procedures to remove the blockage may be done. HOME CARE INSTRUCTIONS  Try to eat soft food that is easier to swallow and check your weight on a daily basis to be sure that it is not decreasing.  Be sure to drink liquids when sitting upright (not lying down). SEEK MEDICAL CARE IF:  You are losing weight because you are unable to swallow.  You are coughing when you drink liquids (aspiration).  You are coughing up partially digested food. SEEK IMMEDIATE MEDICAL CARE IF:  You are unable to swallow your own saliva .  You are having shortness of breath or a fever, or both.  You have a hoarse voice along with difficulty swallowing. MAKE SURE YOU:  Understand these instructions.  Will watch your condition.  Will get help right away if you are not doing well or get worse.   This information is not intended to replace advice given to you by your health care  provider. Make sure you discuss any questions you have with your health care provider.   Document Released: 01/09/2000 Document Revised: 02/01/2014 Document Reviewed: 06/30/2012 Elsevier Interactive Patient Education [employer]

## 2015-07-31 NOTE — Progress Notes (Signed)
Subjective:    Chief Complaint  Patient presents with  . Dysphagia    HPI: [name] Roberts is a 75 y.o. male who presents to [facility] at [facility] today with wife b/c his cards doc rec he come here to discuss his sx of dysphagia.     When eating- feels like food gets stuck.  Going on for yrs and yrs- since 75's- but last month or so he's noticed it more.   Coughing up food when feels choked.  Not every time he eats or drinks- but most of the time.  Food and liquid.   No FB sensation.  Never has sx when not eating or drinking.  No breathing diff, no pain on swallowing, denies CP/ heartburn sx.  Does c/o PNDrip sx as well.   Takes alprazalam for sleep only.  If he doesn't use it- can't sleep at all and if he takes one- he still gets up once or twice per evening.   Requesting Refill.      Past Medical History  Diagnosis Date  . Chronic systolic heart failure (HCC)   . Cardiomyopathy, ischemic   . CAD (coronary artery disease)   . HLD (hyperlipidemia)   . Gout   . Heart murmur   . CHF (congestive heart failure) (HCC)   . Myocardial infarction (HCC) 1981  . Pneumonia 12/20206  . History of blood transfusion     "S/P OHS"  . Iron deficiency anemia     "S/P OHS"  . Arthritis     "right thumb; posterior neck" (08/02/2013)     Past Surgical History  Procedure Laterality Date  . Appendectomy    . Cardiac defibrillator placement  2007    Dr. Ladona Ridgel  . Icd generator change  08/02/2013    "w/new leads"  . Band hemorrhoidectomy    . Cardiac catheterization  191; 2007  . Aortic valve repair  2007    "put an O ring in it"  . Coronary artery bypass graft  2007    Dr. Barry Dienes  . Pilonidal cyst excision  1961  . Implantable cardioverter defibrillator (icd) generator change N/A 08/02/2013    Procedure: ICD GENERATOR CHANGE;  Surgeon: Marinus Maw, MD;  Location: [facility] CATH LAB;  Service: Cardiovascular;  Laterality: N/A;  . Cardiac catheterization N/A 06/26/2015      Procedure: Right Heart Cath;  Surgeon: Laurey Morale, MD;  Location: [facility] INVASIVE CV LAB;  Service: Cardiovascular;  Laterality: N/A;     Family History  Problem Relation Age of Onset  . Cancer Mother     brain cancer  . Heart disease Father     CAD AND CHF     History  Drug Use No    Comment: "a little speed when I was much younger"  ,  History  Alcohol Use No    Comment: 08/02/2013 "beer:  most weeks none; some weeks 2-3 glasses; same w/wine"  ,  History  Smoking status  . Former Smoker -- 2.50 packs/day for 56 years  . Types: Cigarettes  . Quit date: 01/18/2005  Smokeless tobacco  . Never Used  ,  History  Sexual Activity  . Sexual Activity: No      Current Outpatient Prescriptions on File Prior to Visit  Medication Sig Dispense Refill  . allopurinol (ZYLOPRIM) 100 MG tablet TAKE 1 TABLET BY MOUTH DAILY. 90 tablet 0  . amiodarone (PACERONE) 200 MG tablet Take 1  tablet (200 mg total) by mouth daily. 30 tablet 3  . aspirin EC 81 MG tablet Take 81 mg by mouth at bedtime.     Marland Kitchen atorvastatin (LIPITOR) 20 MG tablet Take 1 tablet (20 mg total) by mouth daily at 6 PM. 30 tablet 3  . carvedilol (COREG) 3.125 MG tablet Take 1 tablet (3.125 mg total) by mouth 2 (two) times daily with a meal. 60 tablet 6  . niacin (NIASPAN) 500 MG CR tablet Take 1 tablet (500 mg total) by mouth at bedtime. 90 tablet 3  . polyvinyl alcohol-povidone (REFRESH) 1.4-0.6 % ophthalmic solution Place 1 drop into the left eye daily as needed (for irritation).     . potassium chloride (K-DUR) 10 MEQ tablet Take 10 mEq by mouth daily. Reported on 07/11/2015    . torsemide (DEMADEX) 20 MG tablet Take 4 tablets (80 mg total) by mouth daily. Reported on 07/11/2015 120 tablet 2   No current facility-administered medications on file prior to visit.    Allergies  Allergen Reactions  . Erythromycin Base Swelling    Watery red eyes and red streaks down the face      Review of Systems:  ( Completed via  adult medical history intake form today ) General:  Denies fever, chills, appetite changes, unexplained weight loss.  Respiratory: Denies SOB, DOE, cough, wheezing.  Cardiovascular: Denies chest pain, palpitations.  Gastrointestinal: Denies nausea, vomiting, diarrhea, abdominal pain.  Genitourinary: Denies dysuria, increased frequency, flank pain. Endocrine: Denies hot or cold intolerance, polyuria, polydipsia. Musculoskeletal: Denies myalgias, back pain, joint swelling, arthralgias, gait problems.  Skin: Denies pallor, rash, suspicious lesions.  Neurological: Denies dizziness, seizures, syncope, unexplained weakness, lightheadedness, numbness and headaches.  Psychiatric/Behavioral: Denies mood changes, suicidal or homicidal ideations, hallucinations, sleep disturbances.    Objective:    Blood pressure 101/60, pulse 67, height  (1.727 m), weight 167 lb 11.2 oz (76.068 kg). Body mass index is 25.5 kg/(m^2). General: Well Developed, well nourished, and in no acute distress.  HEENT: Normocephalic, atraumatic, pupils equal round reactive to light, neck supple, no masses, no TM, Nares- erythema and edematous Skin: Warm and dry, cap RF less 2 sec Cardiac: Regular rate and rhythm, S1, S2 WNL's, no murmurs rubs or gallops Respiratory: ECTA B/L, Not using accessory muscles, speaking in full sentences. Abd:  + BS * 4, no G/R/R, no OM NeuroM-Sk: Ambulates w/o assistance, moves ext * 4 w/o difficulty, sensation grossly intact.  Psych: A and O *3     Impression and Recommendations:    The patient was counselled, risk factors were discussed, anticipatory guidance given.   Difficulty sleeping Long discussion re: benzo's and how bad they can be in elderly - but after I rec he stop it and try something else, he and wife understand potential risks but still desire med RF.  They understand it inc risk falls and even death in elderly...Marland KitchenMarland KitchenMarland Kitchen   Dysphagia- diff swallowing solids and liquids Likely  oropharyngeal in nature and hopefully rings or web.  Will obtain modified barium swallow, possible endoscopy and esophageal manometry in future depending on results.    Orders Placed This Encounter  Procedures  . SLP modified barium swallow   Meds ordered this encounter  Medications  . fluticasone (FLONASE) 50 MCG/ACT nasal spray    Sig: Place 2 sprays into both nostrils daily.    Dispense:  16 g    Refill:  6  . ALPRAZolam (XANAX) 1 MG tablet    Sig: Take 1/2 to  1 tab nightly before bed.    Dispense:  30 tablet    Refill:  0    Please see AVS handed out to patient at the end of our visit for further patient instructions/ counseling done pertaining to today's office visit.  Gross side effects, risk and benefits, and alternatives of medications discussed with patient.  Patient is aware that all medications have potential side effects and we are unable to predict every sideeffect or drug-drug interaction that may occur.  Expresses verbal understanding and consents to current therapy plan and treatment regiment.  Note: This document was prepared using Dragon voice recognition software and may include unintentional dictation errors.

## 2015-08-01 ENCOUNTER — Other Ambulatory Visit (HOSPITAL_COMMUNITY): Payer: Self-pay | Admitting: Family Medicine

## 2015-08-01 DIAGNOSIS — R1310 Dysphagia, unspecified: Secondary | ICD-10-CM

## 2015-08-02 DIAGNOSIS — R1310 Dysphagia, unspecified: Secondary | ICD-10-CM | POA: Insufficient documentation

## 2015-08-02 DIAGNOSIS — G479 Sleep disorder, unspecified: Secondary | ICD-10-CM | POA: Insufficient documentation

## 2015-08-02 DIAGNOSIS — I219 Acute myocardial infarction, unspecified: Secondary | ICD-10-CM | POA: Insufficient documentation

## 2015-08-02 NOTE — Assessment & Plan Note (Addendum)
Likely oropharyngeal in nature and hopefully rings or web.  Will obtain modified barium swallow, possible endoscopy and esophageal manometry in future depending on results.   Since pt with c/o PND, trial flonase as well.  Use daily.  Denies GERD sx or other rhinosinusitis sx.

## 2015-08-02 NOTE — Assessment & Plan Note (Signed)
Long discussion re: benzo's and how bad they can be in elderly - but after I rec he stop it and try something else, he and wife understand potential risks but still desire med RF.  They understand it inc risk falls and even death in elderly....Marland KitchenMarland Kitchen

## 2015-08-05 ENCOUNTER — Ambulatory Visit (HOSPITAL_COMMUNITY)
Admission: RE | Admit: 2015-08-05 | Discharge: 2015-08-05 | Disposition: A | Discharge status: Home / Self Care | Payer: Medicare HMO | Source: Ambulatory Visit | Attending: Cardiology | Admitting: Cardiology

## 2015-08-05 VITALS — BP 102/66 | HR 78 | Wt 172.2 lb

## 2015-08-05 DIAGNOSIS — R946 Abnormal results of thyroid function studies: Secondary | ICD-10-CM | POA: Diagnosis not present

## 2015-08-05 DIAGNOSIS — Z95810 Presence of automatic (implantable) cardiac defibrillator: Secondary | ICD-10-CM | POA: Diagnosis not present

## 2015-08-05 DIAGNOSIS — I5023 Acute on chronic systolic (congestive) heart failure: Secondary | ICD-10-CM | POA: Diagnosis present

## 2015-08-05 DIAGNOSIS — Z951 Presence of aortocoronary bypass graft: Secondary | ICD-10-CM | POA: Diagnosis not present

## 2015-08-05 DIAGNOSIS — I519 Heart disease, unspecified: Secondary | ICD-10-CM

## 2015-08-05 DIAGNOSIS — I255 Ischemic cardiomyopathy: Secondary | ICD-10-CM | POA: Diagnosis not present

## 2015-08-05 DIAGNOSIS — R188 Other ascites: Secondary | ICD-10-CM | POA: Insufficient documentation

## 2015-08-05 DIAGNOSIS — I493 Ventricular premature depolarization: Secondary | ICD-10-CM | POA: Insufficient documentation

## 2015-08-05 DIAGNOSIS — M1990 Unspecified osteoarthritis, unspecified site: Secondary | ICD-10-CM | POA: Diagnosis not present

## 2015-08-05 DIAGNOSIS — N179 Acute kidney failure, unspecified: Secondary | ICD-10-CM | POA: Diagnosis not present

## 2015-08-05 DIAGNOSIS — R0683 Snoring: Secondary | ICD-10-CM | POA: Diagnosis not present

## 2015-08-05 DIAGNOSIS — E785 Hyperlipidemia, unspecified: Secondary | ICD-10-CM | POA: Diagnosis not present

## 2015-08-05 DIAGNOSIS — M109 Gout, unspecified: Secondary | ICD-10-CM | POA: Insufficient documentation

## 2015-08-05 DIAGNOSIS — I2510 Atherosclerotic heart disease of native coronary artery without angina pectoris: Secondary | ICD-10-CM | POA: Diagnosis not present

## 2015-08-05 DIAGNOSIS — R55 Syncope and collapse: Secondary | ICD-10-CM | POA: Insufficient documentation

## 2015-08-05 DIAGNOSIS — K7460 Unspecified cirrhosis of liver: Secondary | ICD-10-CM | POA: Diagnosis not present

## 2015-08-05 DIAGNOSIS — Z87891 Personal history of nicotine dependence: Secondary | ICD-10-CM | POA: Insufficient documentation

## 2015-08-05 DIAGNOSIS — I252 Old myocardial infarction: Secondary | ICD-10-CM | POA: Diagnosis not present

## 2015-08-05 DIAGNOSIS — E039 Hypothyroidism, unspecified: Secondary | ICD-10-CM

## 2015-08-05 DIAGNOSIS — Z7982 Long term (current) use of aspirin: Secondary | ICD-10-CM | POA: Insufficient documentation

## 2015-08-05 DIAGNOSIS — N183 Chronic kidney disease, stage 3 unspecified: Secondary | ICD-10-CM

## 2015-08-05 DIAGNOSIS — I5022 Chronic systolic (congestive) heart failure: Secondary | ICD-10-CM

## 2015-08-05 DIAGNOSIS — D509 Iron deficiency anemia, unspecified: Secondary | ICD-10-CM | POA: Diagnosis not present

## 2015-08-05 DIAGNOSIS — E038 Other specified hypothyroidism: Secondary | ICD-10-CM

## 2015-08-05 DIAGNOSIS — R140 Abdominal distension (gaseous): Secondary | ICD-10-CM

## 2015-08-05 LAB — BASIC METABOLIC PANEL
ANION GAP: 9 (ref 5–15)
BUN: 45 mg/dL — ABNORMAL HIGH (ref 6–20)
CALCIUM: 8.9 mg/dL (ref 8.9–10.3)
CO2: 26 mmol/L (ref 22–32)
Chloride: 99 mmol/L — ABNORMAL LOW (ref 101–111)
Creatinine, Ser: 2.34 mg/dL — ABNORMAL HIGH (ref 0.61–1.24)
GFR, EST AFRICAN AMERICAN: 30 mL/min — AB (ref >60)
GFR, EST NON AFRICAN AMERICAN: 26 mL/min — AB (ref >60)
GLUCOSE: 88 mg/dL (ref 65–99)
POTASSIUM: 3.9 mmol/L (ref 3.5–5.1)
Sodium: 134 mmol/L — ABNORMAL LOW (ref 135–145)

## 2015-08-05 MED ORDER — POTASSIUM CHLORIDE ER 10 MEQ PO TBCR: 10.0000 meq | EXTENDED_RELEASE_TABLET | Freq: Every day | ORAL | Status: DC

## 2015-08-05 MED ORDER — METOLAZONE 2.5 MG PO TABS: 2.5000 mg | ORAL_TABLET | ORAL | Status: DC

## 2015-08-05 NOTE — Progress Notes (Signed)
Advanced Heart Failure Medication Review by a Pharmacist  Does the patient  feel that his/her medications are working for him/her?  yes  Has the patient been experiencing any side effects to the medications prescribed?  no  Does the patient measure his/her own blood pressure or blood glucose at home?  yes   Does the patient have any problems obtaining medications due to transportation or finances?   no  Understanding of regimen: good Understanding of indications: good Potential of compliance: good Patient understands to avoid NSAIDs. Patient understands to avoid decongestants.  Issues to address at subsequent visits: None   Pharmacist comments:  [name] Roberts is a pleasant 75 yo M presenting with his daughter and without a medication list. Daughter manages his medications and reports good compliance. They did not have any specific medication-related questions or concerns for me at this time.   [name] Roberts. Bonnye Fava, PharmD, BCPS, CPP Clinical Pharmacist Pager: [phone] Phone: [phone] 08/05/2015 12:25 PM      Time with patient: 10 minutes Preparation and documentation time:  2 minutes Total time: 12 minutes

## 2015-08-05 NOTE — Patient Instructions (Signed)
START Metolazone 2.5 mg, one tab every Wednesday ADD an additional 20 meq of potassium with every dose of metolazone   Labs today and again Friday You have been referred to have a paracentesis next week  Your physician recommends that you schedule a follow-up appointment in: 2 weeks  Do the following things EVERYDAY: 1) Weigh yourself in the morning before breakfast. Write it down and keep it in a log. 2) Take your medicines as prescribed 3) Eat low salt foods-Limit salt (sodium) to 2000 mg per day.  4) Stay as active as you can everyday 5) Limit all fluids for the day to less than 2 liters 6)

## 2015-08-05 NOTE — Addendum Note (Signed)
Encounter addended by: Theresia Bough, CMA on: 08/05/2015  1:15 PM<BR>     Documentation filed: [employer], Orders
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
## 2015-08-07 ENCOUNTER — Ambulatory Visit (HOSPITAL_COMMUNITY)
Admission: RE | Admit: 2015-08-07 | Discharge: 2015-08-07 | Disposition: A | Discharge status: Home / Self Care | Payer: Medicare HMO | Source: Ambulatory Visit | Attending: Family Medicine | Admitting: Family Medicine

## 2015-08-07 DIAGNOSIS — R1310 Dysphagia, unspecified: Secondary | ICD-10-CM

## 2015-08-07 DIAGNOSIS — R938 Abnormal findings on diagnostic imaging of other specified body structures: Secondary | ICD-10-CM | POA: Diagnosis not present

## 2015-08-07 NOTE — Progress Notes (Signed)
    MBSS complete. Full report located under chart review in imaging section.   CHL IP CLINICAL IMPRESSIONS 08/07/2015  Therapy Diagnosis Mild pharyngeal phase dysphagia;Moderate pharyngeal phase dysphagia  Clinical Impression Pt exhibited a mild-moderate motor based pharyngeal dysphagia marked by decreased laryngeal elevation and closure resulting in penetration and silent aspiration with thin barium and silent penetration with nectar. Pt's pharyngeal space appears mildly greater possibly contributing to decreased protection. Therapeutic chin tuck was not effective to prevent penetrates/aspirates. Chin tuck with nectar consistency mitigated risk and prevented penetration in 80% of trials. Verbal cues for second swallow to reduce vallecular residue and volitional cough were effective. Recommend nectar thick liquids and regular texture, chin tuck with solids and liquids, pills whole in applesauce, no straws, swallow twice and hard cough after every 2-3 bites sips. Pt may consume thin (unthickened) water in between meals (not during meals or meds), clean oral cavity prior to thin water. Educated pt that dysphagia may become problematic if he becomes ill with decreased reserve and inability to compensate for dysphagia.     Impact on safety and function Moderate aspiration risk    Royce Macadamia M.Ed [employer] [phone]

## 2015-08-08 ENCOUNTER — Other Ambulatory Visit: Payer: Self-pay | Admitting: Family Medicine

## 2015-08-08 ENCOUNTER — Telehealth (HOSPITAL_COMMUNITY): Payer: Self-pay

## 2015-08-08 ENCOUNTER — Ambulatory Visit (HOSPITAL_COMMUNITY)
Admission: RE | Admit: 2015-08-08 | Discharge: 2015-08-08 | Disposition: A | Discharge status: Home / Self Care | Payer: Medicare HMO | Source: Ambulatory Visit | Attending: Cardiology | Admitting: Cardiology

## 2015-08-08 ENCOUNTER — Telehealth: Payer: Self-pay | Admitting: Family Medicine

## 2015-08-08 DIAGNOSIS — E039 Hypothyroidism, unspecified: Secondary | ICD-10-CM | POA: Insufficient documentation

## 2015-08-08 DIAGNOSIS — I519 Heart disease, unspecified: Secondary | ICD-10-CM | POA: Diagnosis not present

## 2015-08-08 DIAGNOSIS — E038 Other specified hypothyroidism: Secondary | ICD-10-CM

## 2015-08-08 LAB — BASIC METABOLIC PANEL
ANION GAP: 9 (ref 5–15)
BUN: 47 mg/dL — ABNORMAL HIGH (ref 6–20)
CO2: 29 mmol/L (ref 22–32)
Calcium: 8.7 mg/dL — ABNORMAL LOW (ref 8.9–10.3)
Chloride: 97 mmol/L — ABNORMAL LOW (ref 101–111)
Creatinine, Ser: 2.13 mg/dL — ABNORMAL HIGH (ref 0.61–1.24)
GFR calc Af Amer: 33 mL/min — ABNORMAL LOW (ref >60)
GFR, EST NON AFRICAN AMERICAN: 29 mL/min — AB (ref >60)
GLUCOSE: 204 mg/dL — AB (ref 65–99)
POTASSIUM: 2.6 mmol/L — AB (ref 3.5–5.1)
SODIUM: 135 mmol/L (ref 135–145)

## 2015-08-08 LAB — T4, FREE: FREE T4: 0.52 ng/dL — AB (ref 0.61–1.12)

## 2015-08-08 LAB — TSH: TSH: 32.988 u[IU]/mL — AB (ref 0.350–4.500)

## 2015-08-08 MED ORDER — LEVOTHYROXINE SODIUM 50 MCG PO TABS: 50.0000 ug | ORAL_TABLET | Freq: Every day | ORAL | Status: DC

## 2015-08-08 MED ORDER — POTASSIUM CHLORIDE ER 10 MEQ PO TBCR: 20.0000 meq | EXTENDED_RELEASE_TABLET | Freq: Two times a day (BID) | ORAL | Status: DC

## 2015-08-08 NOTE — Assessment & Plan Note (Signed)
We'll start Synthroid at 50 g. Told patient to return to clinic in 6-8 weeks for recheck of labs.

## 2015-08-08 NOTE — Telephone Encounter (Signed)
Notes Recorded by Chyrl Civatte, RN on 08/08/2015 at 1:52 PM Instructions reviewed with wife, Rx updated to preferred pharmacy electronically. Repeat BMET scheduled for Monday. Notes Recorded by Chyrl Civatte, RN on 08/08/2015 at 1:46 PM Left VM with instructions and for patient to return call to verify he received message of med instructions, as well as to schedule lab apt. Notes Recorded by Graciella Freer, PA-C on 08/08/2015 at 12:24 PM Have him take 40 meq K BID today and tomorrow, and then decrease to new regimen of 20 meq BID ( up from 10 meq daily).   Recheck BMET Monday.    [name] [address] [place], PA-C 08/08/2015 12:22 PM

## 2015-08-09 LAB — T3, FREE: T3 FREE: 2.5 pg/mL (ref 2.0–4.4)

## 2015-08-11 ENCOUNTER — Ambulatory Visit (HOSPITAL_COMMUNITY)
Admission: RE | Admit: 2015-08-11 | Discharge: 2015-08-11 | Disposition: A | Discharge status: Home / Self Care | Payer: Medicare HMO | Source: Ambulatory Visit | Attending: Cardiology | Admitting: Cardiology

## 2015-08-11 DIAGNOSIS — I5023 Acute on chronic systolic (congestive) heart failure: Secondary | ICD-10-CM

## 2015-08-11 DIAGNOSIS — R188 Other ascites: Secondary | ICD-10-CM | POA: Insufficient documentation

## 2015-08-11 LAB — BASIC METABOLIC PANEL
ANION GAP: 8 (ref 5–15)
BUN: 50 mg/dL — AB (ref 6–20)
CALCIUM: 8.9 mg/dL (ref 8.9–10.3)
CO2: 29 mmol/L (ref 22–32)
Chloride: 99 mmol/L — ABNORMAL LOW (ref 101–111)
Creatinine, Ser: 2.34 mg/dL — ABNORMAL HIGH (ref 0.61–1.24)
GFR calc Af Amer: 30 mL/min — ABNORMAL LOW (ref >60)
GFR, EST NON AFRICAN AMERICAN: 26 mL/min — AB (ref >60)
GLUCOSE: 144 mg/dL — AB (ref 65–99)
Potassium: 3.4 mmol/L — ABNORMAL LOW (ref 3.5–5.1)
SODIUM: 136 mmol/L (ref 135–145)

## 2015-08-11 NOTE — Progress Notes (Signed)
08/11/2015  Per Clayborne Artist, pt was informed to start thyroid medication and follow up with labs in approximately 7 weeks.  Pt has follow up appointment with Dr. Sharee Holster on 08/27/2015.  Tiajuana Amass, CMA

## 2015-08-12 ENCOUNTER — Telehealth (HOSPITAL_COMMUNITY): Payer: Self-pay | Admitting: Cardiology

## 2015-08-12 ENCOUNTER — Ambulatory Visit (HOSPITAL_COMMUNITY)
Admission: RE | Admit: 2015-08-12 | Discharge: 2015-08-12 | Disposition: A | Discharge status: Home / Self Care | Payer: Medicare HMO | Source: Ambulatory Visit | Attending: Student | Admitting: Student

## 2015-08-12 ENCOUNTER — Encounter: Payer: Self-pay | Admitting: Internal Medicine

## 2015-08-12 DIAGNOSIS — R188 Other ascites: Secondary | ICD-10-CM | POA: Diagnosis not present

## 2015-08-12 MED ORDER — POTASSIUM CHLORIDE ER 10 MEQ PO TBCR: 20.0000 meq | EXTENDED_RELEASE_TABLET | ORAL | Status: DC

## 2015-08-12 NOTE — Telephone Encounter (Signed)
Pt aware and voiced understanding 

## 2015-08-12 NOTE — Procedures (Signed)
Successful US guided paracentesis from right lateral abdomen.  Yielded 5.3 liters of clear dark yellow fluid.  No immediate complications.  Pt tolerated well.   Alvin Rubano S Tocarra Gassen PA-C 08/12/2015 10:50 AM

## 2015-08-12 NOTE — Telephone Encounter (Signed)
-----   Message from Graciella Freer, PA-C sent at 08/11/2015 11:03 AM EDT ----- Renal function relatively stable.   K remains low at 3.4.  Please have him increase to 40 meq q am and 20 meq q pm.  We will recheck at visit next week.     [name] [address] [place], PA-C 08/11/2015 11:03 AM

## 2015-08-15 ENCOUNTER — Telehealth (HOSPITAL_COMMUNITY): Payer: Self-pay

## 2015-08-15 NOTE — Telephone Encounter (Signed)
Patient called CHF clinic triag eline to report weight up 4 lbs since Tuesday paracentesis. Was instructed by our providers to hold his Wednesday Metolazone. Denies SOB, chest pain, dizziness, fever, chills. Does endorse slight tightness coming back in abdomen "like it was before i got it drained". Advised per Otilio Saber PA-C to weigh again tomorrow morning and if weight continues to rise to take metolazone tablet with additional 40 meq potassium. Patient has upcoming apt with our office and will assess further at that time. Advised if he begins to feel SOB, lightheaded, dizzy, CP, fever, chills, or any other adverse s/s to call our afterhours triage line and/or seek emergency medical assistance. Aware and agreeable to plan as stated above.  Ave Filter

## 2015-08-19 ENCOUNTER — Telehealth (HOSPITAL_COMMUNITY): Payer: Self-pay | Admitting: Cardiology

## 2015-08-19 ENCOUNTER — Encounter (HOSPITAL_COMMUNITY): Payer: Self-pay

## 2015-08-19 ENCOUNTER — Ambulatory Visit (HOSPITAL_COMMUNITY)
Admission: RE | Admit: 2015-08-19 | Discharge: 2015-08-19 | Disposition: A | Discharge status: Home / Self Care | Payer: Medicare HMO | Source: Ambulatory Visit | Attending: Cardiology | Admitting: Cardiology

## 2015-08-19 ENCOUNTER — Encounter: Payer: Self-pay | Admitting: Physician Assistant

## 2015-08-19 VITALS — BP 90/50 | HR 55 | Wt 161.8 lb

## 2015-08-19 DIAGNOSIS — Z8701 Personal history of pneumonia (recurrent): Secondary | ICD-10-CM | POA: Insufficient documentation

## 2015-08-19 DIAGNOSIS — R0683 Snoring: Secondary | ICD-10-CM | POA: Diagnosis not present

## 2015-08-19 DIAGNOSIS — I5020 Unspecified systolic (congestive) heart failure: Secondary | ICD-10-CM

## 2015-08-19 DIAGNOSIS — I2583 Coronary atherosclerosis due to lipid rich plaque: Secondary | ICD-10-CM

## 2015-08-19 DIAGNOSIS — I2510 Atherosclerotic heart disease of native coronary artery without angina pectoris: Secondary | ICD-10-CM | POA: Diagnosis not present

## 2015-08-19 DIAGNOSIS — R55 Syncope and collapse: Secondary | ICD-10-CM | POA: Diagnosis not present

## 2015-08-19 DIAGNOSIS — D509 Iron deficiency anemia, unspecified: Secondary | ICD-10-CM | POA: Insufficient documentation

## 2015-08-19 DIAGNOSIS — N183 Chronic kidney disease, stage 3 unspecified: Secondary | ICD-10-CM

## 2015-08-19 DIAGNOSIS — Z87891 Personal history of nicotine dependence: Secondary | ICD-10-CM | POA: Insufficient documentation

## 2015-08-19 DIAGNOSIS — I5022 Chronic systolic (congestive) heart failure: Secondary | ICD-10-CM

## 2015-08-19 DIAGNOSIS — M1990 Unspecified osteoarthritis, unspecified site: Secondary | ICD-10-CM | POA: Insufficient documentation

## 2015-08-19 DIAGNOSIS — M109 Gout, unspecified: Secondary | ICD-10-CM

## 2015-08-19 DIAGNOSIS — I5023 Acute on chronic systolic (congestive) heart failure: Secondary | ICD-10-CM | POA: Diagnosis present

## 2015-08-19 DIAGNOSIS — E785 Hyperlipidemia, unspecified: Secondary | ICD-10-CM | POA: Diagnosis not present

## 2015-08-19 DIAGNOSIS — I255 Ischemic cardiomyopathy: Secondary | ICD-10-CM | POA: Diagnosis not present

## 2015-08-19 DIAGNOSIS — I059 Rheumatic mitral valve disease, unspecified: Secondary | ICD-10-CM | POA: Insufficient documentation

## 2015-08-19 DIAGNOSIS — Z950 Presence of cardiac pacemaker: Secondary | ICD-10-CM | POA: Insufficient documentation

## 2015-08-19 DIAGNOSIS — Z951 Presence of aortocoronary bypass graft: Secondary | ICD-10-CM | POA: Diagnosis not present

## 2015-08-19 DIAGNOSIS — Z8249 Family history of ischemic heart disease and other diseases of the circulatory system: Secondary | ICD-10-CM | POA: Diagnosis not present

## 2015-08-19 DIAGNOSIS — R011 Cardiac murmur, unspecified: Secondary | ICD-10-CM | POA: Diagnosis not present

## 2015-08-19 DIAGNOSIS — Z95810 Presence of automatic (implantable) cardiac defibrillator: Secondary | ICD-10-CM | POA: Diagnosis not present

## 2015-08-19 DIAGNOSIS — I493 Ventricular premature depolarization: Secondary | ICD-10-CM | POA: Insufficient documentation

## 2015-08-19 DIAGNOSIS — I252 Old myocardial infarction: Secondary | ICD-10-CM | POA: Diagnosis not present

## 2015-08-19 DIAGNOSIS — N179 Acute kidney failure, unspecified: Secondary | ICD-10-CM | POA: Insufficient documentation

## 2015-08-19 DIAGNOSIS — Z7982 Long term (current) use of aspirin: Secondary | ICD-10-CM | POA: Insufficient documentation

## 2015-08-19 LAB — BASIC METABOLIC PANEL WITH GFR
Anion gap: 10 (ref 5–15)
BUN: 47 mg/dL — ABNORMAL HIGH (ref 6–20)
CO2: 26 mmol/L (ref 22–32)
Calcium: 8.6 mg/dL — ABNORMAL LOW (ref 8.9–10.3)
Chloride: 101 mmol/L (ref 101–111)
Creatinine, Ser: 2.25 mg/dL — ABNORMAL HIGH (ref 0.61–1.24)
GFR calc Af Amer: 31 mL/min — ABNORMAL LOW
GFR calc non Af Amer: 27 mL/min — ABNORMAL LOW
Glucose, Bld: 158 mg/dL — ABNORMAL HIGH (ref 65–99)
Potassium: 3.3 mmol/L — ABNORMAL LOW (ref 3.5–5.1)
Sodium: 137 mmol/L (ref 135–145)

## 2015-08-19 LAB — BRAIN NATRIURETIC PEPTIDE: B Natriuretic Peptide: 2515.8 pg/mL — ABNORMAL HIGH (ref 0.0–100.0)

## 2015-08-19 NOTE — Patient Instructions (Signed)
Labs today. Will call you for abnormal results, otherwise, no news is good news!  Follow up 4 weeks with Dr. Shirlee Latch.  Do the following things EVERYDAY: 1) Weigh yourself in the morning before breakfast. Write it down and keep it in a log. 2) Take your medicines as prescribed 3) Eat low salt foods-Limit salt (sodium) to 2000 mg per day.  4) Stay as active as you can everyday 5) Limit all fluids for the day to less than 2 liters
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
## 2015-08-19 NOTE — Telephone Encounter (Signed)
Pt aware and voiced understanding, patient will have labs drawn at PCP appt 8/2

## 2015-08-19 NOTE — Telephone Encounter (Signed)
-----   Message from Graciella Freer, PA-C sent at 08/19/2015 11:43 AM EDT ----- Still consistent with HF.   K low. Please increase K to 40 meq BID and have take 40 meq with metolazone instead of 20 meq.  Please recheck next week, (Friday OK)   Thanks.    [name] [address] [place], PA-C 08/19/2015 11:42 AM

## 2015-08-22 ENCOUNTER — Telehealth (HOSPITAL_COMMUNITY): Payer: Self-pay | Admitting: *Deleted

## 2015-08-22 DIAGNOSIS — R188 Other ascites: Secondary | ICD-10-CM

## 2015-08-22 NOTE — Telephone Encounter (Signed)
Pt called this am to let us know that his weight is still elevated 3 pounds and would like to proceed with the paracentesis. As per Mardelle Matte note it is ok to schedule that.  Paracentesis at [facility] for August 1st at 10am.  Pt is aware.

## 2015-08-26 ENCOUNTER — Ambulatory Visit (HOSPITAL_COMMUNITY)
Admission: RE | Admit: 2015-08-26 | Discharge: 2015-08-26 | Disposition: A | Discharge status: Home / Self Care | Payer: Medicare HMO | Source: Ambulatory Visit | Attending: Student | Admitting: Student

## 2015-08-26 DIAGNOSIS — R188 Other ascites: Secondary | ICD-10-CM | POA: Insufficient documentation

## 2015-08-26 NOTE — Procedures (Signed)
Ultrasound-guided therapeutic paracentesis performed yielding 4.6 liters of slightly hazy, amber fluid. No immediate complications.

## 2015-08-27 ENCOUNTER — Ambulatory Visit (INDEPENDENT_AMBULATORY_CARE_PROVIDER_SITE_OTHER): Payer: Medicare HMO | Admitting: Family Medicine

## 2015-08-27 ENCOUNTER — Encounter: Payer: Self-pay | Admitting: Family Medicine

## 2015-08-27 VITALS — BP 99/63 | HR 57 | Wt 158.0 lb

## 2015-08-27 DIAGNOSIS — E039 Hypothyroidism, unspecified: Secondary | ICD-10-CM

## 2015-08-27 DIAGNOSIS — I5022 Chronic systolic (congestive) heart failure: Secondary | ICD-10-CM | POA: Diagnosis not present

## 2015-08-27 DIAGNOSIS — I5020 Unspecified systolic (congestive) heart failure: Secondary | ICD-10-CM

## 2015-08-27 DIAGNOSIS — R1310 Dysphagia, unspecified: Secondary | ICD-10-CM | POA: Diagnosis not present

## 2015-08-27 DIAGNOSIS — E038 Other specified hypothyroidism: Secondary | ICD-10-CM | POA: Diagnosis not present

## 2015-08-27 DIAGNOSIS — G479 Sleep disorder, unspecified: Secondary | ICD-10-CM

## 2015-08-27 NOTE — Progress Notes (Signed)
Impression and Recommendations:    1. Dysphagia- diff swallowing solids and liquids   2. Subclinical hypothyroidism   3. Difficulty sleeping   4. Systolic heart failure, unspecified heart failure chronicity (HCC)   5. Systolic CHF  (Dr Marca Ancona)      Continue to eat slowly and consider adding nectar to foods to thicken them as needed.  Patient feeling with a little more energy but thinks it's likely due to the recent paracentesis.  He sleeps much better when the fluid was taken off his lungs.  We will obtain a BMP for patient per his cardiologist request. Results will be sent to them for further management  Patient's Medications  New Prescriptions   No medications on file  Previous Medications   ALLOPURINOL (ZYLOPRIM) 100 MG TABLET    TAKE 1 TABLET BY MOUTH DAILY.   ALPRAZOLAM (XANAX) 1 MG TABLET    Take 1/2 to 1 tab nightly before bed.   AMIODARONE (PACERONE) 200 MG TABLET    Take 1 tablet (200 mg total) by mouth daily.   ASPIRIN EC 81 MG TABLET    Take 81 mg by mouth at bedtime.    ATORVASTATIN (LIPITOR) 20 MG TABLET    Take 1 tablet (20 mg total) by mouth daily at 6 PM.   CARVEDILOL (COREG) 3.125 MG TABLET    Take 1 tablet (3.125 mg total) by mouth 2 (two) times daily with a meal.   FLUTICASONE (FLONASE) 50 MCG/ACT NASAL SPRAY    Place 2 sprays into both nostrils daily.   LEVOTHYROXINE (SYNTHROID, LEVOTHROID) 50 MCG TABLET    Take 1 tablet (50 mcg total) by mouth daily.   MELATONIN 3 MG CAPS    Take 3 mg by mouth at bedtime.   METOLAZONE (ZAROXOLYN) 2.5 MG TABLET    Take 1 tablet (2.5 mg total) by mouth once a week. Wednesdays   NIACIN (NIASPAN) 500 MG CR TABLET    Take 1 tablet (500 mg total) by mouth at bedtime.   POLYVINYL ALCOHOL-POVIDONE (REFRESH) 1.4-0.6 % OPHTHALMIC SOLUTION    Place 1 drop into the left eye daily as needed (for irritation).    POTASSIUM CHLORIDE (K-DUR) 10 MEQ TABLET    Take 2-4 tablets (20-40 mEq total) by mouth as directed. 40 meq in the  AM and 20 meq in the PM -With an additional 20 on Wednes with metolazone   TORSEMIDE (DEMADEX) 20 MG TABLET    Take 40 mg by mouth 2 (two) times daily.  Modified Medications   No medications on file  Discontinued Medications   No medications on file    Return in about 5 weeks (around 09/29/2015) for TSH, T3, T4.  The patient was counseled, risk factors were discussed, anticipatory guidance given.  Gross side effects, risk and benefits, and alternatives of medications discussed with patient.  Patient is aware that all medications have potential side effects and we are unable to predict every side effect or drug-drug interaction that may occur.  Expresses verbal understanding and consents to current therapy plan and treatment regimen.  Please see AVS handed out to patient at the end of our visit for further patient instructions/ counseling done pertaining to today's office visit.    Note: This document was prepared using Dragon voice recognition software and may include unintentional dictation errors.   --------------------------------------------------------------------------------------------------------------------------------------------------------------------------------------------------------------------------------------------    Subjective:    CC:  Chief Complaint  Patient presents with  . Dysphagia    HPI: [name] Roberts is a 75 y.o. male who presents to [facility] today for Follow-up after his swallowing evaluation.   His wife is here with him today.   I'm seeing patient in follow-up of his difficulty in swallowing.  - Patient had swallow eval- in which they rec thickened liquids and take smaller bites and slow down significantly.  He hasn't thickened his liquids and foods yet with the nectar but slowing down has made huge difference.   No longer choking with eating, no more coughing spells.     - Flonase has helped with postnasal drip and  reduced the congestion "back there" and helps with swallowing. Patient and wife pleased.   Of note:  Yesterday patient had ultrasound-guided therapeutic paracentesis which they took off 4.6 L of his peritoneal fluid. Patient states his breathing is much improved today.  Patient apparently needs a BMP drawn today which I found an order in his chart for.   Patient's blood pressures have been low all his life and per wife, the cardiologists are ok with his blood pressure.  They did not suggest any medication changes, and follow patient regularly and closely.  Patient without any orthostatic hypotension symptoms.     Wt Readings from Last 3 Encounters:  08/27/15 158 lb (71.7 kg)  08/19/15 161 lb 12.8 oz (73.4 kg)  08/05/15 172 lb 3.2 oz (78.1 kg)   BP Readings from Last 3 Encounters:  08/27/15 99/63  08/26/15 (!) 88/58  08/19/15 (!) 90/50   Pulse Readings from Last 3 Encounters:  08/27/15 (!) 57  08/19/15 (!) 55  08/05/15 78     Patient Active Problem List   Diagnosis Date Noted  . CKD (chronic kidney disease), stage III 06/20/2015    Priority: High  . CAD  (Dr Reatha Harps) 03/07/2008    Priority: High  . Systolic CHF  (Dr Marca Ancona) 03/07/2008    Priority: High  . Automatic implantable cardioverter-defibrillator in situ (Dr Ladona Ridgel- EP-Cards) 03/07/2008    Priority: High  . Hypothyroidism 08/08/2015  . Difficulty sleeping 08/02/2015  . h/o AMI 08/02/2015  . Dysphagia- diff swallowing solids and liquids 08/02/2015  . Arthritis 07/12/2015  . HLD (hyperlipidemia) 07/12/2015  . h/o Iron deficiency anemia 07/12/2015  . Acute on chronic kidney failure (HCC) 07/08/2015  . Severe left ventricular systolic dysfunction   . 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 h/o Syncope 06/20/2015  . Normocytic anemia 06/20/2015  . Subclinical hypothyroidism 06/20/2015  . Peripheral edema 09/27/2014  . Abdominal distention- due to CHF 09/27/2014  .  H/o Herpes zoster 07/23/2014  . Counseling regarding end of life decision making 06/18/2014  . Elevated transaminase level 06/13/2012  . CARDIOMYOPATHY, ISCHEMIC 09/05/2008  . Hyperlipidemia 03/07/2008  . Gout of multiple sites 03/07/2008  . Personal history of tobacco use 04/26/1955    Past Medical history, Surgical history, Family history, Social history, Allergies and Medications have been entered into the medical record, reviewed and changed as needed.   Allergies:  Allergies  Allergen Reactions  . Erythromycin Base Swelling    Watery red eyes and red streaks down the face    Review of Systems: No fever/ chills, night sweats, no unintended weight loss, No chest pain, or increased shortness of breath. No N/V/D.  Pertinent positives and negatives noted in HPI above    Objective:   Blood pressure 99/63, pulse (!) 57, weight 158 lb (71.7 kg). Body mass index is  24.02 kg/m.  General: Well Developed, well nourished, appropriate for stated age.  Neuro: Alert and oriented x3, extra-ocular muscles intact, sensation grossly intact.  HEENT: Normocephalic, atraumatic, neck supple   Skin: Warm and dry, no gross rash. Cardiac: RRR, S1 S2,  no murmurs rubs or gallops.  Respiratory: ECTA B/L, Not using accessory muscles, speaking in full sentences-unlabored. Vascular:  No gross lower ext edema, cap RF less 2 sec. Psych: No SI/HI, Insight and judgement good

## 2015-08-27 NOTE — Patient Instructions (Signed)
Return to clinic the week of September 4 for repeat of your thyroid hormones and to obtain lab work

## 2015-09-02 ENCOUNTER — Ambulatory Visit (INDEPENDENT_AMBULATORY_CARE_PROVIDER_SITE_OTHER): Payer: Medicare HMO

## 2015-09-02 ENCOUNTER — Other Ambulatory Visit (HOSPITAL_COMMUNITY): Payer: Self-pay | Admitting: Cardiology

## 2015-09-02 DIAGNOSIS — I5020 Unspecified systolic (congestive) heart failure: Secondary | ICD-10-CM

## 2015-09-02 DIAGNOSIS — Z95810 Presence of automatic (implantable) cardiac defibrillator: Secondary | ICD-10-CM

## 2015-09-02 DIAGNOSIS — I509 Heart failure, unspecified: Secondary | ICD-10-CM

## 2015-09-02 MED ORDER — METOLAZONE 2.5 MG PO TABS: 2.5000 mg | ORAL_TABLET | ORAL | 3 refills | Status: DC

## 2015-09-02 NOTE — Progress Notes (Signed)
EPIC Encounter for ICM Monitoring  Patient Name: [name] Roberts is a 75 y.o. male Date: 09/02/2015 Primary Care Physican: [name], DO Primary Cardiologist: Shirlee Latch Electrophysiologist: Ladona Ridgel Dry Weight: 154.8 lb       Heart Failure questions reviewed, pt asymptomatic today.  He reported having 3 paracentesis 07/25/2015, 08/12/2015 and 08/26/2015 with a total of 21 liters of fluid removed.  He has been having weight gain with belly fluid.  He reported Metolazone has been helping with fluid overload but still develops belly fluid despite taking diuretics.     Thoracic impedance abnormal suggesting fluid accumulation 08/19/2015 to 08/26/2015 and 08/27/2015 to 08/29/2015 and returned to baseline 08/29/2015.  Recommendations: No changes.  Patient is in contact with HF clinic for managing symptoms.    ICM trend: 09/02/2015     Follow-up plan: ICM clinic phone appointment on 10/06/2015.  Copy of ICM check sent to primary cardiologist and device physician.   Karie Soda, RN 09/02/2015 11:10 AM

## 2015-09-08 ENCOUNTER — Telehealth (HOSPITAL_COMMUNITY): Payer: Self-pay | Admitting: *Deleted

## 2015-09-08 MED ORDER — TORSEMIDE 20 MG PO TABS: 60.0000 mg | ORAL_TABLET | Freq: Two times a day (BID) | ORAL | 3 refills | Status: DC

## 2015-09-08 NOTE — Telephone Encounter (Signed)
Take metolazone tonight with his pm torsemide, and increase torsemide to 60 mg bid.  Take metolazone again on Wednesday as usual. Have him seen this week.

## 2015-09-08 NOTE — Telephone Encounter (Signed)
Pt called to report wt gain, he states he had paracentesis 8/1 wt was 152 lb after.  Today he is at 161 lb, he states wt has been increasing since the paracentesis and he has gained 4 lb in past 2 days.  He is taking Torsemide 40 mg BID and metolazone 2.5 mg every Wed.  He states his abd is swollen, denies LE edema, pt c/o SOB with very minimal exertion.  Will discuss w/Dr Shirlee Latch and call pt back.

## 2015-09-08 NOTE — Telephone Encounter (Signed)
Patient aware and voiced understanding  Add on 8/17 @1120 

## 2015-09-11 ENCOUNTER — Encounter: Payer: Self-pay | Admitting: Internal Medicine

## 2015-09-11 ENCOUNTER — Encounter (HOSPITAL_COMMUNITY): Payer: Self-pay

## 2015-09-11 ENCOUNTER — Ambulatory Visit (HOSPITAL_COMMUNITY)
Admission: RE | Admit: 2015-09-11 | Discharge: 2015-09-11 | Disposition: A | Discharge status: Home / Self Care | Payer: Medicare HMO | Source: Ambulatory Visit | Attending: Cardiology | Admitting: Cardiology

## 2015-09-11 VITALS — Wt 163.0 lb

## 2015-09-11 DIAGNOSIS — M109 Gout, unspecified: Secondary | ICD-10-CM | POA: Insufficient documentation

## 2015-09-11 DIAGNOSIS — I5022 Chronic systolic (congestive) heart failure: Secondary | ICD-10-CM | POA: Diagnosis not present

## 2015-09-11 DIAGNOSIS — I493 Ventricular premature depolarization: Secondary | ICD-10-CM

## 2015-09-11 DIAGNOSIS — N183 Chronic kidney disease, stage 3 unspecified: Secondary | ICD-10-CM

## 2015-09-11 DIAGNOSIS — R188 Other ascites: Secondary | ICD-10-CM

## 2015-09-11 DIAGNOSIS — Z951 Presence of aortocoronary bypass graft: Secondary | ICD-10-CM | POA: Diagnosis not present

## 2015-09-11 DIAGNOSIS — I255 Ischemic cardiomyopathy: Secondary | ICD-10-CM | POA: Insufficient documentation

## 2015-09-11 DIAGNOSIS — Z79899 Other long term (current) drug therapy: Secondary | ICD-10-CM | POA: Diagnosis not present

## 2015-09-11 DIAGNOSIS — R0683 Snoring: Secondary | ICD-10-CM | POA: Diagnosis not present

## 2015-09-11 DIAGNOSIS — Z8249 Family history of ischemic heart disease and other diseases of the circulatory system: Secondary | ICD-10-CM | POA: Diagnosis not present

## 2015-09-11 DIAGNOSIS — Z881 Allergy status to other antibiotic agents status: Secondary | ICD-10-CM | POA: Insufficient documentation

## 2015-09-11 DIAGNOSIS — Z95810 Presence of automatic (implantable) cardiac defibrillator: Secondary | ICD-10-CM | POA: Diagnosis not present

## 2015-09-11 DIAGNOSIS — I252 Old myocardial infarction: Secondary | ICD-10-CM | POA: Insufficient documentation

## 2015-09-11 DIAGNOSIS — I2510 Atherosclerotic heart disease of native coronary artery without angina pectoris: Secondary | ICD-10-CM | POA: Insufficient documentation

## 2015-09-11 DIAGNOSIS — E785 Hyperlipidemia, unspecified: Secondary | ICD-10-CM | POA: Insufficient documentation

## 2015-09-11 DIAGNOSIS — R55 Syncope and collapse: Secondary | ICD-10-CM | POA: Insufficient documentation

## 2015-09-11 DIAGNOSIS — Z7982 Long term (current) use of aspirin: Secondary | ICD-10-CM | POA: Insufficient documentation

## 2015-09-11 DIAGNOSIS — Z87891 Personal history of nicotine dependence: Secondary | ICD-10-CM | POA: Diagnosis not present

## 2015-09-11 DIAGNOSIS — R4020 Unspecified coma: Secondary | ICD-10-CM | POA: Insufficient documentation

## 2015-09-11 LAB — CBC
HCT: 39.5 % (ref 39.0–52.0)
Hemoglobin: 12.5 g/dL — ABNORMAL LOW (ref 13.0–17.0)
MCH: 30.5 pg (ref 26.0–34.0)
MCHC: 31.6 g/dL (ref 30.0–36.0)
MCV: 96.3 fL (ref 78.0–100.0)
PLATELETS: 152 10*3/uL (ref 150–400)
RBC: 4.1 MIL/uL — ABNORMAL LOW (ref 4.22–5.81)
RDW: 16.4 % — AB (ref 11.5–15.5)
WBC: 6.6 10*3/uL (ref 4.0–10.5)

## 2015-09-11 LAB — BASIC METABOLIC PANEL
Anion gap: 9 (ref 5–15)
BUN: 45 mg/dL — AB (ref 6–20)
CALCIUM: 9 mg/dL (ref 8.9–10.3)
CO2: 31 mmol/L (ref 22–32)
CREATININE: 2.24 mg/dL — AB (ref 0.61–1.24)
Chloride: 98 mmol/L — ABNORMAL LOW (ref 101–111)
GFR calc Af Amer: 31 mL/min — ABNORMAL LOW (ref >60)
GFR, EST NON AFRICAN AMERICAN: 27 mL/min — AB (ref >60)
GLUCOSE: 118 mg/dL — AB (ref 65–99)
Potassium: 2.8 mmol/L — ABNORMAL LOW (ref 3.5–5.1)
SODIUM: 138 mmol/L (ref 135–145)

## 2015-09-11 NOTE — Patient Instructions (Signed)
Routine lab work today. Will notify you of abnormal results, otherwise no news is good news!  Will refer you to Neurology for loss of consciousness. Address: [address], [place], [place] [address] Hours: 8AM-5PM Phone: [phone]  Will schedule you for Paracentesis to relieve fluid on belly.  Follow up 2 weeks with Dr. Shirlee Latch.  Do the following things EVERYDAY: 1) Weigh yourself in the morning before breakfast. Write it down and keep it in a log. 2) Take your medicines as prescribed 3) Eat low salt foods-Limit salt (sodium) to 2000 mg per day.  4) Stay as active as you can everyday 5) Limit all fluids for the day to less than 2 liters

## 2015-09-11 NOTE — Progress Notes (Signed)
Advanced Heart Failure Medication Review by a Pharmacist  Does the patient  feel that his/her medications are working for him/her?  yes  Has the patient been experiencing any side effects to the medications prescribed?  no  Does the patient measure his/her own blood pressure or blood glucose at home?  yes   Does the patient have any problems obtaining medications due to transportation or finances?   no  Understanding of regimen: good Understanding of indications: good Potential of compliance: good Patient understands to avoid NSAIDs. Patient understands to avoid decongestants.  Issues to address at subsequent visits: None   Pharmacist comments:  [name] Roberts is a pleasant 75 yo M presenting with his wife and a current medication list. He reports good compliance with his regimen but states that his balance has been off recently. He did start levothyroxine about a month ago but I don't think this is causing these side effects. They did not have any other medication-related questions or concerns for me at this time.   Tyler Deis. Bonnye Fava, PharmD, BCPS, CPP Clinical Pharmacist Pager: [phone] Phone: [phone] 09/11/2015 12:07 PM     Time with patient: 10 minutes Preparation and documentation time: 2 minutes Total time: 12 minutes
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
## 2015-09-12 ENCOUNTER — Telehealth (HOSPITAL_COMMUNITY): Payer: Self-pay | Admitting: Cardiology

## 2015-09-12 DIAGNOSIS — I509 Heart failure, unspecified: Secondary | ICD-10-CM

## 2015-09-12 NOTE — Telephone Encounter (Signed)
Patient aware. And voiced understanding Repeat labs 8/28

## 2015-09-12 NOTE — Telephone Encounter (Signed)
Progress notes x 3, H&P, demographic info, and med list sent for a referral as ordered by Amy Clegg,NP. Reason for referral-syncope

## 2015-09-12 NOTE — Telephone Encounter (Signed)
-----   Message from Sherald Hess, NP sent at 09/11/2015  1:48 PM EDT ----- Please call increase potassium to 40 meq three times a day. Renal function ok. Repeat BMET next week.

## 2015-09-15 ENCOUNTER — Other Ambulatory Visit (HOSPITAL_COMMUNITY): Payer: Self-pay | Admitting: *Deleted

## 2015-09-16 ENCOUNTER — Encounter: Payer: Self-pay | Admitting: Neurology

## 2015-09-16 ENCOUNTER — Other Ambulatory Visit: Payer: Self-pay

## 2015-09-16 ENCOUNTER — Ambulatory Visit (INDEPENDENT_AMBULATORY_CARE_PROVIDER_SITE_OTHER): Payer: Medicare HMO | Admitting: Neurology

## 2015-09-16 ENCOUNTER — Encounter (HOSPITAL_COMMUNITY): Payer: Medicare HMO

## 2015-09-16 VITALS — BP 97/58 | HR 59 | Resp 20 | Ht 68.0 in | Wt 166.0 lb

## 2015-09-16 DIAGNOSIS — R296 Repeated falls: Secondary | ICD-10-CM

## 2015-09-16 DIAGNOSIS — I63342 Cerebral infarction due to thrombosis of left cerebellar artery: Secondary | ICD-10-CM | POA: Diagnosis not present

## 2015-09-16 DIAGNOSIS — I5022 Chronic systolic (congestive) heart failure: Secondary | ICD-10-CM

## 2015-09-16 DIAGNOSIS — I63542 Cerebral infarction due to unspecified occlusion or stenosis of left cerebellar artery: Secondary | ICD-10-CM

## 2015-09-16 DIAGNOSIS — E8779 Other fluid overload: Secondary | ICD-10-CM | POA: Diagnosis not present

## 2015-09-16 DIAGNOSIS — R55 Syncope and collapse: Secondary | ICD-10-CM | POA: Diagnosis not present
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
## 2015-09-16 NOTE — Patient Instructions (Addendum)
I would recommend you discuss physical therapy for gait and balance training with Dr. Shirlee Latch. I am not sure you have the stamina for PT quite yet.  You may do better with a 2 wheeled walker. PT may help with recommendations as far as what type of walker, vs. Cane.  Change positions slowly and turn slowly.  As far as tests, I will order an EEG (brainwave test), which we will schedule. We will call you with the results.  We will also do a carotid ultrasound.  You have difficulty with fine motor skills on the L, could be sequelae from an old cerebellar stroke.

## 2015-09-17 ENCOUNTER — Other Ambulatory Visit (HOSPITAL_COMMUNITY): Payer: Self-pay | Admitting: *Deleted

## 2015-09-17 ENCOUNTER — Encounter: Payer: Self-pay | Admitting: Internal Medicine

## 2015-09-17 MED ORDER — POTASSIUM CHLORIDE ER 10 MEQ PO TBCR: 40.0000 meq | EXTENDED_RELEASE_TABLET | Freq: Three times a day (TID) | ORAL | 3 refills | Status: DC

## 2015-09-17 NOTE — Telephone Encounter (Signed)
Pt informed and is agreeable.  Pt was transferred to front desk to schedule appt.

## 2015-09-18 ENCOUNTER — Telehealth: Payer: Self-pay | Admitting: Neurology

## 2015-09-18 ENCOUNTER — Ambulatory Visit (HOSPITAL_COMMUNITY)
Admission: RE | Admit: 2015-09-18 | Discharge: 2015-09-18 | Disposition: A | Discharge status: Home / Self Care | Payer: Medicare HMO | Source: Ambulatory Visit | Attending: Adult Health | Admitting: Adult Health

## 2015-09-18 DIAGNOSIS — R188 Other ascites: Secondary | ICD-10-CM | POA: Insufficient documentation

## 2015-09-18 NOTE — Procedures (Signed)
Ultrasound-guided therapeutic paracentesis performed yielding 4.6 liters of slightly hazy, amber fluid. No immediate complications.  

## 2015-09-18 NOTE — Telephone Encounter (Signed)
Patient is ready to be scheduled for doppler no pa needed [id number] thanks Annabelle Harman.

## 2015-09-20 ENCOUNTER — Inpatient Hospital Stay (HOSPITAL_COMMUNITY)
Admission: EM | Admit: 2015-09-20 | Discharge: 2015-09-24 | DRG: 287 | Disposition: A | Discharge status: Home / Self Care | Payer: Medicare HMO | Attending: Internal Medicine | Admitting: Internal Medicine

## 2015-09-20 ENCOUNTER — Encounter (HOSPITAL_COMMUNITY): Payer: Self-pay

## 2015-09-20 ENCOUNTER — Emergency Department (HOSPITAL_COMMUNITY): Payer: Medicare HMO

## 2015-09-20 DIAGNOSIS — N179 Acute kidney failure, unspecified: Secondary | ICD-10-CM | POA: Diagnosis present

## 2015-09-20 DIAGNOSIS — I472 Ventricular tachycardia: Secondary | ICD-10-CM | POA: Diagnosis not present

## 2015-09-20 DIAGNOSIS — I5023 Acute on chronic systolic (congestive) heart failure: Principal | ICD-10-CM | POA: Diagnosis present

## 2015-09-20 DIAGNOSIS — M1990 Unspecified osteoarthritis, unspecified site: Secondary | ICD-10-CM | POA: Diagnosis present

## 2015-09-20 DIAGNOSIS — E785 Hyperlipidemia, unspecified: Secondary | ICD-10-CM | POA: Diagnosis present

## 2015-09-20 DIAGNOSIS — Z87891 Personal history of nicotine dependence: Secondary | ICD-10-CM

## 2015-09-20 DIAGNOSIS — N183 Chronic kidney disease, stage 3 (moderate): Secondary | ICD-10-CM | POA: Diagnosis present

## 2015-09-20 DIAGNOSIS — R140 Abdominal distension (gaseous): Secondary | ICD-10-CM | POA: Diagnosis not present

## 2015-09-20 DIAGNOSIS — I272 Other secondary pulmonary hypertension: Secondary | ICD-10-CM | POA: Diagnosis present

## 2015-09-20 DIAGNOSIS — E875 Hyperkalemia: Secondary | ICD-10-CM | POA: Diagnosis present

## 2015-09-20 DIAGNOSIS — E039 Hypothyroidism, unspecified: Secondary | ICD-10-CM | POA: Diagnosis present

## 2015-09-20 DIAGNOSIS — I5022 Chronic systolic (congestive) heart failure: Secondary | ICD-10-CM

## 2015-09-20 DIAGNOSIS — F419 Anxiety disorder, unspecified: Secondary | ICD-10-CM | POA: Diagnosis present

## 2015-09-20 DIAGNOSIS — K761 Chronic passive congestion of liver: Secondary | ICD-10-CM | POA: Diagnosis present

## 2015-09-20 DIAGNOSIS — M109 Gout, unspecified: Secondary | ICD-10-CM | POA: Diagnosis present

## 2015-09-20 DIAGNOSIS — I255 Ischemic cardiomyopathy: Secondary | ICD-10-CM | POA: Diagnosis present

## 2015-09-20 DIAGNOSIS — I2510 Atherosclerotic heart disease of native coronary artery without angina pectoris: Secondary | ICD-10-CM | POA: Diagnosis present

## 2015-09-20 DIAGNOSIS — I493 Ventricular premature depolarization: Secondary | ICD-10-CM | POA: Diagnosis present

## 2015-09-20 DIAGNOSIS — E038 Other specified hypothyroidism: Secondary | ICD-10-CM | POA: Diagnosis not present

## 2015-09-20 DIAGNOSIS — R0602 Shortness of breath: Secondary | ICD-10-CM | POA: Diagnosis present

## 2015-09-20 DIAGNOSIS — R188 Other ascites: Secondary | ICD-10-CM | POA: Diagnosis present

## 2015-09-20 DIAGNOSIS — I252 Old myocardial infarction: Secondary | ICD-10-CM

## 2015-09-20 DIAGNOSIS — I25812 Atherosclerosis of bypass graft of coronary artery of transplanted heart without angina pectoris: Secondary | ICD-10-CM | POA: Diagnosis present

## 2015-09-20 DIAGNOSIS — Z79899 Other long term (current) drug therapy: Secondary | ICD-10-CM | POA: Diagnosis not present

## 2015-09-20 DIAGNOSIS — Z7982 Long term (current) use of aspirin: Secondary | ICD-10-CM

## 2015-09-20 DIAGNOSIS — I509 Heart failure, unspecified: Secondary | ICD-10-CM

## 2015-09-20 DIAGNOSIS — Z95810 Presence of automatic (implantable) cardiac defibrillator: Secondary | ICD-10-CM | POA: Diagnosis not present

## 2015-09-20 DIAGNOSIS — Z951 Presence of aortocoronary bypass graft: Secondary | ICD-10-CM | POA: Diagnosis not present

## 2015-09-20 DIAGNOSIS — F411 Generalized anxiety disorder: Secondary | ICD-10-CM | POA: Diagnosis present

## 2015-09-20 LAB — BASIC METABOLIC PANEL
Anion gap: 8 (ref 5–15)
BUN: 51 mg/dL — AB (ref 6–20)
CHLORIDE: 101 mmol/L (ref 101–111)
CO2: 26 mmol/L (ref 22–32)
CREATININE: 2.7 mg/dL — AB (ref 0.61–1.24)
Calcium: 8.9 mg/dL (ref 8.9–10.3)
GFR calc Af Amer: 25 mL/min — ABNORMAL LOW (ref >60)
GFR calc non Af Amer: 22 mL/min — ABNORMAL LOW (ref >60)
GLUCOSE: 103 mg/dL — AB (ref 65–99)
Potassium: 5.6 mmol/L — ABNORMAL HIGH (ref 3.5–5.1)
Sodium: 135 mmol/L (ref 135–145)

## 2015-09-20 LAB — I-STAT TROPONIN, ED: Troponin i, poc: 0.02 ng/mL (ref 0.00–0.08)

## 2015-09-20 LAB — CBC
HCT: 40 % (ref 39.0–52.0)
Hemoglobin: 12.7 g/dL — ABNORMAL LOW (ref 13.0–17.0)
MCH: 30.8 pg (ref 26.0–34.0)
MCHC: 31.8 g/dL (ref 30.0–36.0)
MCV: 97.1 fL (ref 78.0–100.0)
PLATELETS: 159 10*3/uL (ref 150–400)
RBC: 4.12 MIL/uL — AB (ref 4.22–5.81)
RDW: 17 % — AB (ref 11.5–15.5)
WBC: 8 10*3/uL (ref 4.0–10.5)

## 2015-09-20 LAB — HEPATIC FUNCTION PANEL
ALK PHOS: 158 U/L — AB (ref 38–126)
ALT: 54 U/L (ref 17–63)
AST: 67 U/L — ABNORMAL HIGH (ref 15–41)
Albumin: 3.1 g/dL — ABNORMAL LOW (ref 3.5–5.0)
BILIRUBIN INDIRECT: 0.9 mg/dL (ref 0.3–0.9)
BILIRUBIN TOTAL: 1.4 mg/dL — AB (ref 0.3–1.2)
Bilirubin, Direct: 0.5 mg/dL (ref 0.1–0.5)
TOTAL PROTEIN: 6.6 g/dL (ref 6.5–8.1)

## 2015-09-20 LAB — PROTIME-INR
INR: 1.29
Prothrombin Time: 16.1 seconds — ABNORMAL HIGH (ref 11.4–15.2)

## 2015-09-20 LAB — I-STAT CG4 LACTIC ACID, ED: LACTIC ACID, VENOUS: 1.38 mmol/L (ref 0.5–1.9)

## 2015-09-20 LAB — AMMONIA: AMMONIA: 88 umol/L — AB (ref 9–35)

## 2015-09-20 LAB — URINALYSIS, ROUTINE W REFLEX MICROSCOPIC
Bilirubin Urine: NEGATIVE
GLUCOSE, UA: NEGATIVE mg/dL
Hgb urine dipstick: NEGATIVE
Ketones, ur: NEGATIVE mg/dL
LEUKOCYTES UA: NEGATIVE
Nitrite: NEGATIVE
PROTEIN: NEGATIVE mg/dL
SPECIFIC GRAVITY, URINE: 1.008 (ref 1.005–1.030)
pH: 7.5 (ref 5.0–8.0)

## 2015-09-20 LAB — LIPASE, BLOOD: Lipase: 25 U/L (ref 11–51)

## 2015-09-20 LAB — BRAIN NATRIURETIC PEPTIDE: B Natriuretic Peptide: 2128.2 pg/mL — ABNORMAL HIGH (ref 0.0–100.0)

## 2015-09-20 LAB — TROPONIN I: TROPONIN I: 0.03 ng/mL — AB (ref <0.03)

## 2015-09-20 MED ORDER — MELATONIN 3 MG PO TABS
3.0000 mg | ORAL_TABLET | Freq: Every day | ORAL | Status: DC
  Administered 2015-09-20 – 2015-09-23 (×4): 3 mg via ORAL
  Filled 2015-09-20 (×4): qty 1

## 2015-09-20 MED ORDER — ASPIRIN EC 81 MG PO TBEC
81.0000 mg | DELAYED_RELEASE_TABLET | Freq: Every day | ORAL | Status: DC
  Administered 2015-09-20 – 2015-09-23 (×4): 81 mg via ORAL
  Filled 2015-09-20 (×4): qty 1

## 2015-09-20 MED ORDER — SODIUM CHLORIDE 0.9% FLUSH
3.0000 mL | Freq: Two times a day (BID) | INTRAVENOUS | Status: DC
  Administered 2015-09-20 – 2015-09-24 (×7): 3 mL via INTRAVENOUS

## 2015-09-20 MED ORDER — SODIUM CHLORIDE 0.9 % IV SOLN: 250.0000 mL | INTRAVENOUS | Status: DC | PRN

## 2015-09-20 MED ORDER — ALLOPURINOL 100 MG PO TABS
100.0000 mg | ORAL_TABLET | Freq: Every day | ORAL | Status: DC
  Administered 2015-09-21 – 2015-09-24 (×4): 100 mg via ORAL
  Filled 2015-09-20 (×4): qty 1

## 2015-09-20 MED ORDER — SODIUM POLYSTYRENE SULFONATE 15 GM/60ML PO SUSP
15.0000 g | Freq: Once | ORAL | Status: AC
  Administered 2015-09-20: 15 g via ORAL
  Filled 2015-09-20: qty 60

## 2015-09-20 MED ORDER — FLUTICASONE PROPIONATE 50 MCG/ACT NA SUSP
2.0000 | Freq: Every day | NASAL | Status: DC
  Administered 2015-09-22 – 2015-09-24 (×3): 2 via NASAL
  Filled 2015-09-20: qty 16

## 2015-09-20 MED ORDER — SODIUM CHLORIDE 0.9% FLUSH: 3.0000 mL | INTRAVENOUS | Status: DC | PRN

## 2015-09-20 MED ORDER — CARVEDILOL 3.125 MG PO TABS
1.6250 mg | ORAL_TABLET | Freq: Two times a day (BID) | ORAL | Status: DC
  Filled 2015-09-20: qty 1

## 2015-09-20 MED ORDER — ALPRAZOLAM 0.5 MG PO TABS
0.5000 mg | ORAL_TABLET | Freq: Every day | ORAL | Status: DC
  Administered 2015-09-20 – 2015-09-23 (×4): 0.5 mg via ORAL
  Filled 2015-09-20 (×4): qty 1

## 2015-09-20 MED ORDER — POLYVINYL ALCOHOL-POVIDONE 1.4-0.6 % OP SOLN: 1.0000 [drp] | Freq: Every day | OPHTHALMIC | Status: DC

## 2015-09-20 MED ORDER — LACTULOSE 10 GM/15ML PO SOLN
10.0000 g | Freq: Two times a day (BID) | ORAL | Status: DC
  Administered 2015-09-20 – 2015-09-21 (×2): 10 g via ORAL
  Filled 2015-09-20 (×2): qty 15

## 2015-09-20 MED ORDER — FUROSEMIDE 10 MG/ML IJ SOLN
80.0000 mg | Freq: Two times a day (BID) | INTRAMUSCULAR | Status: DC
  Administered 2015-09-20 – 2015-09-21 (×2): 80 mg via INTRAVENOUS
  Filled 2015-09-20 (×2): qty 8

## 2015-09-20 MED ORDER — HEPARIN SODIUM (PORCINE) 5000 UNIT/ML IJ SOLN
5000.0000 [IU] | Freq: Three times a day (TID) | INTRAMUSCULAR | Status: DC
  Administered 2015-09-20 – 2015-09-23 (×8): 5000 [IU] via SUBCUTANEOUS
  Filled 2015-09-20 (×8): qty 1

## 2015-09-20 MED ORDER — ACETAMINOPHEN 325 MG PO TABS: 650.0000 mg | ORAL_TABLET | ORAL | Status: DC | PRN

## 2015-09-20 MED ORDER — LEVOTHYROXINE SODIUM 50 MCG PO TABS
50.0000 ug | ORAL_TABLET | Freq: Every day | ORAL | Status: DC
  Administered 2015-09-21: 50 ug via ORAL
  Filled 2015-09-20 (×2): qty 1

## 2015-09-20 MED ORDER — ATORVASTATIN CALCIUM 20 MG PO TABS
20.0000 mg | ORAL_TABLET | Freq: Every day | ORAL | Status: DC
  Administered 2015-09-20 – 2015-09-23 (×4): 20 mg via ORAL
  Filled 2015-09-20 (×4): qty 1

## 2015-09-20 MED ORDER — SODIUM POLYSTYRENE SULFONATE 15 GM/60ML PO SUSP
15.0000 g | Freq: Once | ORAL | Status: DC
  Filled 2015-09-20: qty 60

## 2015-09-20 MED ORDER — NIACIN ER (ANTIHYPERLIPIDEMIC) 500 MG PO TBCR
500.0000 mg | EXTENDED_RELEASE_TABLET | Freq: Every day | ORAL | Status: DC
  Administered 2015-09-20 – 2015-09-23 (×4): 500 mg via ORAL
  Filled 2015-09-20 (×4): qty 1

## 2015-09-20 MED ORDER — POLYVINYL ALCOHOL 1.4 % OP SOLN
1.0000 [drp] | Freq: Every day | OPHTHALMIC | Status: DC
  Administered 2015-09-21 – 2015-09-24 (×4): 1 [drp] via OPHTHALMIC
  Filled 2015-09-20: qty 15

## 2015-09-20 MED ORDER — AMIODARONE HCL 200 MG PO TABS
200.0000 mg | ORAL_TABLET | Freq: Every day | ORAL | Status: DC
  Administered 2015-09-21: 200 mg via ORAL
  Filled 2015-09-20: qty 1

## 2015-09-20 NOTE — H&P (Addendum)
History and Physical    LUX CULLINAN KDX:[id number] DOB: Aug 27, 1940 DOA: 09/20/2015  Referring MD/NP/PA:   PCP: [name], DO   Patient coming from:  The patient is coming from home.  At baseline, pt is partially dependent for his ADL.    Chief Complaint: Shortness of breath and abdominal distention  HPI: [name] Roberts is a 75 y.o. male with medical history significant of ischemic cardiomyopathy s/p ICD, CAD s/p CABG and mitral ring 2007, former smoker quit 2005, hyperlipidemia, mitral valve disease, and chronic sCHF with EF 20%, s/p of AICD, hypothyroidism, gout, anxiety, arthritis, CKD-III, who presents with shortness of breath, abdominal distention.  Pt has severe ascites secondary to sCHF. He underwent paracentesis and had 4.6 L of fluid removed on 09/18/15. Pt states that his abdominal fluid comes back quickly, and he developed shortness of breath today which is progressively getting worse. He has orthopnea. Shortness of breath is getting worse when laying down. He denies chest pain. He has mild dry cough. Has chills, but no fever. He has abdominal distention, but no nausea, vomiting, diarrhea, abdominal pain. No symptoms of UTI or unilateral weakness. He has generalized weakness.  ED Course: pt was found to have BMP 2128, lactate 1.38, INR 1.29, negative troponin, lipase 25, negative urinalysis, WBCs 8.0, temperature normal, bradycardia, Respiration rate 19, O2 sat 100% on room air, ammonia level 88, potassium of 5.6 without EKG T peaking, worsening renal function, negative chest x-ray for infiltration. Patient is admitted to telemetry bed as inpatient for further eval and treatment.  Review of Systems:   General: no fevers, has chills, no changes in body weight, has poor appetite, has fatigue HEENT: no blurry vision, hearing changes or sore throat Respiratory: has dyspnea, coughing, no wheezing CV: no chest pain, no palpitations GI: no nausea, vomiting, abdominal pain,  diarrhea, constipation. Has abdominal distention. GU: no dysuria, burning on urination, increased urinary frequency, hematuria  Ext: has leg edema Neuro: no unilateral weakness, numbness, or tingling, no vision change or hearing loss Skin: no rash MSK: No muscle spasm, no deformity, no limitation of range of movement in spin Heme: No easy bruising.  Travel history: No recent long distant travel.  Allergy:  Allergies  Allergen Reactions  . Erythromycin Base Swelling    Watery red eyes and red streaks down the face    Past Medical History:  Diagnosis Date  . Arthritis    "right thumb; posterior neck" (08/02/2013)  . CAD (coronary artery disease)   . Cardiomyopathy, ischemic   . CHF (congestive heart failure) (HCC)   . Chronic systolic heart failure (HCC)   . Gout   . Heart murmur   . History of blood transfusion    "S/P OHS"  . HLD (hyperlipidemia)   . Iron deficiency anemia    "S/P OHS"  . Myocardial infarction (HCC) 1981  . Pneumonia 12/20206    Past Surgical History:  Procedure Laterality Date  . AORTIC VALVE REPAIR  2007   "put an O ring in it"  . APPENDECTOMY    . BAND HEMORRHOIDECTOMY    . CARDIAC CATHETERIZATION  191; 2007  . CARDIAC CATHETERIZATION N/A 06/26/2015   Procedure: Right Heart Cath;  Surgeon: Laurey Morale, MD;  Location: [facility] INVASIVE CV LAB;  Service: Cardiovascular;  Laterality: N/A;  . CARDIAC DEFIBRILLATOR PLACEMENT  2007   Dr. Ladona Ridgel  . CORONARY ARTERY BYPASS GRAFT  2007   Dr. Barry Dienes  . ICD GENERATOR CHANGE  08/02/2013   "w/new  leads"  . IMPLANTABLE CARDIOVERTER DEFIBRILLATOR (ICD) GENERATOR CHANGE N/A 08/02/2013   Procedure: ICD GENERATOR CHANGE;  Surgeon: Marinus Maw, MD;  Location[facility] CATH LAB;  Service: Cardiovascular;  Laterality: N/A;  . PILONIDAL CYST EXCISION  1961    Social History:  reports that he quit smoking about 10 years ago. His smoking use included Cigarettes. He has a 140.00 pack-year smoking history. He has never used  smokeless tobacco. He reports that he drinks alcohol. He reports that he does not use drugs.  Family History:  Family History  Problem Relation Age of Onset  . Cancer Mother     brain cancer  . Heart disease Father     CAD AND CHF     Prior to Admission medications   Medication Sig Start Date End Date Taking? Authorizing Provider  allopurinol (ZYLOPRIM) 100 MG tablet TAKE 1 TABLET BY MOUTH DAILY. 05/26/15  Yes Amy Michelle Nasuti, MD  ALPRAZolam (XANAX) 1 MG tablet Take 1/2 to 1 tab nightly before bed. Patient taking differently: Take 0.5 mg by mouth at bedtime.  07/31/15  Yes Deborah Opalski, DO  amiodarone (PACERONE) 200 MG tablet Take 1 tablet (200 mg total) by mouth daily. 07/23/15  Yes Graciella Freer, PA-C  aspirin EC 81 MG tablet Take 81 mg by mouth at bedtime.    Yes Historical Provider, MD  atorvastatin (LIPITOR) 20 MG tablet Take 1 tablet (20 mg total) by mouth daily at 6 PM. 07/23/15  Yes Graciella Freer, PA-C  carvedilol (COREG) 3.125 MG tablet Take 1 tablet (3.125 mg total) by mouth 2 (two) times daily with a meal. 07/14/15  Yes Laurey Morale, MD  fluticasone [facility]) 50 MCG/ACT nasal spray Place 2 sprays into both nostrils daily. 07/31/15  Yes Deborah Opalski, DO  levothyroxine (SYNTHROID, LEVOTHROID) 50 MCG tablet Take 1 tablet (50 mcg total) by mouth daily. 08/08/15  Yes Deborah Opalski, DO  Melatonin 3 MG CAPS Take 3 mg by mouth at bedtime.   Yes Historical Provider, MD  metolazone (ZAROXOLYN) 2.5 MG tablet Take 1 tablet (2.5 mg total) by mouth once a week. Wednesdays Patient taking differently: Take 2.5 mg by mouth every Wednesday.  09/02/15  Yes Graciella Freer, PA-C  niacin (NIASPAN) 500 MG CR tablet Take 1 tablet (500 mg total) by mouth at bedtime. 06/18/14  Yes Amy Michelle Nasuti, MD  polyvinyl alcohol-povidone (REFRESH) 1.4-0.6 % ophthalmic solution Place 1 drop into the left eye daily.    Yes Historical Provider, MD  potassium chloride (K-DUR) 10 MEQ tablet Take 4  tablets (40 mEq total) by mouth 3 (three) times daily. 09/17/15 12/16/15 Yes Graciella Freer, PA-C  torsemide (DEMADEX) 20 MG tablet Take 3 tablets (60 mg total) by mouth 2 (two) times daily. 09/08/15  Yes Laurey Morale, MD    Physical Exam: Vitals:   09/20/15 1945 09/20/15 2000 09/20/15 2015 09/20/15 2030  BP: 95/64 99/66 94/69  97/68  Pulse: 60 63 64 63  Resp: 16 13 16 13   Temp:      TempSrc:      SpO2: 99% 100% 95% 98%  Weight:       General: Not in acute distress HEENT:       Eyes: PERRL, EOMI, no scleral icterus.       ENT: No discharge from the ears and nose, no pharynx injection, no tonsillar enlargement.        Neck: Positive JVD, no bruit, no mass felt. Heme: No neck lymph node  enlargement. Cardiac: S1/S2, RRR, No murmurs, No gallops or rubs. Respiratory: No rales, wheezing, rhonchi or rubs. GI: Soft, distended, nontender, no rebound pain, no organomegaly, BS present. GU: No hematuria Ext: 1+ pitting leg edema bilaterally. 2+DP/PT pulse bilaterally. Musculoskeletal: No joint deformities, No joint redness or warmth, no limitation of ROM in spin. Skin: No rashes.  Neuro: Alert, oriented X3, cranial nerves II-XII grossly intact, moves all extremities normally. Marland Kitchen. Psych: Patient is not psychotic, no suicidal or hemocidal ideation.  Labs on Admission: I have personally reviewed following labs and imaging studies  CBC:  Recent Labs Lab 09/20/15 1745  WBC 8.0  HGB 12.7*  HCT 40.0  MCV 97.1  PLT 159   Basic Metabolic Panel:  Recent Labs Lab 09/20/15 1745  NA 135  K 5.6*  CL 101  CO2 26  GLUCOSE 103*  BUN 51*  CREATININE 2.70*  CALCIUM 8.9   GFR: Estimated Creatinine Clearance: 23.2 mL/min (by C-G formula based on SCr of 2.7 mg/dL). Liver Function Tests:  Recent Labs Lab 09/20/15 1748  AST 67*  ALT 54  ALKPHOS 158*  BILITOT 1.4*  PROT 6.6  ALBUMIN 3.1*    Recent Labs Lab 09/20/15 1748  LIPASE 25    Recent Labs Lab 09/20/15 1850    AMMONIA 88*   Coagulation Profile:  Recent Labs Lab 09/20/15 1748  INR 1.29   Cardiac Enzymes:  Recent Labs Lab 09/20/15 2110  TROPONINI 0.03*   BNP (last 3 results)  Recent Labs  06/20/15 1234  PROBNP 2,636.0*   HbA1C: No results for input(s): HGBA1C in the last 72 hours. CBG: No results for input(s): GLUCAP in the last 168 hours. Lipid Profile: No results for input(s): CHOL, HDL, LDLCALC, TRIG, CHOLHDL, LDLDIRECT in the last 72 hours. Thyroid Function Tests: No results for input(s): TSH, T4TOTAL, FREET4, T3FREE, THYROIDAB in the last 72 hours. Anemia Panel: No results for input(s): VITAMINB12, FOLATE, FERRITIN, TIBC, IRON, RETICCTPCT in the last 72 hours. Urine analysis:    Component Value Date/Time   COLORURINE YELLOW 09/20/2015 1901   APPEARANCEUR CLEAR 09/20/2015 1901   LABSPEC 1.008 09/20/2015 1901   PHURINE 7.5 09/20/2015 1901   GLUCOSEU NEGATIVE 09/20/2015 1901   HGBUR NEGATIVE 09/20/2015 1901   BILIRUBINUR NEGATIVE 09/20/2015 1901   KETONESUR NEGATIVE 09/20/2015 1901   PROTEINUR NEGATIVE 09/20/2015 1901   NITRITE NEGATIVE 09/20/2015 1901   LEUKOCYTESUR NEGATIVE 09/20/2015 1901   Sepsis Labs: @LABRCNTIP (procalcitonin:4,lacticidven:4) )No results found for this or any previous visit (from the past 240 hour(s)).   Radiological Exams on Admission: Dg Chest 2 View  Result Date: 09/20/2015 CLINICAL DATA:  Shortness of Breath starting today. EXAM: CHEST  2 VIEW COMPARISON:  05/31/2015. FINDINGS: Lungs are hyperexpanded. The lungs are clear wiithout focal pneumonia, edema, pneumothorax or pleural effusion. The cardio pericardial silhouette is enlarged. Status post CABG and cardiac valve replacement. Left pacer/AICD remains in place. The visualized bony structures of the thorax are intact. IMPRESSION: No acute cardiopulmonary findings. Electronically Signed   By: Kennith CenterEric  Mansell M.D.   On: 09/20/2015 17:38     EKG: Independently reviewed.  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 His abdominal distention secondary to severe ascites has also contributed partially. 2-D echo on 06/21/15 showed EF of 20%. S/p of AICD.   -will will admit to tele bed for obs as inpt -Lasix 80 mg bid by IV -trop x 3 -2d echo -will continue ASA -Decreased dose of Coreg from 3.125-->1.625 mg twice a day -Daily weights -strict I/O's and fluid restriction -Low salt diet -No ACEI due to worsening renal function. -please consult to cardiology of heart failure team in AM  AoCKD-III: Baseline Cre is 2.20, pt's Cre 2.7 and BUN 51 on admission. Likely due to combination of cardiorenal and prerenal failure.  -pt will be treated with escalated diuretics as above, with understanding that this may worsen his renal function. However, I am hoping this will not happen due to the Starling mechanism. Her kidney may be better perfused with improved cardiac function - Check FeUrea - Follow up renal function by BMP  Hyperkalemia: Potassium 5.6 without EKG change. -Kayexalate 15 mg 1 -Expecting correction with diuretics  CAD: s/o of CABG. No CP. -Continue aspirin, Lipitor, Coreg -Follow-up troponin 3  Abdominal distention and ascites: most likely due to CHF. He has elvevted ammonium of 88, which can be explained  by hepatic congestion 2/2 CHF. Patient does not have fever, leukocytosis, abdominal pain, less likely to have SBP. -will give low dose of lactulose, 10 g twice a day -Check hepatitis panel -May consider paracentesis again if not responding to diuretics  HLD: Last LDL was 31 on 06/11/15 -Continue home medications: Lipitor  Hypothyroidism: Last TSH was 32.988 7/ 14/17 -Continue home Synthroid -Check TSH  Gout: -continue home allopurinol  Anxiety: Stable, no suicidal or homicidal ideations. -Continue home medications: Xanax  DVT ppx: (Pt has worsening CKD-III, sq heparin is better choice than sq Lovenox) Code Status: partial code (OK with CPR, but not intubation) Family Communication:  Yes, patient's wife and daghter at bed side Disposition Plan:  Anticipate discharge back to previous home environment Consults called:  nonw Admission status:  Inpatient/tele  Date of Service 09/20/2015    Lorretta Harp [facility] Pager [phone]  If 7PM-7AM, please contact night-coverage [email or web address] Password [email or web address] 09/20/2015, 10:14 PM

## 2015-09-20 NOTE — ED Notes (Signed)
Report attempted x 1

## 2015-09-20 NOTE — ED Triage Notes (Addendum)
Pt.s family member reports that pt. Has a hx of CHF and had 4.5L  Of fluid removed from his abdomen in June.  He developed sob 2 hours ago,.  His abdomen is distended.  He denies any chest pain or pressure.  Skin is pinik, warm and dry. Pt. Is talking in full sentences

## 2015-09-20 NOTE — ED Provider Notes (Signed)
[facility]-EMERGENCY DEPT Provider Note   CSN: [id number] Arrival date & time: 09/20/15  1638     History   Chief Complaint Chief Complaint  Patient presents with  . Shortness of Breath    HPI [name] Roberts is a 75 y.o. male.  HPI Patient had paracentesis of 4.5 L day before yesterday. That is his sixth paracentesis since early June. Patient had been feeling fairly well earlier today. He had been outside doing some light activities. He came inside the house and started to feel very chilled. He then went back outside to try to warm up but still didn't feel warm and felt unwell. He then went in the house to lie down. He states that he started becoming very short of breath. All these symptoms have come on fairly quickly over several hours duration. Patient has not had documented fever. He denies any area of pain. Patient's wife however notes that the distention and swelling of his abdomen seemed to come back very quickly. She reports his abdomen seems swollen again today. Shortness of breath is worse with lying flat. Past Medical History:  Diagnosis Date  . Arthritis    "right thumb; posterior neck" (08/02/2013)  . CAD (coronary artery disease)   . Cardiomyopathy, ischemic   . CHF (congestive heart failure) (HCC)   . Chronic systolic heart failure (HCC)   . Gout   . Heart murmur   . History of blood transfusion    "S/P OHS"  . HLD (hyperlipidemia)   . Iron deficiency anemia    "S/P OHS"  . Myocardial infarction (HCC) 1981  . Pneumonia 12/20206    Patient Active Problem List   Diagnosis Date Noted  . Cirrhosis, cardiac 10/04/2015  . Anxiety state 09/20/2015  . Gout 09/20/2015  . Hyperkalemia 09/20/2015  . Loss of consciousness 09/11/2015  . Hypothyroidism 08/08/2015  . Sleep difficulties 08/02/2015  . h/o AMI 08/02/2015  . Dysphagia- diff swallowing solids and liquids 08/02/2015  . Arthritis 07/12/2015  . HLD (hyperlipidemia) 07/12/2015  . h/o Iron deficiency anemia  07/12/2015  . Acute on chronic kidney failure (HCC) 07/08/2015  . Severe left ventricular systolic dysfunction   . 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 h/o Syncope 06/20/2015  . Normocytic anemia 06/20/2015  . Peripheral edema 09/27/2014  . Ascites 09/27/2014  . Abdominal distention- due to CHF 09/27/2014  . H/o Herpes zoster 07/23/2014  . Counseling regarding end of life decision making 06/18/2014  . Elevated transaminase level 06/13/2012  . CARDIOMYOPATHY, ISCHEMIC 09/05/2008  . Hyperlipidemia 03/07/2008  . Gout of multiple sites 03/07/2008  . CAD  (Dr Reatha Harps) 03/07/2008  . Acute on chronic systolic (congestive) heart failure (HCC) 03/07/2008  . Automatic implantable cardioverter-defibrillator in situ (Dr Ladona Ridgel- EP-Cards) 03/07/2008  . Personal history of tobacco use 04/26/1955    Past Surgical History:  Procedure Laterality Date  . AORTIC VALVE REPAIR  2007   "put an O ring in it"  . APPENDECTOMY    . BAND HEMORRHOIDECTOMY    . CARDIAC CATHETERIZATION  191; 2007  . CARDIAC CATHETERIZATION N/A 06/26/2015   Procedure: Right Heart Cath;  Surgeon: Laurey Morale, MD;  Location: [facility] INVASIVE CV LAB;  Service: Cardiovascular;  Laterality: N/A;  . CARDIAC CATHETERIZATION N/A 09/23/2015   Procedure: Right Heart Cath;  Surgeon: Laurey Morale, MD;  Location: [facility] INVASIVE CV LAB;  Service: Cardiovascular;  Laterality: N/A;  . CARDIAC DEFIBRILLATOR PLACEMENT  2007   Dr. Ladona Ridgel  .  CORONARY ARTERY BYPASS GRAFT  2007   Dr. Barry Dieneswens  . ICD GENERATOR CHANGE  08/02/2013   "w/new leads"  . IMPLANTABLE CARDIOVERTER DEFIBRILLATOR (ICD) GENERATOR CHANGE N/A 08/02/2013   Procedure: ICD GENERATOR CHANGE;  Surgeon: Marinus MawGregg W Taylor, MD;  [facility];  Service: Cardiovascular;  Laterality: N/A;  . PILONIDAL CYST EXCISION  1961       Home Medications    Prior to Admission medications   Medication Sig Start Date End Date Taking? Authorizing Provider   allopurinol (ZYLOPRIM) 100 MG tablet TAKE 1 TABLET BY MOUTH DAILY. 05/26/15  Yes Amy Michelle NasutiE Bedsole, MD  aspirin EC 81 MG tablet Take 81 mg by mouth at bedtime.    Yes Historical Provider, MD  atorvastatin (LIPITOR) 20 MG tablet Take 1 tablet (20 mg total) by mouth daily at 6 PM. 07/23/15  Yes Graciella FreerMichael Andrew Tillery, PA-C  fluticasone [facility](FLONASE) 50 MCG/ACT nasal spray Place 2 sprays into both nostrils daily. 07/31/15  Yes Deborah Opalski, DO  Melatonin 3 MG CAPS Take 3 mg by mouth at bedtime.   Yes Historical Provider, MD  niacin (NIASPAN) 500 MG CR tablet Take 1 tablet (500 mg total) by mouth at bedtime. 06/18/14  Yes Amy Michelle NasutiE Bedsole, MD  polyvinyl alcohol-povidone (REFRESH) 1.4-0.6 % ophthalmic solution Place 1 drop into the left eye daily.    Yes Historical Provider, MD  ALPRAZolam Prudy Feeler(XANAX) 0.5 MG tablet Take 1/2 to 1 tab nightly before bed. 10/04/15   Thomasene Loteborah Opalski, DO  amiodarone (PACERONE) 200 MG tablet Take 0.5 tablets (100 mg total) by mouth daily. 09/24/15   Graciella FreerMichael Andrew Tillery, PA-C  carvedilol (COREG) 3.125 MG tablet Take 0.5 tablets (1.5625 mg total) by mouth 2 (two) times daily with a meal. 09/24/15   Graciella FreerMichael Andrew Tillery, PA-C  Digoxin 62.5 MCG TABS Take 0.0625 mg by mouth every other day. 09/30/15   Laurey Moralealton S McLean, MD  lactulose (CHRONULAC) 10 GM/15ML solution Take 30 mLs (20 g total) by mouth 3 (three) times daily. 09/24/15   Graciella FreerMichael Andrew Tillery, PA-C  levothyroxine (SYNTHROID, LEVOTHROID) 50 MCG tablet Take 1.5 tablets (75 mcg total) by mouth daily. 09/24/15   Shanker Levora DredgeM Ghimire, MD  metolazone (ZAROXOLYN) 2.5 MG tablet As directed by [facility] 09/24/15   Graciella FreerMichael Andrew Tillery, PA-C  spironolactone (ALDACTONE) 25 MG tablet Take 1 tablet (25 mg total) by mouth 2 (two) times daily. 09/24/15   Graciella FreerMichael Andrew Tillery, PA-C  torsemide [facility](DEMADEX) 20 MG tablet Take 3 tabs in AM and 2 tabs in PM 09/30/15   Laurey Moralealton S McLean, MD    Family History Family History  Problem Relation Age of Onset    . Cancer Mother     brain cancer  . Heart disease Father     CAD AND CHF    Social History Social History  Substance Use Topics  . Smoking status: Former Smoker    Packs/day: 2.50    Years: 56.00    Types: Cigarettes    Quit date: 01/18/2005  . Smokeless tobacco: Never Used  . Alcohol use 0.0 oz/week     Comment: Rare     Allergies   Erythromycin base   Review of Systems Review of Systems 10 Systems reviewed and are negative for acute change except as noted in the HPI.  Physical Exam Updated Vital Signs BP (!) 89/53 (BP Location: Left Arm)   Pulse 62   Temp 97.9 F (36.6 C) (Oral)   Resp 18   Ht 5'  6" (1.676 m)   Wt 144 lb 3.2 oz (65.4 kg) Comment: scale c  SpO2 99%   BMI 23.27 kg/m   Physical Exam  Constitutional: He is oriented to person, place, and time.  Patient appears deconditioned and fatigued. Color slightly pale. He is alert and nontoxic. He does not have significant respiratory distress at rest but appears generally uncomfortable.  HENT:  Head: Normocephalic and atraumatic.  Eyes: EOM are normal.  Neck: JVD present.  Cardiovascular:  2\6 systolic ejection murmur. Distant heart sounds.  Pulmonary/Chest:  Airflow to mid to lower lung fields on the right without gross rhonchi wheeze. Crackles left base to mid lung field.  Abdominal:  Abdomen is distended. Findings consistent with ascites. Soft nontender.  Musculoskeletal: He exhibits edema.  Patient has 2+ pitting edema left lower extremity and 1+ pitting edema right lower extremity. No calf tenderness.  Neurological: He is alert and oriented to person, place, and time. He exhibits normal muscle tone. Coordination normal.  Skin: Skin is warm and dry. There is pallor.  Psychiatric: He has a normal mood and affect.     ED Treatments / Results  Labs (all labs ordered are listed, but only abnormal results are displayed) Labs Reviewed  BASIC METABOLIC PANEL - Abnormal; Notable for the following:        Result Value   Potassium 5.6 (*)    Glucose, Bld 103 (*)    BUN 51 (*)    Creatinine, Ser 2.70 (*)    GFR calc non Af Amer 22 (*)    GFR calc Af Amer 25 (*)    All other components within normal limits  CBC - Abnormal; Notable for the following:    RBC 4.12 (*)    Hemoglobin 12.7 (*)    RDW 17.0 (*)    All other components within normal limits  BRAIN NATRIURETIC PEPTIDE - Abnormal; Notable for the following:    B Natriuretic Peptide 2,128.2 (*)    All other components within normal limits  HEPATIC FUNCTION PANEL - Abnormal; Notable for the following:    Albumin 3.1 (*)    AST 67 (*)    Alkaline Phosphatase 158 (*)    Total Bilirubin 1.4 (*)    All other components within normal limits  PROTIME-INR - Abnormal; Notable for the following:    Prothrombin Time 16.1 (*)    All other components within normal limits  AMMONIA - Abnormal; Notable for the following:    Ammonia 88 (*)    All other components within normal limits  TROPONIN I - Abnormal; Notable for the following:    Troponin I 0.03 (*)    All other components within normal limits  BASIC METABOLIC PANEL - Abnormal; Notable for the following:    BUN 48 (*)    Creatinine, Ser 2.59 (*)    Calcium 8.6 (*)    GFR calc non Af Amer 23 (*)    GFR calc Af Amer 26 (*)    All other components within normal limits  TSH - Abnormal; Notable for the following:    TSH 39.379 (*)    All other components within normal limits  BASIC METABOLIC PANEL - Abnormal; Notable for the following:    Potassium 3.1 (*)    Chloride 99 (*)    BUN 45 (*)    Creatinine, Ser 2.43 (*)    Calcium 8.6 (*)    GFR calc non Af Amer 25 (*)    GFR calc Af Denyse Dago  29 (*)    All other components within normal limits  MAGNESIUM - Abnormal; Notable for the following:    Magnesium 2.6 (*)    All other components within normal limits  BASIC METABOLIC PANEL - Abnormal; Notable for the following:    BUN 38 (*)    Creatinine, Ser 2.01 (*)    Calcium 8.6 (*)     GFR calc non Af Amer 31 (*)    GFR calc Af Amer 36 (*)    All other components within normal limits  MAGNESIUM - Abnormal; Notable for the following:    Magnesium 2.5 (*)    All other components within normal limits  CBC - Abnormal; Notable for the following:    RBC 3.77 (*)    Hemoglobin 11.5 (*)    HCT 35.8 (*)    RDW 17.1 (*)    Platelets 146 (*)    All other components within normal limits  MAGNESIUM - Abnormal; Notable for the following:    Magnesium 2.6 (*)    All other components within normal limits  BASIC METABOLIC PANEL - Abnormal; Notable for the following:    BUN 37 (*)    Creatinine, Ser 2.06 (*)    GFR calc non Af Amer 30 (*)    GFR calc Af Amer 35 (*)    All other components within normal limits  CBC - Abnormal; Notable for the following:    RBC 4.01 (*)    Hemoglobin 12.4 (*)    HCT 38.3 (*)    RDW 16.9 (*)    Platelets 149 (*)    All other components within normal limits  AMMONIA - Abnormal; Notable for the following:    Ammonia 91 (*)    All other components within normal limits  POCT I-STAT 3, VENOUS BLOOD GAS (G3P V) - Abnormal; Notable for the following:    pH, Ven 7.448 (*)    pCO2, Ven 39.9 (*)    pO2, Ven 28.0 (*)    Acid-Base Excess 3.0 (*)    All other components within normal limits  POCT I-STAT 3, VENOUS BLOOD GAS (G3P V) - Abnormal; Notable for the following:    pH, Ven 7.447 (*)    pCO2, Ven 40.0 (*)    pO2, Ven 28.0 (*)    Acid-Base Excess 3.0 (*)    All other components within normal limits  CULTURE, BLOOD (ROUTINE X 2)  CULTURE, BLOOD (ROUTINE X 2)  LIPASE, BLOOD  URINALYSIS, ROUTINE W REFLEX MICROSCOPIC (NOT AT [facility])  HEPATITIS PANEL, ACUTE  CREATININE, URINE, RANDOM  UREA NITROGEN, URINE  TROPONIN I  TROPONIN I  MAGNESIUM  I-STAT TROPOININ, ED  I-STAT CG4 LACTIC ACID, ED    EKG  EKG Interpretation  Date/Time:  Saturday September 20 2015 16:47:00 EDT Ventricular Rate:  56 PR Interval:  254 QRS Duration: 150 QT  Interval:  548 QTC Calculation: 528 R Axis:   155 Text Interpretation: Suspect arm lead reversal, interpretation assumes no reversal Unusual P axis, possible ectopic atrial bradycardia Right bundle branch block Left posterior fascicular block Bifascicular block  T wave abnormality, consider inferior ischemia Abnormal ECG No significant change since last tracing Confirmed by KNAPP  MD-J, JON ([id number]) on 09/20/2015 4:56:46 PM       Radiology No results found.  Procedures Procedures (including critical care time)  Medications Ordered in ED Medications  lidocaine (XYLOCAINE) 1 % (with pres) injection (not administered)  sodium polystyrene (KAYEXALATE) 15 GM/60ML suspension 15 g (15  g Oral Given 09/20/15 2314)  potassium chloride SA (K-DUR,KLOR-CON) CR tablet 20 mEq (20 mEq Oral Given 09/22/15 1101)  potassium chloride SA (K-DUR,KLOR-CON) CR tablet 40 mEq (40 mEq Oral Given 09/22/15 0847)  aspirin chewable tablet 81 mg (81 mg Oral Given 09/23/15 0621)  metolazone (ZAROXOLYN) tablet 2.5 mg (2.5 mg Oral Given 09/23/15 0751)     Initial Impression / Assessment and Plan / ED Course  I have reviewed the triage vital signs and the nursing notes.  Pertinent labs & imaging results that were available during my care of the patient were reviewed by me and considered in my medical decision making (see chart for details).  Clinical Course   Hospital admission to [facility] hospitalist  Final Clinical Impressions(s) / ED Diagnoses   Final diagnoses:  Ascites  Heart failure, unspecified (HCC)  Systolic CHF  (Dr Marca Ancona)    New Prescriptions Discharge Medication List as of 09/24/2015  1:57 PM    START taking these medications   Details  lactulose (CHRONULAC) 10 GM/15ML solution Take 30 mLs (20 g total) by mouth 3 (three) times daily., Starting Wed 09/24/2015, Normal    spironolactone (ALDACTONE) 25 MG tablet Take 1 tablet (25 mg total) by mouth 2 (two) times daily., Starting Wed 09/24/2015,  Normal    digoxin 62.5 MCG TABS Take 0.0625 mg by mouth daily., Starting Wed 09/24/2015, Normal         Arby Barrette, MD 10/14/15 2017

## 2015-09-21 ENCOUNTER — Encounter (HOSPITAL_COMMUNITY): Payer: Self-pay

## 2015-09-21 DIAGNOSIS — E032 Hypothyroidism due to medicaments and other exogenous substances: Secondary | ICD-10-CM

## 2015-09-21 DIAGNOSIS — I5023 Acute on chronic systolic (congestive) heart failure: Principal | ICD-10-CM

## 2015-09-21 DIAGNOSIS — E785 Hyperlipidemia, unspecified: Secondary | ICD-10-CM

## 2015-09-21 DIAGNOSIS — N179 Acute kidney failure, unspecified: Secondary | ICD-10-CM

## 2015-09-21 DIAGNOSIS — Z95810 Presence of automatic (implantable) cardiac defibrillator: Secondary | ICD-10-CM

## 2015-09-21 DIAGNOSIS — N183 Chronic kidney disease, stage 3 (moderate): Secondary | ICD-10-CM

## 2015-09-21 DIAGNOSIS — E875 Hyperkalemia: Secondary | ICD-10-CM

## 2015-09-21 DIAGNOSIS — E039 Hypothyroidism, unspecified: Secondary | ICD-10-CM

## 2015-09-21 DIAGNOSIS — R140 Abdominal distension (gaseous): Secondary | ICD-10-CM

## 2015-09-21 DIAGNOSIS — R188 Other ascites: Secondary | ICD-10-CM

## 2015-09-21 DIAGNOSIS — I2510 Atherosclerotic heart disease of native coronary artery without angina pectoris: Secondary | ICD-10-CM

## 2015-09-21 LAB — BASIC METABOLIC PANEL
Anion gap: 7 (ref 5–15)
BUN: 48 mg/dL — AB (ref 6–20)
CHLORIDE: 104 mmol/L (ref 101–111)
CO2: 25 mmol/L (ref 22–32)
CREATININE: 2.59 mg/dL — AB (ref 0.61–1.24)
Calcium: 8.6 mg/dL — ABNORMAL LOW (ref 8.9–10.3)
GFR calc Af Amer: 26 mL/min — ABNORMAL LOW (ref >60)
GFR calc non Af Amer: 23 mL/min — ABNORMAL LOW (ref >60)
Glucose, Bld: 88 mg/dL (ref 65–99)
Potassium: 4.3 mmol/L (ref 3.5–5.1)
SODIUM: 136 mmol/L (ref 135–145)

## 2015-09-21 LAB — TSH: TSH: 39.379 u[IU]/mL — ABNORMAL HIGH (ref 0.350–4.500)

## 2015-09-21 LAB — CREATININE, URINE, RANDOM: CREATININE, URINE: 35.37 mg/dL

## 2015-09-21 LAB — TROPONIN I
Troponin I: <0.03 ng/mL (ref <0.03)
Troponin I: <0.03 ng/mL (ref <0.03)

## 2015-09-21 LAB — MAGNESIUM: Magnesium: 2.4 mg/dL (ref 1.7–2.4)

## 2015-09-21 MED ORDER — LACTULOSE 10 GM/15ML PO SOLN
20.0000 g | Freq: Three times a day (TID) | ORAL | Status: DC
  Administered 2015-09-21 – 2015-09-24 (×7): 20 g via ORAL
  Filled 2015-09-21 (×8): qty 30
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
## 2015-09-21 NOTE — Progress Notes (Signed)
PROGRESS NOTE        PATIENT DETAILS Name: [name] Roberts Age: 75 y.o. Sex: male Date of Birth: 10/10/1940 Admit Date: 09/20/2015 Admitting Physician Lorretta Harp, MD PFY:[id number] Opalski, DO  Brief Narrative: Patient is a 75 y.o. male with history of chronic systolic heart failure secondary to ischemic cardiomyopathy (EF 20% by echo on May 2017), history of CAD status post CABG, chronic kidney disease stage III admitted with acute on chronic systolic heart failure.  Subjective: Shortness of breath better, claims ascites has again reaccumulated-just had paracentesis a few days back.  Assessment/Plan: Principal Problem:  Acute on chronic systolic (congestive) heart failure(EF 20% by echo on May 2017): Feels better-Continue Lasix, Coreg, weight down to 151 pounds. CHF/cardiology consultation called.  Active Problems: Acute on chronic kidney disease stage III: Likely secondary to above, cautiously continue diuretics-blood pressure soft. Follow electrolytes.  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 May need another paracentesis prior to discharge.  CAD status post CABG: No chest pain, continue aspirin, Coreg and statin.  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  Mitral Valve Disease: S/P Mitral Valve Ring 2007  Automatic implantable cardioverter-defibrillator in situ (Dr Ladona Ridgel- EP-Cards)  Hypothyroidism: Continue levothyroxine  Anxiety: Continue Xanax  DVT Prophylaxis: Prophylactic Heparin   Code Status:  DNI  Family Communication: None at bedside  Disposition Plan: Remain inpatient-home in the next 1-2 days  Antimicrobial agents: None  Procedures: None  CONSULTS:  cardiology  Time spent: 25- minutes-Greater than 50% of this time was spent in counseling, explanation of diagnosis, planning of further management, and coordination of care.  MEDICATIONS: Anti-infectives    None       Scheduled Meds: . allopurinol  100 mg Oral Daily  . ALPRAZolam  0.5 mg Oral QHS  . amiodarone  200 mg Oral Daily  . aspirin EC  81 mg Oral QHS  . atorvastatin  20 mg Oral q1800  . carvedilol  1.5625 mg Oral BID WC  . fluticasone  2 spray Each Nare Daily  . furosemide  80 mg Intravenous BID  . heparin  5,000 Units Subcutaneous Q8H  . lactulose  10 g Oral BID  . levothyroxine  50 mcg Oral QAC breakfast  . Melatonin  3 mg Oral QHS  . niacin  500 mg Oral QHS  . polyvinyl alcohol  1 drop Both Eyes Daily  . sodium chloride flush  3 mL Intravenous Q12H   Continuous Infusions:  PRN Meds:.sodium chloride, acetaminophen, sodium chloride flush   PHYSICAL EXAM: Vital signs: Vitals:   09/20/15 2204 09/21/15 0534 09/21/15 0810 09/21/15 1018  BP: (!) 98/58 (!) 89/50 (!) 86/52 98/69  Pulse: 66 61 (!) 54 (!) 58  Resp: 16 17 18 18   Temp: 98.3 F (36.8 C) 97.6 F (36.4 C)    TempSrc: Oral Oral    SpO2: 100% 100% 94% 100%  Weight: 70.4 kg (155 lb 3.3 oz) 68.5 kg (151 lb 1.6 oz)    Height: 5\' 6"  (1.676 m)      Filed Weights   09/20/15 1650 09/20/15 2204 09/21/15 0534  Weight: 69.9 kg (154 lb) 70.4 kg (155 lb 3.3 oz) 68.5 kg (151 lb 1.6 oz)   Body mass index is 24.39 kg/m.   Gen Exam: Awake and alert with clear speech. Not in any distress * Neck: Supple, No JVD.   Chest:  Few bibasilar rales CVS: S1 S2 Regular,Plus 3/6 systolic murmur Abdomen: soft, BS +, non tender, distended with ascites Extremities: no edema, lower extremities warm to touch Neurologic: Non Focal.   Skin: No Rash or lesions   Wounds: N/A.    I have personally reviewed following labs and imaging studies  LABORATORY DATA: CBC:  Recent Labs Lab 09/20/15 1745  WBC 8.0  HGB 12.7*  HCT 40.0  MCV 97.1  PLT 159    Basic Metabolic Panel:  Recent Labs Lab 09/20/15 1745 09/21/15 0819  NA 135 136  K 5.6* 4.3  CL 101 104  CO2 26 25  GLUCOSE 103* 88  BUN 51* 48*  CREATININE 2.70* 2.59*  CALCIUM 8.9  8.6*  MG  --  2.4    GFR: Estimated Creatinine Clearance: 22.6 mL/min (by C-G formula based on SCr of 2.59 mg/dL).  Liver Function Tests:  Recent Labs Lab 09/20/15 1748  AST 67*  ALT 54  ALKPHOS 158*  BILITOT 1.4*  PROT 6.6  ALBUMIN 3.1*    Recent Labs Lab 09/20/15 1748  LIPASE 25    Recent Labs Lab 09/20/15 1850  AMMONIA 88*    Coagulation Profile:  Recent Labs Lab 09/20/15 1748  INR 1.29    Cardiac Enzymes:  Recent Labs Lab 09/20/15 2110 09/21/15 0247 09/21/15 0819  TROPONINI 0.03* <0.03 <0.03    BNP (last 3 results)  Recent Labs  06/20/15 1234  PROBNP 2,636.0*    HbA1C: No results for input(s): HGBA1C in the last 72 hours.  CBG: No results for input(s): GLUCAP in the last 168 hours.  Lipid Profile: No results for input(s): CHOL, HDL, LDLCALC, TRIG, CHOLHDL, LDLDIRECT in the last 72 hours.  Thyroid Function Tests:  Recent Labs  09/21/15 0247  TSH 39.379*    Anemia Panel: No results for input(s): VITAMINB12, FOLATE, FERRITIN, TIBC, IRON, RETICCTPCT in the last 72 hours.  Urine analysis:    Component Value Date/Time   COLORURINE YELLOW 09/20/2015 1901   APPEARANCEUR CLEAR 09/20/2015 1901   LABSPEC 1.008 09/20/2015 1901   PHURINE 7.5 09/20/2015 1901   GLUCOSEU NEGATIVE 09/20/2015 1901   HGBUR NEGATIVE 09/20/2015 1901   BILIRUBINUR NEGATIVE 09/20/2015 1901   KETONESUR NEGATIVE 09/20/2015 1901   PROTEINUR NEGATIVE 09/20/2015 1901   NITRITE NEGATIVE 09/20/2015 1901   LEUKOCYTESUR NEGATIVE 09/20/2015 1901    Sepsis Labs: Lactic Acid, Venous    Component Value Date/Time   LATICACIDVEN 1.38 09/20/2015 1857    MICROBIOLOGY: No results found for this or any previous visit (from the past 240 hour(s)).  RADIOLOGY STUDIES/RESULTS: Dg Chest 2 View  Result Date: 09/20/2015 CLINICAL DATA:  Shortness of Breath starting today. EXAM: CHEST  2 VIEW COMPARISON:  05/31/2015. FINDINGS: Lungs are hyperexpanded. The lungs are clear  wiithout focal pneumonia, edema, pneumothorax or pleural effusion. The cardio pericardial silhouette is enlarged. Status post CABG and cardiac valve replacement. Left pacer/AICD remains in place. The visualized bony structures of the thorax are intact. IMPRESSION: No acute cardiopulmonary findings. Electronically Signed   By: Kennith CenterEric  Mansell M.D.   On: 09/20/2015 17:38   [place] Paracentesis  Result Date: 09/18/2015 INDICATION: CHF, recurrent ascites.  Request made for therapeutic paracentesis. EXAM: ULTRASOUND GUIDED THERAPEUTIC PARACENTESIS MEDICATIONS: None. COMPLICATIONS: None immediate. PROCEDURE: Informed written consent was obtained from the patient after a discussion of the risks, benefits and alternatives to treatment. A timeout was performed prior to the initiation of the procedure. Initial ultrasound scanning demonstrates a moderate-to-large amount of ascites within the left  lower abdominal quadrant. The left lower abdomen was prepped and draped in the usual sterile fashion. 1% lidocaine was used for local anesthesia. Following this, a Yueh catheter was introduced. An ultrasound image was saved for documentation purposes. The paracentesis was performed. The catheter was removed and a dressing was applied. The patient tolerated the procedure well without immediate post procedural complication. FINDINGS: A total of approximately 4.6 liters of slightly hazy, amber fluid was removed. IMPRESSION: Successful ultrasound-guided therapeutic paracentesis yielding 4.6 liters of peritoneal fluid. Read by: Jeananne Rama, PA-C Electronically Signed   By: Jolaine Click M.D.   On: 09/18/2015 11:56   US Paracentesis  Result Date: 08/26/2015 INDICATION: CHF, recurrent ascites.  Request made for therapeutic paracentesis. EXAM: ULTRASOUND GUIDED THERAPEUTIC PARACENTESIS MEDICATIONS: None. COMPLICATIONS: None immediate. PROCEDURE: Informed written consent was obtained from the patient after a discussion of the risks, benefits  and alternatives to treatment. A timeout was performed prior to the initiation of the procedure. Initial ultrasound scanning demonstrates a moderate-to-large amount of ascites within the left lower abdominal quadrant. The left lower abdomen was prepped and draped in the usual sterile fashion. 1% lidocaine was used for local anesthesia. Following this, a Yueh catheter was introduced. An ultrasound image was saved for documentation purposes. The paracentesis was performed. The catheter was removed and a dressing was applied. The patient tolerated the procedure well without immediate post procedural complication. FINDINGS: A total of approximately 4.6 liters of slightly hazy, amber fluid was removed. IMPRESSION: Successful ultrasound-guided therapeutic paracentesis yielding 4.6 liters of peritoneal fluid. Read by: Jeananne Rama, PA-C Electronically Signed   By: Richarda Overlie M.D.   On: 08/26/2015 11:33     LOS: 1 day   Jeoffrey Massed, MD  [facility] Pager:[phone]  If 7PM-7AM, please contact night-coverage [email or web address] Password [facility] 09/21/2015, 10:37 AM
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
## 2015-09-22 ENCOUNTER — Inpatient Hospital Stay (HOSPITAL_COMMUNITY): Payer: Medicare HMO

## 2015-09-22 ENCOUNTER — Other Ambulatory Visit (HOSPITAL_COMMUNITY): Payer: Medicare HMO

## 2015-09-22 DIAGNOSIS — I509 Heart failure, unspecified: Secondary | ICD-10-CM

## 2015-09-22 DIAGNOSIS — E038 Other specified hypothyroidism: Secondary | ICD-10-CM

## 2015-09-22 LAB — ECHOCARDIOGRAM COMPLETE
Height: 66 in
WEIGHTICAEL: 2447.99 [oz_av]

## 2015-09-22 LAB — MAGNESIUM: Magnesium: 2.6 mg/dL — ABNORMAL HIGH (ref 1.7–2.4)

## 2015-09-22 LAB — BASIC METABOLIC PANEL
Anion gap: 10 (ref 5–15)
BUN: 45 mg/dL — ABNORMAL HIGH (ref 6–20)
CHLORIDE: 99 mmol/L — AB (ref 101–111)
CO2: 26 mmol/L (ref 22–32)
CREATININE: 2.43 mg/dL — AB (ref 0.61–1.24)
Calcium: 8.6 mg/dL — ABNORMAL LOW (ref 8.9–10.3)
GFR calc non Af Amer: 25 mL/min — ABNORMAL LOW (ref >60)
GFR, EST AFRICAN AMERICAN: 29 mL/min — AB (ref >60)
Glucose, Bld: 98 mg/dL (ref 65–99)
Potassium: 3.1 mmol/L — ABNORMAL LOW (ref 3.5–5.1)
Sodium: 135 mmol/L (ref 135–145)

## 2015-09-22 LAB — UREA NITROGEN, URINE: Urea Nitrogen, Ur: 261 mg/dL

## 2015-09-22 MED ORDER — POTASSIUM CHLORIDE CRYS ER 20 MEQ PO TBCR
20.0000 meq | EXTENDED_RELEASE_TABLET | Freq: Once | ORAL | Status: AC
  Administered 2015-09-22: 20 meq via ORAL
  Filled 2015-09-22: qty 1

## 2015-09-22 MED ORDER — ASPIRIN 81 MG PO CHEW
81.0000 mg | CHEWABLE_TABLET | ORAL | Status: AC
  Administered 2015-09-23: 81 mg via ORAL
  Filled 2015-09-22: qty 1

## 2015-09-22 MED ORDER — LEVOTHYROXINE SODIUM 75 MCG PO TABS
75.0000 ug | ORAL_TABLET | Freq: Every day | ORAL | Status: DC
  Administered 2015-09-22 – 2015-09-24 (×3): 75 ug via ORAL
  Filled 2015-09-22 (×4): qty 1

## 2015-09-22 MED ORDER — LIDOCAINE HCL 1 % IJ SOLN
INTRAMUSCULAR | Status: AC
  Filled 2015-09-22: qty 20

## 2015-09-22 MED ORDER — POTASSIUM CHLORIDE CRYS ER 20 MEQ PO TBCR
40.0000 meq | EXTENDED_RELEASE_TABLET | Freq: Two times a day (BID) | ORAL | Status: DC
  Administered 2015-09-22 – 2015-09-24 (×4): 40 meq via ORAL
  Filled 2015-09-22 (×4): qty 2

## 2015-09-22 MED ORDER — AMIODARONE HCL 100 MG PO TABS
100.0000 mg | ORAL_TABLET | Freq: Every day | ORAL | Status: DC
  Administered 2015-09-22 – 2015-09-24 (×3): 100 mg via ORAL
  Filled 2015-09-22 (×3): qty 1

## 2015-09-22 MED ORDER — SODIUM CHLORIDE 0.9 % IV SOLN: INTRAVENOUS | Status: DC

## 2015-09-22 MED ORDER — SODIUM CHLORIDE 0.9% FLUSH
3.0000 mL | INTRAVENOUS | Status: DC | PRN
  Administered 2015-09-23: 3 mL via INTRAVENOUS
  Filled 2015-09-22: qty 3

## 2015-09-22 MED ORDER — FUROSEMIDE 10 MG/ML IJ SOLN
80.0000 mg | Freq: Two times a day (BID) | INTRAMUSCULAR | Status: DC
  Administered 2015-09-22 – 2015-09-24 (×5): 80 mg via INTRAVENOUS
  Filled 2015-09-22 (×4): qty 8

## 2015-09-22 MED ORDER — SPIRONOLACTONE 25 MG PO TABS
25.0000 mg | ORAL_TABLET | Freq: Every day | ORAL | Status: DC
  Administered 2015-09-22: 25 mg via ORAL
  Filled 2015-09-22: qty 1

## 2015-09-22 MED ORDER — SODIUM CHLORIDE 0.9% FLUSH: 3.0000 mL | Freq: Two times a day (BID) | INTRAVENOUS | Status: DC

## 2015-09-22 MED ORDER — SODIUM CHLORIDE 0.9 % IV SOLN: 250.0000 mL | INTRAVENOUS | Status: DC | PRN

## 2015-09-22 MED ORDER — POTASSIUM CHLORIDE CRYS ER 20 MEQ PO TBCR
40.0000 meq | EXTENDED_RELEASE_TABLET | Freq: Once | ORAL | Status: AC
  Administered 2015-09-22: 40 meq via ORAL
  Filled 2015-09-22: qty 2

## 2015-09-22 NOTE — Progress Notes (Addendum)
Patient ID: [name] Roberts, male   DOB: December 06, 1940, 75 y.o.   MRN: [id number]    SUBJECTIVE: No complaints this morning, does not feel short of breath.  Not on any Lasix since yesterday morning.  I/Os even.    Scheduled Meds: . allopurinol  100 mg Oral Daily  . ALPRAZolam  0.5 mg Oral QHS  . amiodarone  100 mg Oral Daily  . aspirin EC  81 mg Oral QHS  . atorvastatin  20 mg Oral q1800  . fluticasone  2 spray Each Nare Daily  . furosemide  80 mg Intravenous BID  . heparin  5,000 Units Subcutaneous Q8H  . lactulose  20 g Oral TID  . levothyroxine  75 mcg Oral QAC breakfast  . Melatonin  3 mg Oral QHS  . niacin  500 mg Oral QHS  . polyvinyl alcohol  1 drop Both Eyes Daily  . potassium chloride  20 mEq Oral Once  . potassium chloride  40 mEq Oral Once  . sodium chloride flush  3 mL Intravenous Q12H  . spironolactone  25 mg Oral Daily   Continuous Infusions:  PRN Meds:.sodium chloride, acetaminophen, sodium chloride flush    Vitals:   09/21/15 1223 09/21/15 2050 09/22/15 0140 09/22/15 0450  BP: (!) 92/53 (!) 95/51 (!) 95/50 (!) 106/59  Pulse: (!) 58 62 64 66  Resp: 20 18 18 18   Temp: 97.7 F (36.5 C) 98.2 F (36.8 C) 98.1 F (36.7 C) 98.2 F (36.8 C)  TempSrc: Oral Oral Oral Oral  SpO2: 97% 100% 100% 100%  Weight:    153 lb (69.4 kg)  Height:        Intake/Output Summary (Last 24 hours) at 09/22/15 0755 Last data filed at 09/22/15 0220  Gross per 24 hour  Intake              340 ml  Output              781 ml  Net             -441 ml    LABS: Basic Metabolic Panel:  Recent Labs  40/98/1108/27/17 0819 09/22/15 0457  NA 136 135  K 4.3 3.1*  CL 104 99*  CO2 25 26  GLUCOSE 88 98  BUN 48* 45*  CREATININE 2.59* 2.43*  CALCIUM 8.6* 8.6*  MG 2.4 2.6*   Liver Function Tests:  Recent Labs  09/20/15 1748  AST 67*  ALT 54  ALKPHOS 158*  BILITOT 1.4*  PROT 6.6  ALBUMIN 3.1*    Recent Labs  09/20/15 1748  LIPASE 25   CBC:  Recent Labs  09/20/15 1745    WBC 8.0  HGB 12.7*  HCT 40.0  MCV 97.1  PLT 159   Cardiac Enzymes:  Recent Labs  09/20/15 2110 09/21/15 0247 09/21/15 0819  TROPONINI 0.03* <0.03 <0.03   BNP: Invalid input(s): POCBNP D-Dimer: No results for input(s): DDIMER in the last 72 hours. Hemoglobin A1C: No results for input(s): HGBA1C in the last 72 hours. Fasting Lipid Panel: No results for input(s): CHOL, HDL, LDLCALC, TRIG, CHOLHDL, LDLDIRECT in the last 72 hours. Thyroid Function Tests:  Recent Labs  09/21/15 0247  TSH 39.379*   Anemia Panel: No results for input(s): VITAMINB12, FOLATE, FERRITIN, TIBC, IRON, RETICCTPCT in the last 72 hours.  RADIOLOGY: Dg Chest 2 View  Result Date: 09/20/2015 CLINICAL DATA:  Shortness of Breath starting today. EXAM: CHEST  2 VIEW COMPARISON:  05/31/2015. FINDINGS: Lungs are hyperexpanded.  The lungs are clear wiithout focal pneumonia, edema, pneumothorax or pleural effusion. The cardio pericardial silhouette is enlarged. Status post CABG and cardiac valve replacement. Left pacer/AICD remains in place. The visualized bony structures of the thorax are intact. IMPRESSION: No acute cardiopulmonary findings. Electronically Signed   By: Kennith Center M.D.   On: 09/20/2015 17:38   US Paracentesis  Result Date: 09/18/2015 INDICATION: CHF, recurrent ascites.  Request made for therapeutic paracentesis. EXAM: ULTRASOUND GUIDED THERAPEUTIC PARACENTESIS MEDICATIONS: None. COMPLICATIONS: None immediate. PROCEDURE: Informed written consent was obtained from the patient after a discussion of the risks, benefits and alternatives to treatment. A timeout was performed prior to the initiation of the procedure. Initial ultrasound scanning demonstrates a moderate-to-large amount of ascites within the left lower abdominal quadrant. The left lower abdomen was prepped and draped in the usual sterile fashion. 1% lidocaine was used for local anesthesia. Following this, a Yueh catheter was introduced. An  ultrasound image was saved for documentation purposes. The paracentesis was performed. The catheter was removed and a dressing was applied. The patient tolerated the procedure well without immediate post procedural complication. FINDINGS: A total of approximately 4.6 liters of slightly hazy, amber fluid was removed. IMPRESSION: Successful ultrasound-guided therapeutic paracentesis yielding 4.6 liters of peritoneal fluid. Read by: Jeananne Rama, PA-C Electronically Signed   By: Jolaine Click M.D.   On: 09/18/2015 11:56   US Paracentesis  Result Date: 08/26/2015 INDICATION: CHF, recurrent ascites.  Request made for therapeutic paracentesis. EXAM: ULTRASOUND GUIDED THERAPEUTIC PARACENTESIS MEDICATIONS: None. COMPLICATIONS: None immediate. PROCEDURE: Informed written consent was obtained from the patient after a discussion of the risks, benefits and alternatives to treatment. A timeout was performed prior to the initiation of the procedure. Initial ultrasound scanning demonstrates a moderate-to-large amount of ascites within the left lower abdominal quadrant. The left lower abdomen was prepped and draped in the usual sterile fashion. 1% lidocaine was used for local anesthesia. Following this, a Yueh catheter was introduced. An ultrasound image was saved for documentation purposes. The paracentesis was performed. The catheter was removed and a dressing was applied. The patient tolerated the procedure well without immediate post procedural complication. FINDINGS: A total of approximately 4.6 liters of slightly hazy, amber fluid was removed. IMPRESSION: Successful ultrasound-guided therapeutic paracentesis yielding 4.6 liters of peritoneal fluid. Read by: Jeananne Rama, PA-C Electronically Signed   By: Richarda Overlie M.D.   On: 08/26/2015 11:33    PHYSICAL EXAM General: NAD Neck: JVP 14 cm, no thyromegaly or thyroid nodule.  Lungs: Mild dependent crackles. CV: Lateral PMI.  Heart regular S1/S2, no S3/S4, no murmur.  No  peripheral edema. Abdomen: Soft, nontender, no hepatosplenomegaly.  Mildly distended abdomen with fluid wave.  Neurologic: Alert and oriented x 3. No asterixis.  Psych: Normal affect. Extremities: No clubbing or cyanosis.   TELEMETRY: Reviewed telemetry pt in NSR with PVCs  ASSESSMENT AND PLAN: 75 yo with history of CAD s/p CABG, CKD stage III, chronic systolic CHF with prominent RV failure, cirrhosis, hypothyroidism presented with acute on chronic systolic CHF. 1. Acute on chronic systolic CHF: Echo in 5/17 with EF 20% and dilated RV. He has prominent RV failure. Has [employer] ICD.  He was admitted with severe orthopnea, has since felt better.  On exam, he is volume overloaded with JVD and suspected re-accumulated ascites.  He has not had Lasix since yesterday morning.  Creatinine is coming down.  - Lasix 80 mg IV bid start now, will add spironolactone 25 mg daily and titrate up  given ascites.  - KCl 40 bid => he has required fairly aggressive K replacement in past, adding spironolactone should help.  - Coreg was held at admission, he was on low dose given soft BP. - He is not on ACEI/ARB given elevated creatinine.  - Plan for RHC possible tomorrow to assess cardiac output and filling pressures.  - Difficult situation with heart, liver, and renal failure.  Not candidate for LVAD with significant renal and RV failure.  May need to think about palliative care in the near future. 2. AKI on CKD stage III: Baseline creatinine around 2.2.  Up to 2.7 on admission, now down to 2.4.  Possible that renal venous decongestion with diuresis/paracentesis will help.   3. CAD: Stable, no chest pain.  Continue ASA 81 and statin.  4. Hypothyroidism: In setting of amiodarone use.  TSH quite high this admission.  Increase levothyroxine to 75 mcg daily.  5. Cirrhosis: ?Etiology, may be 2/2 to RV failure.  Never a heavy drinker, viral hepatitis has not been identified.  He already had cirrhosis on imaging prior to  amiodarone initiation.    - Abdominal US to look for ascites today.  - NH3 high but no confusion.  He is on lactulose.  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  Decrease amiodarone to 100 mg daily with cirrhosis (though think cirrhosis pre-dated amiodarone).  7. S/p MV repair: Stable on last echo.   Marca Ancona 09/22/2015 8:05 AM

## 2015-09-22 NOTE — Progress Notes (Signed)
PROGRESS NOTE        PATIENT DETAILS Name: [name] Roberts Age: 75 y.o. Sex: male Date of Birth: 02-07-1940 Admit Date: 09/20/2015 Admitting Physician Lorretta Harp, MD ZOX:[id number] Opalski, DO  Brief Narrative: Patient is a 75 y.o. male with history of chronic systolic heart failure secondary to ischemic cardiomyopathy (EF 20% by echo on May 2017), history of CAD status post CABG, chronic kidney disease stage III admitted with acute on chronic systolic heart failure.  Subjective: Shortness of breath better  Assessment/Plan: Principal Problem: Acute on chronic systolic (congestive) heart failure(EF 20% by echo on May 2017): Feels better-Continue Lasix, Coreg, cardiology following, plans are for right heart catheterization tomorrow.   Active Problems: Acute on chronic kidney disease stage III: Likely secondary to above, creatinine has down trended and is almost close to his usual baseline-cautiously continue diuretics-blood pressure soft. Follow electrolytes.  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 Status post paracentesis today.  CAD status post CABG: No chest pain, continue aspirin, Coreg and statin.  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  MEDICATIONS: Anti-infectives    None      Scheduled Meds: .  allopurinol  100 mg Oral Daily  . ALPRAZolam  0.5 mg Oral QHS  . amiodarone  100 mg Oral Daily  . aspirin EC  81 mg Oral QHS  . atorvastatin  20 mg Oral q1800  . fluticasone  2 spray Each Nare Daily  . furosemide  80 mg Intravenous BID  . heparin  5,000 Units Subcutaneous Q8H  . lactulose  20 g Oral TID  . levothyroxine  75 mcg Oral QAC breakfast  . lidocaine      . Melatonin  3 mg Oral QHS  . niacin  500 mg Oral QHS  . polyvinyl alcohol  1 drop Both Eyes Daily  . potassium chloride  40 mEq Oral BID  . sodium chloride flush  3 mL Intravenous Q12H  . spironolactone  25 mg Oral Daily   Continuous Infusions:  PRN Meds:.sodium chloride, acetaminophen, sodium chloride flush   PHYSICAL EXAM: Vital signs: Vitals:   09/22/15 0945 09/22/15 0958 09/22/15 1002 09/22/15 1013  BP: 103/77 (!) 96/55 (!) 102/59 100/63  Pulse:      Resp:      Temp:      TempSrc:      SpO2:      Weight:      Height:       Filed Weights   09/20/15 2204 09/21/15 0534 09/22/15 0450  Weight: 70.4 kg (155 lb 3.3 oz) 68.5 kg (151 lb 1.6 oz) 69.4 kg (153 lb)   Body mass index is 24.69 kg/m.   Gen Exam: Awake and alert with clear speech. Not in any distress  Neck: Supple, No JVD.   Chest: Few bibasilar rales CVS: S1 S2 Regular,Plus 3/6 systolic  murmur Abdomen: soft, BS +, non tender, distended with ascites Extremities: no edema, lower extremities warm to touch Neurologic: Non Focal.   Skin: No Rash or lesions   Wounds: N/A.    I have personally reviewed following labs and imaging studies  LABORATORY DATA: CBC:  Recent Labs Lab 09/20/15 1745  WBC 8.0  HGB 12.7*  HCT 40.0  MCV 97.1  PLT 159    Basic Metabolic Panel:  Recent Labs Lab 09/20/15 1745 09/21/15 0819 09/22/15 0457  NA 135 136 135  K 5.6* 4.3 3.1*  CL 101 104 99*  CO2 26 25 26   GLUCOSE 103* 88 98  BUN 51* 48* 45*  CREATININE 2.70* 2.59* 2.43*  CALCIUM 8.9 8.6* 8.6*  MG  --  2.4 2.6*    GFR: Estimated Creatinine  Clearance: 24.1 mL/min (by C-G formula based on SCr of 2.43 mg/dL).  Liver Function Tests:  Recent Labs Lab 09/20/15 1748  AST 67*  ALT 54  ALKPHOS 158*  BILITOT 1.4*  PROT 6.6  ALBUMIN 3.1*    Recent Labs Lab 09/20/15 1748  LIPASE 25    Recent Labs Lab 09/20/15 1850  AMMONIA 88*    Coagulation Profile:  Recent Labs Lab 09/20/15 1748  INR 1.29    Cardiac Enzymes:  Recent Labs Lab 09/20/15 2110 09/21/15 0247 09/21/15 0819  TROPONINI 0.03* <0.03 <0.03    BNP (last 3 results)  Recent Labs  06/20/15 1234  PROBNP 2,636.0*    HbA1C: No results for input(s): HGBA1C in the last 72 hours.  CBG: No results for input(s): GLUCAP in the last 168 hours.  Lipid Profile: No results for input(s): CHOL, HDL, LDLCALC, TRIG, CHOLHDL, LDLDIRECT in the last 72 hours.  Thyroid Function Tests:  Recent Labs  09/21/15 0247  TSH 39.379*    Anemia Panel: No results for input(s): VITAMINB12, FOLATE, FERRITIN, TIBC, IRON, RETICCTPCT in the last 72 hours.  Urine analysis:    Component Value Date/Time   COLORURINE YELLOW 09/20/2015 1901   APPEARANCEUR CLEAR 09/20/2015 1901   LABSPEC 1.008 09/20/2015 1901   PHURINE 7.5 09/20/2015 1901   GLUCOSEU NEGATIVE 09/20/2015 1901   HGBUR NEGATIVE 09/20/2015 1901   BILIRUBINUR NEGATIVE 09/20/2015 1901   KETONESUR NEGATIVE 09/20/2015 1901   PROTEINUR NEGATIVE 09/20/2015 1901   NITRITE NEGATIVE 09/20/2015 1901   LEUKOCYTESUR NEGATIVE 09/20/2015 1901    Sepsis Labs: Lactic Acid, Venous    Component Value Date/Time   LATICACIDVEN 1.38 09/20/2015 1857    MICROBIOLOGY: Recent Results (from the past 240 hour(s))  Culture, blood (routine x 2)     Status: None (Preliminary result)   Collection Time: 09/20/15  6:45 PM  Result Value Ref Range Status   Specimen Description BLOOD RIGHT ARM  Final   Special Requests BOTTLES DRAWN AEROBIC AND ANAEROBIC 10CC  Final   Culture NO GROWTH < 24 HOURS  Final   Report Status  PENDING  Incomplete  Culture, blood (routine x 2)     Status: None (Preliminary result)   Collection Time: 09/20/15  6:50 PM  Result Value Ref Range Status   Specimen Description BLOOD RIGHT HAND  Final   Special Requests BOTTLES DRAWN AEROBIC AND ANAEROBIC 5CC  Final   Culture NO GROWTH < 24 HOURS  Final   Report Status PENDING  Incomplete    RADIOLOGY STUDIES/RESULTS: Dg Chest 2 View  Result Date: 09/20/2015 CLINICAL DATA:  Shortness of Breath starting today. EXAM: CHEST  2 VIEW COMPARISON:  05/31/2015. FINDINGS: Lungs are hyperexpanded.  The lungs are clear wiithout focal pneumonia, edema, pneumothorax or pleural effusion. The cardio pericardial silhouette is enlarged. Status post CABG and cardiac valve replacement. Left pacer/AICD remains in place. The visualized bony structures of the thorax are intact. IMPRESSION: No acute cardiopulmonary findings. Electronically Signed   By: Kennith Center M.D.   On: 09/20/2015 17:38   US Paracentesis  Result Date: 09/22/2015 INDICATION: Congestive heart failure. Recurrent ascites. Request therapeutic paracentesis. EXAM: ULTRASOUND GUIDED LEFT LOWER QUADRANT PARACENTESIS MEDICATIONS: None. COMPLICATIONS: None immediate. PROCEDURE: Informed written consent was obtained from the patient after a discussion of the risks, benefits and alternatives to treatment. A timeout was performed prior to the initiation of the procedure. Initial ultrasound scanning demonstrates a large amount of ascites within the left lower abdominal quadrant. The right lower abdomen was prepped and draped in the usual sterile fashion. 1% lidocaine with epinephrine was used for local anesthesia. Following this, a Safe-T-Centesis catheter was introduced. An ultrasound image was saved for documentation purposes. The paracentesis was performed. The catheter was removed and a dressing was applied. The patient tolerated the procedure well without immediate post procedural complication. FINDINGS: A  total of approximately 3.2 L of clear yellow fluid was removed. IMPRESSION: Successful ultrasound-guided paracentesis yielding 3.2 liters of peritoneal fluid. Read by: Brayton El PA-C Electronically Signed   By: Irish Lack M.D.   On: 09/22/2015 10:39   US Paracentesis  Result Date: 09/18/2015 INDICATION: CHF, recurrent ascites.  Request made for therapeutic paracentesis. EXAM: ULTRASOUND GUIDED THERAPEUTIC PARACENTESIS MEDICATIONS: None. COMPLICATIONS: None immediate. PROCEDURE: Informed written consent was obtained from the patient after a discussion of the risks, benefits and alternatives to treatment. A timeout was performed prior to the initiation of the procedure. Initial ultrasound scanning demonstrates a moderate-to-large amount of ascites within the left lower abdominal quadrant. The left lower abdomen was prepped and draped in the usual sterile fashion. 1% lidocaine was used for local anesthesia. Following this, a Yueh catheter was introduced. An ultrasound image was saved for documentation purposes. The paracentesis was performed. The catheter was removed and a dressing was applied. The patient tolerated the procedure well without immediate post procedural complication. FINDINGS: A total of approximately 4.6 liters of slightly hazy, amber fluid was removed. IMPRESSION: Successful ultrasound-guided therapeutic paracentesis yielding 4.6 liters of peritoneal fluid. Read by: Jeananne Rama, PA-C Electronically Signed   By: Jolaine Click M.D.   On: 09/18/2015 11:56   US Paracentesis  Result Date: 08/26/2015 INDICATION: CHF, recurrent ascites.  Request made for therapeutic paracentesis. EXAM: ULTRASOUND GUIDED THERAPEUTIC PARACENTESIS MEDICATIONS: None. COMPLICATIONS: None immediate. PROCEDURE: Informed written consent was obtained from the patient after a discussion of the risks, benefits and alternatives to treatment. A timeout was performed prior to the initiation of the procedure. Initial  ultrasound scanning demonstrates a moderate-to-large amount of ascites within the left lower abdominal quadrant. The left lower abdomen was prepped and draped in the usual sterile fashion. 1% lidocaine was used for local anesthesia. Following this, a Yueh catheter was introduced. An ultrasound image was saved for documentation purposes. The paracentesis was performed. The catheter was removed and a dressing was applied. The patient tolerated the procedure well without immediate post procedural complication. FINDINGS: A total of approximately 4.6 liters of slightly hazy, amber fluid was removed. IMPRESSION: Successful ultrasound-guided therapeutic paracentesis yielding 4.6 liters of peritoneal fluid. Read by: Jeananne Rama, PA-C Electronically Signed   By: Richarda Overlie M.D.   On: 08/26/2015 11:33     LOS: 2 days  Jeoffrey MassedGHIMIRE,SHANKER, MD  [facility] Pager:[phone]  If 7PM-7AM, please contact night-coverage [email or web address] Password [email or web address] 09/22/2015, 12:20 PM

## 2015-09-22 NOTE — Progress Notes (Signed)
Echocardiogram 2D Echocardiogram has been performed.  [name] Roberts 09/22/2015, 3:11 PM

## 2015-09-22 NOTE — Progress Notes (Signed)
OT Cancellation Note  Patient Details Name: [name] Roberts MRN: [id number] DOB: [birth year]   Cancelled Treatment:     Pt. Does not have any skilled OT needs at this time.   Erleen Egner 09/22/2015, 12:53 PM

## 2015-09-22 NOTE — Progress Notes (Signed)
Patient returned to unit from paracentesis. Patient is resting. Site is clean, dry and intact Industrial/product designer, RN

## 2015-09-22 NOTE — Procedures (Signed)
Successful US guided paracentesis from LLQ.  Yielded 3.2L of clear yellow fluid.  No immediate complications.  Pt tolerated well.   Specimen was not sent for labs.  Brayton El PA-C 09/22/2015 10:38 AM

## 2015-09-23 ENCOUNTER — Encounter (HOSPITAL_COMMUNITY): Payer: Self-pay | Admitting: Cardiology

## 2015-09-23 ENCOUNTER — Inpatient Hospital Stay (HOSPITAL_COMMUNITY)
Admission: EM | Disposition: A | Discharge status: Home / Self Care | Payer: Self-pay | Source: Home / Self Care | Attending: Internal Medicine

## 2015-09-23 DIAGNOSIS — I509 Heart failure, unspecified: Secondary | ICD-10-CM

## 2015-09-23 HISTORY — PX: CARDIAC CATHETERIZATION: SHX172

## 2015-09-23 LAB — POCT I-STAT 3, VENOUS BLOOD GAS (G3P V)
ACID-BASE EXCESS: 3 mmol/L — AB (ref 0.0–2.0)
Acid-Base Excess: 3 mmol/L — ABNORMAL HIGH (ref 0.0–2.0)
BICARBONATE: 27.6 mmol/L (ref 20.0–28.0)
Bicarbonate: 27.6 mmol/L (ref 20.0–28.0)
O2 SAT: 55 %
O2 Saturation: 55 %
PO2 VEN: 28 mmHg — AB (ref 32.0–45.0)
TCO2: 29 mmol/L (ref 0–100)
TCO2: 29 mmol/L (ref 0–100)
pCO2, Ven: 39.9 mmHg — ABNORMAL LOW (ref 44.0–60.0)
pCO2, Ven: 40 mmHg — ABNORMAL LOW (ref 44.0–60.0)
pH, Ven: 7.447 — ABNORMAL HIGH (ref 7.250–7.430)
pH, Ven: 7.448 — ABNORMAL HIGH (ref 7.250–7.430)
pO2, Ven: 28 mmHg — CL (ref 32.0–45.0)

## 2015-09-23 LAB — BASIC METABOLIC PANEL
Anion gap: 10 (ref 5–15)
BUN: 38 mg/dL — AB (ref 6–20)
CHLORIDE: 102 mmol/L (ref 101–111)
CO2: 26 mmol/L (ref 22–32)
CREATININE: 2.01 mg/dL — AB (ref 0.61–1.24)
Calcium: 8.6 mg/dL — ABNORMAL LOW (ref 8.9–10.3)
GFR calc Af Amer: 36 mL/min — ABNORMAL LOW (ref >60)
GFR calc non Af Amer: 31 mL/min — ABNORMAL LOW (ref >60)
Glucose, Bld: 96 mg/dL (ref 65–99)
Potassium: 3.8 mmol/L (ref 3.5–5.1)
SODIUM: 138 mmol/L (ref 135–145)

## 2015-09-23 LAB — HEPATITIS PANEL, ACUTE
HCV Ab: <0.1 s/co ratio (ref 0.0–0.9)
HEP B C IGM: NEGATIVE
HEP B S AG: NEGATIVE
Hep A IgM: NEGATIVE

## 2015-09-23 LAB — CBC
HEMATOCRIT: 35.8 % — AB (ref 39.0–52.0)
Hemoglobin: 11.5 g/dL — ABNORMAL LOW (ref 13.0–17.0)
MCH: 30.5 pg (ref 26.0–34.0)
MCHC: 32.1 g/dL (ref 30.0–36.0)
MCV: 95 fL (ref 78.0–100.0)
PLATELETS: 146 10*3/uL — AB (ref 150–400)
RBC: 3.77 MIL/uL — ABNORMAL LOW (ref 4.22–5.81)
RDW: 17.1 % — AB (ref 11.5–15.5)
WBC: 7 10*3/uL (ref 4.0–10.5)

## 2015-09-23 LAB — MAGNESIUM: MAGNESIUM: 2.5 mg/dL — AB (ref 1.7–2.4)

## 2015-09-23 SURGERY — RIGHT HEART CATH
Anesthesia: LOCAL

## 2015-09-23 MED ORDER — HEPARIN (PORCINE) IN NACL 2-0.9 UNIT/ML-% IJ SOLN
INTRAMUSCULAR | Status: DC | PRN
  Administered 2015-09-23: 500 mL

## 2015-09-23 MED ORDER — SPIRONOLACTONE 25 MG PO TABS
25.0000 mg | ORAL_TABLET | Freq: Two times a day (BID) | ORAL | Status: DC
  Administered 2015-09-23 – 2015-09-24 (×3): 25 mg via ORAL
  Filled 2015-09-23 (×3): qty 1

## 2015-09-23 MED ORDER — SODIUM CHLORIDE 0.9 % IV SOLN: 250.0000 mL | INTRAVENOUS | Status: DC | PRN

## 2015-09-23 MED ORDER — ONDANSETRON HCL 4 MG/2ML IJ SOLN: 4.0000 mg | Freq: Four times a day (QID) | INTRAMUSCULAR | Status: DC | PRN

## 2015-09-23 MED ORDER — FENTANYL CITRATE (PF) 100 MCG/2ML IJ SOLN
INTRAMUSCULAR | Status: DC | PRN
  Administered 2015-09-23: 25 ug via INTRAVENOUS

## 2015-09-23 MED ORDER — CARVEDILOL 3.125 MG PO TABS
1.5625 mg | ORAL_TABLET | Freq: Two times a day (BID) | ORAL | Status: DC
  Administered 2015-09-23 – 2015-09-24 (×3): 1.5625 mg via ORAL
  Filled 2015-09-23 (×3): qty 1

## 2015-09-23 MED ORDER — LIDOCAINE HCL (PF) 1 % IJ SOLN
INTRAMUSCULAR | Status: AC
  Filled 2015-09-23: qty 30

## 2015-09-23 MED ORDER — HEPARIN SODIUM (PORCINE) 5000 UNIT/ML IJ SOLN
5000.0000 [IU] | Freq: Three times a day (TID) | INTRAMUSCULAR | Status: DC
  Administered 2015-09-23 – 2015-09-24 (×2): 5000 [IU] via SUBCUTANEOUS
  Filled 2015-09-23 (×2): qty 1

## 2015-09-23 MED ORDER — SODIUM CHLORIDE 0.9% FLUSH: 3.0000 mL | INTRAVENOUS | Status: DC | PRN

## 2015-09-23 MED ORDER — FENTANYL CITRATE (PF) 100 MCG/2ML IJ SOLN
INTRAMUSCULAR | Status: AC
Start: 2015-09-23 — End: 2015-09-23
  Filled 2015-09-23: qty 2

## 2015-09-23 MED ORDER — LIDOCAINE HCL (PF) 1 % IJ SOLN
INTRAMUSCULAR | Status: DC | PRN
  Administered 2015-09-23: 2 mL via SUBCUTANEOUS

## 2015-09-23 MED ORDER — SODIUM CHLORIDE 0.9% FLUSH: 3.0000 mL | Freq: Two times a day (BID) | INTRAVENOUS | Status: DC

## 2015-09-23 MED ORDER — METOLAZONE 2.5 MG PO TABS
2.5000 mg | ORAL_TABLET | Freq: Once | ORAL | Status: AC
  Administered 2015-09-23: 2.5 mg via ORAL
  Filled 2015-09-23: qty 1

## 2015-09-23 MED ORDER — SODIUM CHLORIDE 0.9% FLUSH
3.0000 mL | INTRAVENOUS | Status: DC | PRN
  Administered 2015-09-23: 3 mL via INTRAVENOUS
  Filled 2015-09-23: qty 3

## 2015-09-23 MED ORDER — HEPARIN (PORCINE) IN NACL 2-0.9 UNIT/ML-% IJ SOLN
INTRAMUSCULAR | Status: AC
  Filled 2015-09-23: qty 1000

## 2015-09-23 MED ORDER — ACETAMINOPHEN 325 MG PO TABS: 650.0000 mg | ORAL_TABLET | ORAL | Status: DC | PRN

## 2015-09-23 SURGICAL SUPPLY — 11 items
CATH BALLN WEDGE 5F 110CM (CATHETERS) ×1 IMPLANT
CATH SWAN GANZ 7F STRAIGHT (CATHETERS) ×1 IMPLANT
GUIDEWIRE .025 260CM (WIRE) ×1 IMPLANT
KIT HEART RIGHT NAMIC (KITS) ×2 IMPLANT
PACK CARDIAC CATHETERIZATION (CUSTOM PROCEDURE TRAY) ×2 IMPLANT
SHEATH FAST CATH BRACH 5F 5CM (SHEATH) ×1 IMPLANT
SHEATH PINNACLE 7F 10CM (SHEATH) ×1 IMPLANT
TRANSDUCER W/STOPCOCK (MISCELLANEOUS) ×3 IMPLANT
TUBING ART PRESS 72  MALE/FEM (TUBING) ×1
TUBING ART PRESS 72 MALE/FEM (TUBING) IMPLANT
TUBING CIL FLEX 10 FLL-RA (TUBING) ×2 IMPLANT

## 2015-09-23 NOTE — Interval H&P Note (Signed)
History and Physical Interval Note:  09/23/2015 12:52 PM  [name] Roberts  has presented today for surgery, with the diagnosis of chf  The various methods of treatment have been discussed with the patient and family. After consideration of risks, benefits and other options for treatment, the patient has consented to  Procedure(s): Right Heart Cath (N/A) as a surgical intervention .  The patient's history has been reviewed, patient examined, no change in status, stable for surgery.  I have reviewed the patient's chart and labs.  Questions were answered to the patient's satisfaction.     Gearline Spilman [employer]

## 2015-09-23 NOTE — Progress Notes (Signed)
At 1845 notified by central monitor pt had 7 beats of   VT and asymptomatic.  Pt resting in bed quietly and denies any pain.  Notified [place], [place] on call.  No new orders given.  Will continue to monitor.  Kynslei Art, Charity fundraiser.

## 2015-09-23 NOTE — Progress Notes (Signed)
PROGRESS NOTE        PATIENT DETAILS Name: [name] Roberts Age: 75 y.o. Sex: male Date of Birth: [birth year] Admit Date: 09/20/2015 Admitting Physician Lorretta HarpXilin Niu, MD VHQ:[id number]:Deborah Opalski, DO  Brief Narrative: Patient is a 75 y.o. male with history of chronic systolic heart failure secondary to ischemic cardiomyopathy (EF 20% by echo on May 2017), history of CAD status post CABG, chronic kidney disease stage III admitted with acute on chronic systolic heart failure.  Subjective: Slowly improving-ambulating in the room-less shortness of breath than on admission.  Assessment/Plan: Principal Problem: Acute on chronic systolic (congestive) heart failure(EF 20% by echo on May 2017): Feels better-Continue Lasix, Coreg, cardiology following, plans are for right heart catheterization today. Await further recommendations from CHF team.  Active Problems: Acute on chronic kidney disease stage III: Likely secondary to above, creatinine has down trended and is almost close to his usual baseline-cautiously continue diuretics-blood pressure soft. Follow electrolytes.  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 Status post paracentesis on 8/28 with removal of 3.2 L of fluid.  CAD status post CABG: No chest pain, continue aspirin, Coreg and statin.  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  Mitral Valve Disease: S/P Mitral Valve Ring 2007  Automatic implantable cardioverter-defibrillator in situ (Dr Ladona Ridgelaylor- EP-Cards)  Hypothyroidism: Continue levothyroxine  Anxiety: Continue Xanax  DVT Prophylaxis: Prophylactic Heparin   Code Status:  DNI  Family Communication: None at bedside  Disposition Plan: Remain inpatient-home when cleared by cardiology  Antimicrobial agents: None  Procedures: None  CONSULTS:  cardiology  Time spent: 25- minutes-Greater than 50% of this time was spent in counseling, explanation of  diagnosis, planning of further management, and coordination of care.  MEDICATIONS: Anti-infectives    None      Scheduled Meds: . allopurinol  100 mg Oral Daily  . ALPRAZolam  0.5 mg Oral QHS  . amiodarone  100 mg Oral Daily  . aspirin EC  81 mg Oral QHS  . atorvastatin  20 mg Oral q1800  . carvedilol  1.5625 mg Oral BID WC  . fluticasone  2 spray Each Nare Daily  . furosemide  80 mg Intravenous BID  . heparin  5,000 Units Subcutaneous Q8H  . lactulose  20 g Oral TID  . levothyroxine  75 mcg Oral QAC breakfast  . Melatonin  3 mg Oral QHS  . niacin  500 mg Oral QHS  . polyvinyl alcohol  1 drop Both Eyes Daily  . potassium chloride  40 mEq Oral BID  . sodium chloride flush  3 mL Intravenous Q12H  . sodium chloride flush  3 mL Intravenous Q12H  . spironolactone  25 mg Oral BID   Continuous Infusions: . sodium chloride     PRN Meds:.sodium chloride, sodium chloride, acetaminophen, sodium chloride flush, sodium chloride flush   PHYSICAL EXAM: Vital signs: Vitals:   09/22/15 2341 09/23/15 0432 09/23/15 0808 09/23/15 0823  BP: (!) 101/59 (!) 98/57 (!) 92/50   Pulse: 71 68 68 62  Resp: 16 16 16    Temp: 97.7 F (36.5 C) 97.9 F (36.6 C) 98.1 F (36.7 C)   TempSrc: Oral Oral Oral   SpO2: 97% 96% 95%   Weight:  67.2 kg (148 lb 1.6 oz)    Height:       Filed Weights   09/21/15 0534 09/22/15 0450 09/23/15 [id number]  Weight: 68.5 kg (151 lb 1.6 oz) 69.4 kg (153 lb) 67.2 kg (148 lb 1.6 oz)   Body mass index is 23.9 kg/m.   Gen Exam: Awake and alert with clear speech. Not in any distress  Neck: Supple, No JVD.   Chest: Few bibasilar rales CVS: S1 S2 Regular,Plus 3/6 systolic murmur Abdomen: soft, BS +, non tender,Mildly distended this morning compared to yesterday.  Extremities: no edema, lower extremities warm to touch Neurologic: Non Focal.   Skin: No Rash or lesions   Wounds: N/A.    I have personally reviewed following labs and imaging studies  LABORATORY  DATA: CBC:  Recent Labs Lab 09/20/15 1745 09/23/15 0607  WBC 8.0 7.0  HGB 12.7* 11.5*  HCT 40.0 35.8*  MCV 97.1 95.0  PLT 159 146*    Basic Metabolic Panel:  Recent Labs Lab 09/20/15 1745 09/21/15 0819 09/22/15 0457 09/23/15 0607  NA 135 136 135 138  K 5.6* 4.3 3.1* 3.8  CL 101 104 99* 102  CO2 26 25 26 26   GLUCOSE 103* 88 98 96  BUN 51* 48* 45* 38*  CREATININE 2.70* 2.59* 2.43* 2.01*  CALCIUM 8.9 8.6* 8.6* 8.6*  MG  --  2.4 2.6* 2.5*    GFR: Estimated Creatinine Clearance: 29.1 mL/min (by C-G formula based on SCr of 2.01 mg/dL).  Liver Function Tests:  Recent Labs Lab 09/20/15 1748  AST 67*  ALT 54  ALKPHOS 158*  BILITOT 1.4*  PROT 6.6  ALBUMIN 3.1*    Recent Labs Lab 09/20/15 1748  LIPASE 25    Recent Labs Lab 09/20/15 1850  AMMONIA 88*    Coagulation Profile:  Recent Labs Lab 09/20/15 1748  INR 1.29    Cardiac Enzymes:  Recent Labs Lab 09/20/15 2110 09/21/15 0247 09/21/15 0819  TROPONINI 0.03* <0.03 <0.03    BNP (last 3 results)  Recent Labs  06/20/15 1234  PROBNP 2,636.0*    HbA1C: No results for input(s): HGBA1C in the last 72 hours.  CBG: No results for input(s): GLUCAP in the last 168 hours.  Lipid Profile: No results for input(s): CHOL, HDL, LDLCALC, TRIG, CHOLHDL, LDLDIRECT in the last 72 hours.  Thyroid Function Tests:  Recent Labs  09/21/15 0247  TSH 39.379*    Anemia Panel: No results for input(s): VITAMINB12, FOLATE, FERRITIN, TIBC, IRON, RETICCTPCT in the last 72 hours.  Urine analysis:    Component Value Date/Time   COLORURINE YELLOW 09/20/2015 1901   APPEARANCEUR CLEAR 09/20/2015 1901   LABSPEC 1.008 09/20/2015 1901   PHURINE 7.5 09/20/2015 1901   GLUCOSEU NEGATIVE 09/20/2015 1901   HGBUR NEGATIVE 09/20/2015 1901   BILIRUBINUR NEGATIVE 09/20/2015 1901   KETONESUR NEGATIVE 09/20/2015 1901   PROTEINUR NEGATIVE 09/20/2015 1901   NITRITE NEGATIVE 09/20/2015 1901   LEUKOCYTESUR NEGATIVE  09/20/2015 1901    Sepsis Labs: Lactic Acid, Venous    Component Value Date/Time   LATICACIDVEN 1.38 09/20/2015 1857    MICROBIOLOGY: Recent Results (from the past 240 hour(s))  Culture, blood (routine x 2)     Status: None (Preliminary result)   Collection Time: 09/20/15  6:45 PM  Result Value Ref Range Status   Specimen Description BLOOD RIGHT ARM  Final   Special Requests BOTTLES DRAWN AEROBIC AND ANAEROBIC 10CC  Final   Culture NO GROWTH 3 DAYS  Final   Report Status PENDING  Incomplete  Culture, blood (routine x 2)     Status: None (Preliminary result)   Collection Time: 09/20/15  6:50 PM  Result Value Ref Range Status   Specimen Description BLOOD RIGHT HAND  Final   Special Requests BOTTLES DRAWN AEROBIC AND ANAEROBIC 5CC  Final   Culture NO GROWTH 3 DAYS  Final   Report Status PENDING  Incomplete    RADIOLOGY STUDIES/RESULTS: Dg Chest 2 View  Result Date: 09/20/2015 CLINICAL DATA:  Shortness of Breath starting today. EXAM: CHEST  2 VIEW COMPARISON:  05/31/2015. FINDINGS: Lungs are hyperexpanded. The lungs are clear wiithout focal pneumonia, edema, pneumothorax or pleural effusion. The cardio pericardial silhouette is enlarged. Status post CABG and cardiac valve replacement. Left pacer/AICD remains in place. The visualized bony structures of the thorax are intact. IMPRESSION: No acute cardiopulmonary findings. Electronically Signed   By: Kennith Center M.D.   On: 09/20/2015 17:38   US Paracentesis  Result Date: 09/22/2015 INDICATION: Congestive heart failure. Recurrent ascites. Request therapeutic paracentesis. EXAM: ULTRASOUND GUIDED LEFT LOWER QUADRANT PARACENTESIS MEDICATIONS: None. COMPLICATIONS: None immediate. PROCEDURE: Informed written consent was obtained from the patient after a discussion of the risks, benefits and alternatives to treatment. A timeout was performed prior to the initiation of the procedure. Initial ultrasound scanning demonstrates a large amount of  ascites within the left lower abdominal quadrant. The right lower abdomen was prepped and draped in the usual sterile fashion. 1% lidocaine with epinephrine was used for local anesthesia. Following this, a Safe-T-Centesis catheter was introduced. An ultrasound image was saved for documentation purposes. The paracentesis was performed. The catheter was removed and a dressing was applied. The patient tolerated the procedure well without immediate post procedural complication. FINDINGS: A total of approximately 3.2 L of clear yellow fluid was removed. IMPRESSION: Successful ultrasound-guided paracentesis yielding 3.2 liters of peritoneal fluid. Read by: Brayton El PA-C Electronically Signed   By: Irish Lack M.D.   On: 09/22/2015 10:39   US Paracentesis  Result Date: 09/18/2015 INDICATION: CHF, recurrent ascites.  Request made for therapeutic paracentesis. EXAM: ULTRASOUND GUIDED THERAPEUTIC PARACENTESIS MEDICATIONS: None. COMPLICATIONS: None immediate. PROCEDURE: Informed written consent was obtained from the patient after a discussion of the risks, benefits and alternatives to treatment. A timeout was performed prior to the initiation of the procedure. Initial ultrasound scanning demonstrates a moderate-to-large amount of ascites within the left lower abdominal quadrant. The left lower abdomen was prepped and draped in the usual sterile fashion. 1% lidocaine was used for local anesthesia. Following this, a Yueh catheter was introduced. An ultrasound image was saved for documentation purposes. The paracentesis was performed. The catheter was removed and a dressing was applied. The patient tolerated the procedure well without immediate post procedural complication. FINDINGS: A total of approximately 4.6 liters of slightly hazy, amber fluid was removed. IMPRESSION: Successful ultrasound-guided therapeutic paracentesis yielding 4.6 liters of peritoneal fluid. Read by: Jeananne Rama, PA-C Electronically Signed    By: Jolaine Click M.D.   On: 09/18/2015 11:56   US Paracentesis  Result Date: 08/26/2015 INDICATION: CHF, recurrent ascites.  Request made for therapeutic paracentesis. EXAM: ULTRASOUND GUIDED THERAPEUTIC PARACENTESIS MEDICATIONS: None. COMPLICATIONS: None immediate. PROCEDURE: Informed written consent was obtained from the patient after a discussion of the risks, benefits and alternatives to treatment. A timeout was performed prior to the initiation of the procedure. Initial ultrasound scanning demonstrates a moderate-to-large amount of ascites within the left lower abdominal quadrant. The left lower abdomen was prepped and draped in the usual sterile fashion. 1% lidocaine was used for local anesthesia. Following this, a Yueh catheter was introduced. An ultrasound image was saved for documentation purposes. The  paracentesis was performed. The catheter was removed and a dressing was applied. The patient tolerated the procedure well without immediate post procedural complication. FINDINGS: A total of approximately 4.6 liters of slightly hazy, amber fluid was removed. IMPRESSION: Successful ultrasound-guided therapeutic paracentesis yielding 4.6 liters of peritoneal fluid. Read by: Jeananne Rama, PA-C Electronically Signed   By: Richarda Overlie M.D.   On: 08/26/2015 11:33     LOS: 3 days   Jeoffrey Massed, MD  [facility] Pager:[phone]  If 7PM-7AM, please contact night-coverage [email or web address] Password [email or web address] 09/23/2015, 11:11 AM

## 2015-09-23 NOTE — Progress Notes (Signed)
Patient ID: [name] Roberts, male   DOB: November 30, 1940, 75 y.o.   MRN: [id number]    SUBJECTIVE: Paracentesis 8/28 with 3.2 L removed.  I/Os negative but not markedly so with Lasix 80 mg IV bid.  Creatinine down to 2.01.    He is feeling fine this morning, denies dyspnea at rest or walking around his room.   Echo (09/22/15): EF 20% with moderate LV dilation, moderately dilated RV with severely decreased systolic function, IVC dilated, PASP 56 mmHg.   Scheduled Meds: . allopurinol  100 mg Oral Daily  . ALPRAZolam  0.5 mg Oral QHS  . amiodarone  100 mg Oral Daily  . aspirin EC  81 mg Oral QHS  . atorvastatin  20 mg Oral q1800  . carvedilol  1.5625 mg Oral BID WC  . fluticasone  2 spray Each Nare Daily  . furosemide  80 mg Intravenous BID  . heparin  5,000 Units Subcutaneous Q8H  . lactulose  20 g Oral TID  . levothyroxine  75 mcg Oral QAC breakfast  . Melatonin  3 mg Oral QHS  . metolazone  2.5 mg Oral Once  . niacin  500 mg Oral QHS  . polyvinyl alcohol  1 drop Both Eyes Daily  . potassium chloride  40 mEq Oral BID  . sodium chloride flush  3 mL Intravenous Q12H  . sodium chloride flush  3 mL Intravenous Q12H  . spironolactone  25 mg Oral BID   Continuous Infusions: . sodium chloride     PRN Meds:.sodium chloride, sodium chloride, acetaminophen, sodium chloride flush, sodium chloride flush    Vitals:   09/22/15 1504 09/22/15 2038 09/22/15 2341 09/23/15 0432  BP: 101/76 (!) 95/53 (!) 101/59 (!) 98/57  Pulse: 68 69 71 68  Resp: 18 16 16 16   Temp: 97.8 F (36.6 C) 98.6 F (37 C) 97.7 F (36.5 C) 97.9 F (36.6 C)  TempSrc: Oral Oral Oral Oral  SpO2: 100% 99% 97% 96%  Weight:    148 lb 1.6 oz (67.2 kg)  Height:        Intake/Output Summary (Last 24 hours) at 09/23/15 0734 Last data filed at 09/23/15 0600  Gross per 24 hour  Intake              720 ml  Output             1450 ml  Net             -730 ml    LABS: Basic Metabolic Panel:  Recent Labs   09/22/15 0457 09/23/15 0607  NA 135 138  K 3.1* 3.8  CL 99* 102  CO2 26 26  GLUCOSE 98 96  BUN 45* 38*  CREATININE 2.43* 2.01*  CALCIUM 8.6* 8.6*  MG 2.6* 2.5*   Liver Function Tests:  Recent Labs  09/20/15 1748  AST 67*  ALT 54  ALKPHOS 158*  BILITOT 1.4*  PROT 6.6  ALBUMIN 3.1*    Recent Labs  09/20/15 1748  LIPASE 25   CBC:  Recent Labs  09/20/15 1745 09/23/15 0607  WBC 8.0 7.0  HGB 12.7* 11.5*  HCT 40.0 35.8*  MCV 97.1 95.0  PLT 159 146*   Cardiac Enzymes:  Recent Labs  09/20/15 2110 09/21/15 0247 09/21/15 0819  TROPONINI 0.03* <0.03 <0.03   BNP: Invalid input(s): POCBNP D-Dimer: No results for input(s): DDIMER in the last 72 hours. Hemoglobin A1C: No results for input(s): HGBA1C in the last 72 hours. Fasting Lipid  Panel: No results for input(s): CHOL, HDL, LDLCALC, TRIG, CHOLHDL, LDLDIRECT in the last 72 hours. Thyroid Function Tests:  Recent Labs  09/21/15 0247  TSH 39.379*   Anemia Panel: No results for input(s): VITAMINB12, FOLATE, FERRITIN, TIBC, IRON, RETICCTPCT in the last 72 hours.  RADIOLOGY: Dg Chest 2 View  Result Date: 09/20/2015 CLINICAL DATA:  Shortness of Breath starting today. EXAM: CHEST  2 VIEW COMPARISON:  05/31/2015. FINDINGS: Lungs are hyperexpanded. The lungs are clear wiithout focal pneumonia, edema, pneumothorax or pleural effusion. The cardio pericardial silhouette is enlarged. Status post CABG and cardiac valve replacement. Left pacer/AICD remains in place. The visualized bony structures of the thorax are intact. IMPRESSION: No acute cardiopulmonary findings. Electronically Signed   By: Kennith Center M.D.   On: 09/20/2015 17:38   US Paracentesis  Result Date: 09/22/2015 INDICATION: Congestive heart failure. Recurrent ascites. Request therapeutic paracentesis. EXAM: ULTRASOUND GUIDED LEFT LOWER QUADRANT PARACENTESIS MEDICATIONS: None. COMPLICATIONS: None immediate. PROCEDURE: Informed written consent was  obtained from the patient after a discussion of the risks, benefits and alternatives to treatment. A timeout was performed prior to the initiation of the procedure. Initial ultrasound scanning demonstrates a large amount of ascites within the left lower abdominal quadrant. The right lower abdomen was prepped and draped in the usual sterile fashion. 1% lidocaine with epinephrine was used for local anesthesia. Following this, a Safe-T-Centesis catheter was introduced. An ultrasound image was saved for documentation purposes. The paracentesis was performed. The catheter was removed and a dressing was applied. The patient tolerated the procedure well without immediate post procedural complication. FINDINGS: A total of approximately 3.2 L of clear yellow fluid was removed. IMPRESSION: Successful ultrasound-guided paracentesis yielding 3.2 liters of peritoneal fluid. Read by: Brayton El PA-C Electronically Signed   By: Irish Lack M.D.   On: 09/22/2015 10:39   US Paracentesis  Result Date: 09/18/2015 INDICATION: CHF, recurrent ascites.  Request made for therapeutic paracentesis. EXAM: ULTRASOUND GUIDED THERAPEUTIC PARACENTESIS MEDICATIONS: None. COMPLICATIONS: None immediate. PROCEDURE: Informed written consent was obtained from the patient after a discussion of the risks, benefits and alternatives to treatment. A timeout was performed prior to the initiation of the procedure. Initial ultrasound scanning demonstrates a moderate-to-large amount of ascites within the left lower abdominal quadrant. The left lower abdomen was prepped and draped in the usual sterile fashion. 1% lidocaine was used for local anesthesia. Following this, a Yueh catheter was introduced. An ultrasound image was saved for documentation purposes. The paracentesis was performed. The catheter was removed and a dressing was applied. The patient tolerated the procedure well without immediate post procedural complication. FINDINGS: A total of  approximately 4.6 liters of slightly hazy, amber fluid was removed. IMPRESSION: Successful ultrasound-guided therapeutic paracentesis yielding 4.6 liters of peritoneal fluid. Read by: Jeananne Rama, PA-C Electronically Signed   By: Jolaine Click M.D.   On: 09/18/2015 11:56   US Paracentesis  Result Date: 08/26/2015 INDICATION: CHF, recurrent ascites.  Request made for therapeutic paracentesis. EXAM: ULTRASOUND GUIDED THERAPEUTIC PARACENTESIS MEDICATIONS: None. COMPLICATIONS: None immediate. PROCEDURE: Informed written consent was obtained from the patient after a discussion of the risks, benefits and alternatives to treatment. A timeout was performed prior to the initiation of the procedure. Initial ultrasound scanning demonstrates a moderate-to-large amount of ascites within the left lower abdominal quadrant. The left lower abdomen was prepped and draped in the usual sterile fashion. 1% lidocaine was used for local anesthesia. Following this, a Yueh catheter was introduced. An ultrasound image was saved for documentation  purposes. The paracentesis was performed. The catheter was removed and a dressing was applied. The patient tolerated the procedure well without immediate post procedural complication. FINDINGS: A total of approximately 4.6 liters of slightly hazy, amber fluid was removed. IMPRESSION: Successful ultrasound-guided therapeutic paracentesis yielding 4.6 liters of peritoneal fluid. Read by: Jeananne Rama, PA-C Electronically Signed   By: Richarda Overlie M.D.   On: 08/26/2015 11:33    PHYSICAL EXAM General: NAD Neck: JVP 12-14 cm, no thyromegaly or thyroid nodule.  Lungs: Mild dependent crackles. CV: Lateral PMI.  Heart regular S1/S2, no S3/S4, no murmur.  No peripheral edema. Abdomen: Soft, nontender, no hepatosplenomegaly.  Nondistended.  Neurologic: Alert and oriented x 3. No asterixis.  Psych: Normal affect. Extremities: No clubbing or cyanosis.   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 1. Acute on chronic systolic CHF: Echo this admission with EF 20% and moderately dilated/severely dysfunctional RV. He has prominent RV failure. Has Medtronic ICD.  He was admitted with severe orthopnea, has since felt better.  Paracentesis 8/28 with 3.2 L removed.  He is still volume overloaded on exam with JVD.  - Continue Lasix 80 mg IV bid, add dose of metolazone today. - Increase spironolactone to 25 mg bid (ascites has been accumulating rapidly recently).   - KCl 40 bid => he has required fairly aggressive K replacement in past, adding spironolactone should help.  - Can restart Coreg at home dose 1.5625 mg bid. - He is not on ACEI/ARB given elevated creatinine.  Digoxin would be difficult to use with renal insufficiency.  - Plan for RHC today to assess cardiac output and filling pressures.  - Difficult situation with heart, liver, and renal failure.  Not candidate for LVAD with significant renal and RV failure.  May need to think about palliative care in the near future. 2. AKI on CKD stage III: Baseline creatinine around 2.2.  Up to 2.7 on admission, now down to 2.0.  Possible that renal venous decongestion with diuresis/paracentesis will help.   3. CAD: Stable, no chest pain.  Continue ASA 81 and statin.  4. Hypothyroidism: In setting of amiodarone use.  TSH quite high this admission.  Increased levothyroxine to 75 mcg daily.  5. Cirrhosis: ?Etiology, may be 2/2 to RV failure.  Never a heavy drinker, viral hepatitis serologies negative.  He already had cirrhosis on imaging prior to amiodarone initiation.   - NH3 high at admission but no confusion.  He is on lactulose. Repeat NH3 tomorrow.  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  Decreased amiodarone to 100 mg daily with cirrhosis (though think  cirrhosis pre-dated amiodarone).  7. S/p MV repair: Stable echo this admission.   Marca Ancona 09/23/2015 7:34 AM

## 2015-09-23 NOTE — Research (Signed)
I will use this hospitalization as 70-month follow-up for REDS@ Discharge Study. Patient has now completed the REDS@Discharge  Study.

## 2015-09-23 NOTE — Progress Notes (Signed)
Pt's BP in the mid 80's to 90's at 11 1111 this am informed Mardelle Matte PA and asked if ok to given am meds coreg, lasix due this am, instructed it was ok to give.  Will continue to monitor.  Amanda Pea, Charity fundraiser.

## 2015-09-23 NOTE — Progress Notes (Signed)
Pt back to floor from cath lab procedure.  A& 0x4.  Family at the bedside.    [name] Roberts, Charity fundraiser.

## 2015-09-23 NOTE — H&P (View-Only) (Signed)
Patient ID: [name] Roberts, male   DOB: November 30, 1940, 75 y.o.   MRN: [id number]    SUBJECTIVE: Paracentesis 8/28 with 3.2 L removed.  I/Os negative but not markedly so with Lasix 80 mg IV bid.  Creatinine down to 2.01.    He is feeling fine this morning, denies dyspnea at rest or walking around his room.   Echo (09/22/15): EF 20% with moderate LV dilation, moderately dilated RV with severely decreased systolic function, IVC dilated, PASP 56 mmHg.   Scheduled Meds: . allopurinol  100 mg Oral Daily  . ALPRAZolam  0.5 mg Oral QHS  . amiodarone  100 mg Oral Daily  . aspirin EC  81 mg Oral QHS  . atorvastatin  20 mg Oral q1800  . carvedilol  1.5625 mg Oral BID WC  . fluticasone  2 spray Each Nare Daily  . furosemide  80 mg Intravenous BID  . heparin  5,000 Units Subcutaneous Q8H  . lactulose  20 g Oral TID  . levothyroxine  75 mcg Oral QAC breakfast  . Melatonin  3 mg Oral QHS  . metolazone  2.5 mg Oral Once  . niacin  500 mg Oral QHS  . polyvinyl alcohol  1 drop Both Eyes Daily  . potassium chloride  40 mEq Oral BID  . sodium chloride flush  3 mL Intravenous Q12H  . sodium chloride flush  3 mL Intravenous Q12H  . spironolactone  25 mg Oral BID   Continuous Infusions: . sodium chloride     PRN Meds:.sodium chloride, sodium chloride, acetaminophen, sodium chloride flush, sodium chloride flush    Vitals:   09/22/15 1504 09/22/15 2038 09/22/15 2341 09/23/15 0432  BP: 101/76 (!) 95/53 (!) 101/59 (!) 98/57  Pulse: 68 69 71 68  Resp: 18 16 16 16   Temp: 97.8 F (36.6 C) 98.6 F (37 C) 97.7 F (36.5 C) 97.9 F (36.6 C)  TempSrc: Oral Oral Oral Oral  SpO2: 100% 99% 97% 96%  Weight:    148 lb 1.6 oz (67.2 kg)  Height:        Intake/Output Summary (Last 24 hours) at 09/23/15 0734 Last data filed at 09/23/15 0600  Gross per 24 hour  Intake              720 ml  Output             1450 ml  Net             -730 ml    LABS: Basic Metabolic Panel:  Recent Labs   09/22/15 0457 09/23/15 0607  NA 135 138  K 3.1* 3.8  CL 99* 102  CO2 26 26  GLUCOSE 98 96  BUN 45* 38*  CREATININE 2.43* 2.01*  CALCIUM 8.6* 8.6*  MG 2.6* 2.5*   Liver Function Tests:  Recent Labs  09/20/15 1748  AST 67*  ALT 54  ALKPHOS 158*  BILITOT 1.4*  PROT 6.6  ALBUMIN 3.1*    Recent Labs  09/20/15 1748  LIPASE 25   CBC:  Recent Labs  09/20/15 1745 09/23/15 0607  WBC 8.0 7.0  HGB 12.7* 11.5*  HCT 40.0 35.8*  MCV 97.1 95.0  PLT 159 146*   Cardiac Enzymes:  Recent Labs  09/20/15 2110 09/21/15 0247 09/21/15 0819  TROPONINI 0.03* <0.03 <0.03   BNP: Invalid input(s): POCBNP D-Dimer: No results for input(s): DDIMER in the last 72 hours. Hemoglobin A1C: No results for input(s): HGBA1C in the last 72 hours. Fasting Lipid  Panel: No results for input(s): CHOL, HDL, LDLCALC, TRIG, CHOLHDL, LDLDIRECT in the last 72 hours. Thyroid Function Tests:  Recent Labs  09/21/15 0247  TSH 39.379*   Anemia Panel: No results for input(s): VITAMINB12, FOLATE, FERRITIN, TIBC, IRON, RETICCTPCT in the last 72 hours.  RADIOLOGY: Dg Chest 2 View  Result Date: 09/20/2015 CLINICAL DATA:  Shortness of Breath starting today. EXAM: CHEST  2 VIEW COMPARISON:  05/31/2015. FINDINGS: Lungs are hyperexpanded. The lungs are clear wiithout focal pneumonia, edema, pneumothorax or pleural effusion. The cardio pericardial silhouette is enlarged. Status post CABG and cardiac valve replacement. Left pacer/AICD remains in place. The visualized bony structures of the thorax are intact. IMPRESSION: No acute cardiopulmonary findings. Electronically Signed   By: Kennith Center M.D.   On: 09/20/2015 17:38   US Paracentesis  Result Date: 09/22/2015 INDICATION: Congestive heart failure. Recurrent ascites. Request therapeutic paracentesis. EXAM: ULTRASOUND GUIDED LEFT LOWER QUADRANT PARACENTESIS MEDICATIONS: None. COMPLICATIONS: None immediate. PROCEDURE: Informed written consent was  obtained from the patient after a discussion of the risks, benefits and alternatives to treatment. A timeout was performed prior to the initiation of the procedure. Initial ultrasound scanning demonstrates a large amount of ascites within the left lower abdominal quadrant. The right lower abdomen was prepped and draped in the usual sterile fashion. 1% lidocaine with epinephrine was used for local anesthesia. Following this, a Safe-T-Centesis catheter was introduced. An ultrasound image was saved for documentation purposes. The paracentesis was performed. The catheter was removed and a dressing was applied. The patient tolerated the procedure well without immediate post procedural complication. FINDINGS: A total of approximately 3.2 L of clear yellow fluid was removed. IMPRESSION: Successful ultrasound-guided paracentesis yielding 3.2 liters of peritoneal fluid. Read by: Brayton El PA-C Electronically Signed   By: Irish Lack M.D.   On: 09/22/2015 10:39   US Paracentesis  Result Date: 09/18/2015 INDICATION: CHF, recurrent ascites.  Request made for therapeutic paracentesis. EXAM: ULTRASOUND GUIDED THERAPEUTIC PARACENTESIS MEDICATIONS: None. COMPLICATIONS: None immediate. PROCEDURE: Informed written consent was obtained from the patient after a discussion of the risks, benefits and alternatives to treatment. A timeout was performed prior to the initiation of the procedure. Initial ultrasound scanning demonstrates a moderate-to-large amount of ascites within the left lower abdominal quadrant. The left lower abdomen was prepped and draped in the usual sterile fashion. 1% lidocaine was used for local anesthesia. Following this, a Yueh catheter was introduced. An ultrasound image was saved for documentation purposes. The paracentesis was performed. The catheter was removed and a dressing was applied. The patient tolerated the procedure well without immediate post procedural complication. FINDINGS: A total of  approximately 4.6 liters of slightly hazy, amber fluid was removed. IMPRESSION: Successful ultrasound-guided therapeutic paracentesis yielding 4.6 liters of peritoneal fluid. Read by: Jeananne Rama, PA-C Electronically Signed   By: Jolaine Click M.D.   On: 09/18/2015 11:56   US Paracentesis  Result Date: 08/26/2015 INDICATION: CHF, recurrent ascites.  Request made for therapeutic paracentesis. EXAM: ULTRASOUND GUIDED THERAPEUTIC PARACENTESIS MEDICATIONS: None. COMPLICATIONS: None immediate. PROCEDURE: Informed written consent was obtained from the patient after a discussion of the risks, benefits and alternatives to treatment. A timeout was performed prior to the initiation of the procedure. Initial ultrasound scanning demonstrates a moderate-to-large amount of ascites within the left lower abdominal quadrant. The left lower abdomen was prepped and draped in the usual sterile fashion. 1% lidocaine was used for local anesthesia. Following this, a Yueh catheter was introduced. An ultrasound image was saved for documentation  purposes. The paracentesis was performed. The catheter was removed and a dressing was applied. The patient tolerated the procedure well without immediate post procedural complication. FINDINGS: A total of approximately 4.6 liters of slightly hazy, amber fluid was removed. IMPRESSION: Successful ultrasound-guided therapeutic paracentesis yielding 4.6 liters of peritoneal fluid. Read by: Jeananne Rama, PA-C Electronically Signed   By: Richarda Overlie M.D.   On: 08/26/2015 11:33    PHYSICAL EXAM General: NAD Neck: JVP 12-14 cm, no thyromegaly or thyroid nodule.  Lungs: Mild dependent crackles. CV: Lateral PMI.  Heart regular S1/S2, no S3/S4, no murmur.  No peripheral edema. Abdomen: Soft, nontender, no hepatosplenomegaly.  Nondistended.  Neurologic: Alert and oriented x 3. No asterixis.  Psych: Normal affect. Extremities: No clubbing or cyanosis.   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 1. Acute on chronic systolic CHF: Echo this admission with EF 20% and moderately dilated/severely dysfunctional RV. He has prominent RV failure. Has Medtronic ICD.  He was admitted with severe orthopnea, has since felt better.  Paracentesis 8/28 with 3.2 L removed.  He is still volume overloaded on exam with JVD.  - Continue Lasix 80 mg IV bid, add dose of metolazone today. - Increase spironolactone to 25 mg bid (ascites has been accumulating rapidly recently).   - KCl 40 bid => he has required fairly aggressive K replacement in past, adding spironolactone should help.  - Can restart Coreg at home dose 1.5625 mg bid. - He is not on ACEI/ARB given elevated creatinine.  Digoxin would be difficult to use with renal insufficiency.  - Plan for RHC today to assess cardiac output and filling pressures.  - Difficult situation with heart, liver, and renal failure.  Not candidate for LVAD with significant renal and RV failure.  May need to think about palliative care in the near future. 2. AKI on CKD stage III: Baseline creatinine around 2.2.  Up to 2.7 on admission, now down to 2.0.  Possible that renal venous decongestion with diuresis/paracentesis will help.   3. CAD: Stable, no chest pain.  Continue ASA 81 and statin.  4. Hypothyroidism: In setting of amiodarone use.  TSH quite high this admission.  Increased levothyroxine to 75 mcg daily.  5. Cirrhosis: ?Etiology, may be 2/2 to RV failure.  Never a heavy drinker, viral hepatitis serologies negative.  He already had cirrhosis on imaging prior to amiodarone initiation.   - NH3 high at admission but no confusion.  He is on lactulose. Repeat NH3 tomorrow.  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  Decreased amiodarone to 100 mg daily with cirrhosis (though think  cirrhosis pre-dated amiodarone).  7. S/p MV repair: Stable echo this admission.   Marca Ancona 09/23/2015 7:34 AM

## 2015-09-23 NOTE — Care Management Important Message (Signed)
Important Message  Patient Details  Name: [name] Roberts MRN: [id number] Date of Birth: 1940/02/20   Medicare Important Message Given:  Yes    Shakeeta Godette Stefan Church 09/23/2015, 9:43 AM

## 2015-09-24 ENCOUNTER — Ambulatory Visit: Payer: Medicare HMO | Admitting: Family Medicine

## 2015-09-24 DIAGNOSIS — F419 Anxiety disorder, unspecified: Secondary | ICD-10-CM

## 2015-09-24 LAB — BASIC METABOLIC PANEL
Anion gap: 9 (ref 5–15)
BUN: 37 mg/dL — AB (ref 6–20)
CALCIUM: 9 mg/dL (ref 8.9–10.3)
CO2: 28 mmol/L (ref 22–32)
CREATININE: 2.06 mg/dL — AB (ref 0.61–1.24)
Chloride: 101 mmol/L (ref 101–111)
GFR, EST AFRICAN AMERICAN: 35 mL/min — AB (ref >60)
GFR, EST NON AFRICAN AMERICAN: 30 mL/min — AB (ref >60)
Glucose, Bld: 86 mg/dL (ref 65–99)
Potassium: 4.4 mmol/L (ref 3.5–5.1)
SODIUM: 138 mmol/L (ref 135–145)

## 2015-09-24 LAB — CBC
HEMATOCRIT: 38.3 % — AB (ref 39.0–52.0)
Hemoglobin: 12.4 g/dL — ABNORMAL LOW (ref 13.0–17.0)
MCH: 30.9 pg (ref 26.0–34.0)
MCHC: 32.4 g/dL (ref 30.0–36.0)
MCV: 95.5 fL (ref 78.0–100.0)
PLATELETS: 149 10*3/uL — AB (ref 150–400)
RBC: 4.01 MIL/uL — AB (ref 4.22–5.81)
RDW: 16.9 % — ABNORMAL HIGH (ref 11.5–15.5)
WBC: 6.9 10*3/uL (ref 4.0–10.5)

## 2015-09-24 LAB — AMMONIA: Ammonia: 91 umol/L — ABNORMAL HIGH (ref 9–35)

## 2015-09-24 LAB — MAGNESIUM: MAGNESIUM: 2.6 mg/dL — AB (ref 1.7–2.4)

## 2015-09-24 MED ORDER — POTASSIUM CHLORIDE ER 10 MEQ PO TBCR: EXTENDED_RELEASE_TABLET | ORAL | 6 refills | Status: DC

## 2015-09-24 MED ORDER — LACTULOSE 10 GM/15ML PO SOLN
20.0000 g | Freq: Three times a day (TID) | ORAL | 6 refills | Status: DC
Start: 2015-09-24 — End: 2015-11-24

## 2015-09-24 MED ORDER — TORSEMIDE 20 MG PO TABS
60.0000 mg | ORAL_TABLET | Freq: Two times a day (BID) | ORAL | Status: DC
  Administered 2015-09-24: 60 mg via ORAL
  Filled 2015-09-24: qty 3

## 2015-09-24 MED ORDER — DIGOXIN 125 MCG PO TABS
0.0625 mg | ORAL_TABLET | Freq: Every day | ORAL | Status: DC
  Administered 2015-09-24: 0.0625 mg via ORAL
  Filled 2015-09-24: qty 1

## 2015-09-24 MED ORDER — CARVEDILOL 3.125 MG PO TABS: 1.5625 mg | ORAL_TABLET | Freq: Two times a day (BID) | ORAL | 6 refills | Status: DC

## 2015-09-24 MED ORDER — DIGOXIN 62.5 MCG PO TABS: 0.0625 mg | ORAL_TABLET | Freq: Every day | ORAL | 6 refills | Status: DC

## 2015-09-24 MED ORDER — SPIRONOLACTONE 25 MG PO TABS: 25.0000 mg | ORAL_TABLET | Freq: Two times a day (BID) | ORAL | 6 refills | Status: DC

## 2015-09-24 MED ORDER — AMIODARONE HCL 200 MG PO TABS: 100.0000 mg | ORAL_TABLET | Freq: Every day | ORAL | 6 refills | Status: DC

## 2015-09-24 MED ORDER — TORSEMIDE 20 MG PO TABS: 60.0000 mg | ORAL_TABLET | Freq: Two times a day (BID) | ORAL | 6 refills | Status: DC

## 2015-09-24 MED ORDER — METOLAZONE 2.5 MG PO TABS: ORAL_TABLET | ORAL | 3 refills | Status: DC

## 2015-09-24 MED ORDER — LEVOTHYROXINE SODIUM 50 MCG PO TABS: 75.0000 ug | ORAL_TABLET | Freq: Every day | ORAL | 0 refills | Status: AC

## 2015-09-24 NOTE — Care Management Note (Signed)
Case Management Note  Patient Details  Name: CASPAR TOKARCZYK MRN: [id number] Date of Birth: 1940-02-21  Subjective/Objective:     Admitted with CHF               Action/Plan: Patient lives at home with spouse; has private insurance with Community education officer with prescription drug coverage; pharmacy of choice is [facility]; pt reports no problem getting his medication. He would benefit from a Disease Management Program for CHF but patient refused all HHC services at this time. He has scales at home and knows to weigh himself daily and monitor his sodium intake, Expected Discharge Date:  09/24/15               Expected Discharge Plan:  Home w Home Health Services   Discharge planning Services  CM Consult  Choice offered to:  Patient, Adult Children, Spouse  HH Arranged:  Patient Refused  Status of Service:  Completed, signed off  Reola Mosher [phone] 09/24/2015, 11:41 AM

## 2015-09-24 NOTE — Discharge Summary (Signed)
PATIENT DETAILS Name: [name] Roberts Age: 75 y.o. Sex: male Date of Birth: [birth year] MRN: [id number]. Admitting Physician: Lorretta Harp, MD WUJ:[id number] Opalski, DO  Admit Date: 09/20/2015 Discharge date: 09/24/2015  Recommendations for Outpatient Follow-up:  1. Follow up with PCP in 1-2 weeks 2. Follow up with the CHF clinic as instructed 3. Please obtain BMP/CBC in one week 4. Repeat TSH in 3 months- dose of levothyroxine has been adjusted  Admitted From:  Home  Disposition: Home   Home Health: Yes  Equipment/Devices: None  Discharge Condition: Stable  CODE STATUS: DNI  Diet recommendation:  Heart Healthy   Brief Summary: See H&P, Labs, Consult and Test reports for all details in brief, Patient is a 75 y.o. male with history of chronic systolic heart failure secondary to ischemic cardiomyopathy (EF 20% by echo on May 2017), history of CAD status post CABG, chronic kidney disease stage III admitted with acute on chronic systolic heart failure  Brief Hospital Course: Acute on chronic systolic (congestive) heart failure(EF 20% by echo on May 2017): Much more compensated following intravenous diuretics. Cardiology consulted, underwent a right heart catheterization on 8/29. Recommendations are to transition to Demadex, Aldactone on discharge. Cardiology has also added low-dose digoxin. Plans are for discharge today, with close follow-up with CHF clinic.   Active Problems: Acute on chronic kidney disease stage III: Likely secondary to above, creatinine has down trended and is almost close to his usual baseline-cautiously continue diuretics-blood pressure soft. Follow electrolytes closely in the outpatient setting.  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 Status post paracentesis on 8/28 with removal of 3.2 L of fluid.  CAD status post CABG: No chest pain, continue aspirin, Coreg and statin.  Frequent PVCs/nonsustained VT: On  amiodarone  Mitral Valve Disease: S/P Mitral Valve Ring 2007  Automatic implantable cardioverter-defibrillator in situ (Dr Ladona Ridgel- EP-Cards)  Hypothyroidism: Continue levothyroxine-dose increased to 75 MCG- recheck TSH in 3 months  Anxiety: Continue Xanax  Procedures/Studies: Echo (09/22/15)>>  EF 20% with moderate LV dilation, moderately dilated RV with severely decreased systolic function, IVC dilated, PASP 56 mmHg.   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    Complete by:  As directed   Diet - low sodium heart healthy    Complete by:  As directed   Discharge instructions    Complete by:  As directed   You have  Congestive Heart Failure: Please call your Cardiologist or Primary MD-Anytime you have any of the following symptoms:   1) 3 pound weight gain in 24 hours or 5 pounds in 1 week  2) shortness of breath, with or without a dry hacking  cough 3) swelling in the hands, feet or stomach 4) if you have to sleep on extra pillows at night in order to breathe  Follow cardiac low salt diet and 1.5 lit/day fluid restriction.  Follow with Primary MD  [name], DO  and other consultant's as instructed your Hospitalist MD  Please get a complete blood count and chemistry panel checked by your Primary MD at your next visit, and again as instructed by your Primary MD.  Get Medicines reviewed and adjusted: Please take all your medications with you for your next visit with your Primary MD  Laboratory/radiological data: Please request your Primary MD to go over all hospital tests and procedure/radiological results at the follow up, please ask your Primary MD to get all Hospital records sent to his/her office.  In some cases, they will be blood work, cultures and biopsy results pending at the time of your discharge. Please request that your primary care M.D. follows up on these results.  Also Note the following: If you experience worsening of your admission symptoms, develop shortness of breath, life threatening emergency, suicidal or homicidal thoughts you must seek medical attention immediately by calling 911 or calling your MD immediately  if symptoms less severe.  You must read complete instructions/literature along with all the possible adverse reactions/side effects for all the Medicines you take and that have been prescribed to you. Take any new Medicines after you have completely understood and accpet all the possible adverse reactions/side effects.   Do not drive when taking Pain medications or sleeping medications (Benzodaizepines)  Do not take more than prescribed Pain, Sleep and Anxiety Medications. It is not advisable to combine anxiety,sleep and pain medications without talking with your primary care practitioner  Special Instructions: If you have smoked or chewed Tobacco  in the last 2 yrs please stop smoking, stop any  regular Alcohol  and or any Recreational drug use.  Wear Seat belts while driving.  Please note: You were cared for by a hospitalist during your hospital stay. Once you are discharged, your primary care physician will handle any further medical issues. Please note that NO REFILLS for any discharge medications will be authorized once you are discharged, as it is imperative that you return to your primary care physician (or establish a relationship with a primary care physician if you do not have one) for your post hospital discharge needs so that they can reassess your need for medications and monitor your lab values.   Heart Failure patients record your daily weight using the same scale at the same time of day    Complete by:  As directed   Increase activity slowly    Complete by:  As directed   STOP any activity that causes chest pain, shortness of breath, dizziness, sweating, or exessive weakness    Complete by:  As directed       Medication List    TAKE these medications   allopurinol 100 MG tablet Commonly known as:  ZYLOPRIM TAKE 1 TABLET BY MOUTH DAILY.   ALPRAZolam 1 MG tablet Commonly known as:  XANAX Take 1/2 to 1 tab nightly before bed. What changed:  how much to take  how to take this  when to  take this  additional instructions   amiodarone 200 MG tablet Commonly known as:  PACERONE Take 0.5 tablets (100 mg total) by mouth daily. What changed:  how much to take   aspirin EC 81 MG tablet Take 81 mg by mouth at bedtime.   atorvastatin 20 MG tablet Commonly known as:  LIPITOR Take 1 tablet (20 mg total) by mouth daily at 6 PM.   carvedilol 3.125 MG tablet Commonly known as:  COREG Take 0.5 tablets (1.5625 mg total) by mouth 2 (two) times daily with a meal. What changed:  how much to take   Digoxin 62.5 MCG Tabs Take 0.0625 mg by mouth daily.   fluticasone 50 MCG/ACT nasal spray Commonly known as:  FLONASE Place 2 sprays into both nostrils daily.     lactulose 10 GM/15ML solution Commonly known as:  CHRONULAC Take 30 mLs (20 g total) by mouth 3 (three) times daily.   levothyroxine 50 MCG tablet Commonly known as:  SYNTHROID, LEVOTHROID Take 1.5 tablets (75 mcg total) by mouth daily. What changed:  how much to take   Melatonin 3 MG Caps Take 3 mg by mouth at bedtime.   metolazone 2.5 MG tablet Commonly known as:  ZAROXOLYN As directed by Heart Failure Clinic What changed:  how much to take  how to take this  when to take this  additional instructions   niacin 500 MG CR tablet Commonly known as:  NIASPAN Take 1 tablet (500 mg total) by mouth at bedtime.   potassium chloride 10 MEQ tablet Commonly known as:  K-DUR Take 4 tablets (40 meq) q am and 2 tablets (20 meq) every evening. What changed:  how much to take  how to take this  when to take this  additional instructions   REFRESH 1.4-0.6 % ophthalmic solution Generic drug:  polyvinyl alcohol-povidone Place 1 drop into the left eye daily.   spironolactone 25 MG tablet Commonly known as:  ALDACTONE Take 1 tablet (25 mg total) by mouth 2 (two) times daily.   torsemide 20 MG tablet Commonly known as:  DEMADEX Take 3 tablets (60 mg total) by mouth 2 (two) times daily.      Follow-up Information    [name], DO.   Specialty:  Family Medicine Contact information: [address] [place] [address] [phone]        Marca Ancona, MD Follow up on 09/30/2015.   Specialty:  Cardiology Why:  at 0930 am for post hospital follow up. Please bring all of your medications to your visit. The code for parking is 0030. Contact information: [address] [place] [address] [phone]        [facility] Follow up on 09/26/2015.   Specialty:  Cardiology Why:  at 1045 for labs.  Needs BMET and Dig level.  Contact information: [address] [id number] mc [place] [address] [phone]         Allergies  Allergen Reactions  . Erythromycin Base Swelling    Watery red eyes and red streaks down the face    Consultations:   cardiology  Other Procedures/Studies: Dg Chest 2 View  Result Date: 09/20/2015 CLINICAL DATA:  Shortness of Breath starting today. EXAM: CHEST  2 VIEW COMPARISON:  05/31/2015. FINDINGS: Lungs are hyperexpanded. The lungs are clear wiithout focal pneumonia, edema, pneumothorax or pleural effusion. The cardio pericardial silhouette is enlarged. Status post CABG and cardiac valve replacement. Left pacer/AICD remains  in place. The visualized bony structures of the thorax are intact. IMPRESSION: No acute cardiopulmonary findings. Electronically Signed   By: Kennith Center M.D.   On: 09/20/2015 17:38   US Paracentesis  Result Date: 09/22/2015 INDICATION: Congestive heart failure. Recurrent ascites. Request therapeutic paracentesis. EXAM: ULTRASOUND GUIDED LEFT LOWER QUADRANT PARACENTESIS MEDICATIONS: None. COMPLICATIONS: None immediate. PROCEDURE: Informed written consent was obtained from the patient after a discussion of the risks, benefits and alternatives to treatment. A timeout was performed prior to the initiation of the procedure. Initial ultrasound scanning demonstrates a large amount of ascites within the left lower abdominal quadrant. The right lower abdomen was prepped and draped in the usual sterile fashion. 1% lidocaine with epinephrine was used for local anesthesia. Following this, a Safe-T-Centesis catheter was introduced. An ultrasound image was saved for documentation purposes. The paracentesis was performed. The catheter was removed and a dressing was applied. The patient tolerated the procedure well without immediate post procedural complication. FINDINGS: A total of approximately 3.2 L of clear yellow fluid was removed. IMPRESSION: Successful ultrasound-guided paracentesis yielding 3.2 liters of peritoneal  fluid. Read by: Brayton El PA-C Electronically Signed   By: Irish Lack M.D.   On: 09/22/2015 10:39   US Paracentesis  Result Date: 09/18/2015 INDICATION: CHF, recurrent ascites.  Request made for therapeutic paracentesis. EXAM: ULTRASOUND GUIDED THERAPEUTIC PARACENTESIS MEDICATIONS: None. COMPLICATIONS: None immediate. PROCEDURE: Informed written consent was obtained from the patient after a discussion of the risks, benefits and alternatives to treatment. A timeout was performed prior to the initiation of the procedure. Initial ultrasound scanning demonstrates a moderate-to-large amount of ascites within the left lower abdominal quadrant. The left lower abdomen was prepped and draped in the usual sterile fashion. 1% lidocaine was used for local anesthesia. Following this, a Yueh catheter was introduced. An ultrasound image was saved for documentation purposes. The paracentesis was performed. The catheter was removed and a dressing was applied. The patient tolerated the procedure well without immediate post procedural complication. FINDINGS: A total of approximately 4.6 liters of slightly hazy, amber fluid was removed. IMPRESSION: Successful ultrasound-guided therapeutic paracentesis yielding 4.6 liters of peritoneal fluid. Read by: Jeananne Rama, PA-C Electronically Signed   By: Jolaine Click M.D.   On: 09/18/2015 11:56   US Paracentesis  Result Date: 08/26/2015 INDICATION: CHF, recurrent ascites.  Request made for therapeutic paracentesis. EXAM: ULTRASOUND GUIDED THERAPEUTIC PARACENTESIS MEDICATIONS: None. COMPLICATIONS: None immediate. PROCEDURE: Informed written consent was obtained from the patient after a discussion of the risks, benefits and alternatives to treatment. A timeout was performed prior to the initiation of the procedure. Initial ultrasound scanning demonstrates a moderate-to-large amount of ascites within the left lower abdominal quadrant. The left lower abdomen was prepped and draped  in the usual sterile fashion. 1% lidocaine was used for local anesthesia. Following this, a Yueh catheter was introduced. An ultrasound image was saved for documentation purposes. The paracentesis was performed. The catheter was removed and a dressing was applied. The patient tolerated the procedure well without immediate post procedural complication. FINDINGS: A total of approximately 4.6 liters of slightly hazy, amber fluid was removed. IMPRESSION: Successful ultrasound-guided therapeutic paracentesis yielding 4.6 liters of peritoneal fluid. Read by: Jeananne Rama, PA-C Electronically Signed   By: Richarda Overlie M.D.   On: 08/26/2015 11:33      TODAY-DAY OF DISCHARGE:  Subjective:   Arlis Dejoy today has no headache,no chest abdominal pain,no new weakness tingling or numbness, feels much better wants to go home today.  Objective:   Blood pressure (!) 78/52, pulse 68, temperature 97.7 F (36.5 C), temperature source Oral, resp. rate 14, height 5\' 6"  (1.676 m), weight 65.4 kg (144 lb 3.2 oz), SpO2 97 %.  Intake/Output Summary (Last 24 hours) at 09/24/15 1111 Last data filed at 09/24/15 0837  Gross per 24 hour  Intake              600 ml  Output             2856 ml  Net            -2256 ml   Filed Weights   09/22/15 0450 09/23/15 0432 09/24/15 0515  Weight: 69.4 kg (153 lb) 67.2 kg (148 lb 1.6 oz) 65.4 kg (144 lb 3.2 oz)    Exam: Awake Alert, Oriented *3, No new F.N deficits, Normal affect [place].AT,PERRAL Supple Neck,No JVD, No cervical lymphadenopathy appriciated.  Symmetrical Chest wall movement, Good air movement bilaterally, CTAB RRR,No Gallops,Rubs or new Murmurs, No Parasternal Heave +ve B.Sounds, Abd Soft, Non tender, No organomegaly appriciated, No rebound -guarding or rigidity. No Cyanosis, Clubbing or edema, No new Rash or bruise   PERTINENT RADIOLOGIC STUDIES: Dg Chest 2 View  Result Date: 09/20/2015 CLINICAL DATA:  Shortness of Breath starting today. EXAM: CHEST  2  VIEW COMPARISON:  05/31/2015. FINDINGS: Lungs are hyperexpanded. The lungs are clear wiithout focal pneumonia, edema, pneumothorax or pleural effusion. The cardio pericardial silhouette is enlarged. Status post CABG and cardiac valve replacement. Left pacer/AICD remains in place. The visualized bony structures of the thorax are intact. IMPRESSION: No acute cardiopulmonary findings. Electronically Signed   By: Kennith CenterEric  Mansell M.D.   On: 09/20/2015 17:38   [place] Paracentesis  Result Date: 09/22/2015 INDICATION: Congestive heart failure. Recurrent ascites. Request therapeutic paracentesis. EXAM: ULTRASOUND GUIDED LEFT LOWER QUADRANT PARACENTESIS MEDICATIONS: None. COMPLICATIONS: None immediate. PROCEDURE: Informed written consent was obtained from the patient after a discussion of the risks, benefits and alternatives to treatment. A timeout was performed prior to the initiation of the procedure. Initial ultrasound scanning demonstrates a large amount of ascites within the left lower abdominal quadrant. The right lower abdomen was prepped and draped in the usual sterile fashion. 1% lidocaine with epinephrine was used for local anesthesia. Following this, a Safe-T-Centesis catheter was introduced. An ultrasound image was saved for documentation purposes. The paracentesis was performed. The catheter was removed and a dressing was applied. The patient tolerated the procedure well without immediate post procedural complication. FINDINGS: A total of approximately 3.2 L of clear yellow fluid was removed. IMPRESSION: Successful ultrasound-guided paracentesis yielding 3.2 liters of peritoneal fluid. Read by: Brayton ElKevin Bruning PA-C Electronically Signed   By: Irish LackGlenn  Yamagata M.D.   On: 09/22/2015 10:39   [place] Paracentesis  Result Date: 09/18/2015 INDICATION: CHF, recurrent ascites.  Request made for therapeutic paracentesis. EXAM: ULTRASOUND GUIDED THERAPEUTIC PARACENTESIS MEDICATIONS: None. COMPLICATIONS: None immediate.  PROCEDURE: Informed written consent was obtained from the patient after a discussion of the risks, benefits and alternatives to treatment. A timeout was performed prior to the initiation of the procedure. Initial ultrasound scanning demonstrates a moderate-to-large amount of ascites within the left lower abdominal quadrant. The left lower abdomen was prepped and draped in the usual sterile fashion. 1% lidocaine was used for local anesthesia. Following this, a Yueh catheter was introduced. An ultrasound image was saved for documentation purposes. The paracentesis was performed. The catheter was removed and a dressing was applied. The patient tolerated the procedure well without immediate post procedural  complication. FINDINGS: A total of approximately 4.6 liters of slightly hazy, amber fluid was removed. IMPRESSION: Successful ultrasound-guided therapeutic paracentesis yielding 4.6 liters of peritoneal fluid. Read by: Jeananne Rama, PA-C Electronically Signed   By: Jolaine Click M.D.   On: 09/18/2015 11:56   US Paracentesis  Result Date: 08/26/2015 INDICATION: CHF, recurrent ascites.  Request made for therapeutic paracentesis. EXAM: ULTRASOUND GUIDED THERAPEUTIC PARACENTESIS MEDICATIONS: None. COMPLICATIONS: None immediate. PROCEDURE: Informed written consent was obtained from the patient after a discussion of the risks, benefits and alternatives to treatment. A timeout was performed prior to the initiation of the procedure. Initial ultrasound scanning demonstrates a moderate-to-large amount of ascites within the left lower abdominal quadrant. The left lower abdomen was prepped and draped in the usual sterile fashion. 1% lidocaine was used for local anesthesia. Following this, a Yueh catheter was introduced. An ultrasound image was saved for documentation purposes. The paracentesis was performed. The catheter was removed and a dressing was applied. The patient tolerated the procedure well without immediate post  procedural complication. FINDINGS: A total of approximately 4.6 liters of slightly hazy, amber fluid was removed. IMPRESSION: Successful ultrasound-guided therapeutic paracentesis yielding 4.6 liters of peritoneal fluid. Read by: Jeananne Rama, PA-C Electronically Signed   By: Richarda Overlie M.D.   On: 08/26/2015 11:33     PERTINENT LAB RESULTS: CBC:  Recent Labs  09/23/15 0607 09/24/15 0259  WBC 7.0 6.9  HGB 11.5* 12.4*  HCT 35.8* 38.3*  PLT 146* 149*   CMET CMP     Component Value Date/Time   NA 138 09/24/2015 0259   K 4.4 09/24/2015 0259   CL 101 09/24/2015 0259   CO2 28 09/24/2015 0259   GLUCOSE 86 09/24/2015 0259   BUN 37 (H) 09/24/2015 0259   CREATININE 2.06 (H) 09/24/2015 0259   CREATININE 2.61 (H) 07/17/2015 0748   CALCIUM 9.0 09/24/2015 0259   PROT 6.6 09/20/2015 1748   ALBUMIN 3.1 (L) 09/20/2015 1748   AST 67 (H) 09/20/2015 1748   ALT 54 09/20/2015 1748   ALKPHOS 158 (H) 09/20/2015 1748   BILITOT 1.4 (H) 09/20/2015 1748   GFRNONAA 30 (L) 09/24/2015 0259   GFRAA 35 (L) 09/24/2015 0259    GFR Estimated Creatinine Clearance: 28.4 mL/min (by C-G formula based on SCr of 2.06 mg/dL). No results for input(s): LIPASE, AMYLASE in the last 72 hours. No results for input(s): CKTOTAL, CKMB, CKMBINDEX, TROPONINI in the last 72 hours. Invalid input(s): POCBNP No results for input(s): DDIMER in the last 72 hours. No results for input(s): HGBA1C in the last 72 hours. No results for input(s): CHOL, HDL, LDLCALC, TRIG, CHOLHDL, LDLDIRECT in the last 72 hours. No results for input(s): TSH, T4TOTAL, T3FREE, THYROIDAB in the last 72 hours.  Invalid input(s): FREET3 No results for input(s): VITAMINB12, FOLATE, FERRITIN, TIBC, IRON, RETICCTPCT in the last 72 hours. Coags: No results for input(s): INR in the last 72 hours.  Invalid input(s): PT Microbiology: Recent Results (from the past 240 hour(s))  Culture, blood (routine x 2)     Status: None (Preliminary result)    Collection Time: 09/20/15  6:45 PM  Result Value Ref Range Status   Specimen Description BLOOD RIGHT ARM  Final   Special Requests BOTTLES DRAWN AEROBIC AND ANAEROBIC 10CC  Final   Culture NO GROWTH 3 DAYS  Final   Report Status PENDING  Incomplete  Culture, blood (routine x 2)     Status: None (Preliminary result)   Collection Time: 09/20/15  6:50 PM  Result Value Ref Range Status   Specimen Description BLOOD RIGHT HAND  Final   Special Requests BOTTLES DRAWN AEROBIC AND ANAEROBIC 5CC  Final   Culture NO GROWTH 3 DAYS  Final   Report Status PENDING  Incomplete    FURTHER DISCHARGE INSTRUCTIONS:  Get Medicines reviewed and adjusted: Please take all your medications with you for your next visit with your Primary MD  Laboratory/radiological data: Please request your Primary MD to go over all hospital tests and procedure/radiological results at the follow up, please ask your Primary MD to get all Hospital records sent to his/her office.  In some cases, they will be blood work, cultures and biopsy results pending at the time of your discharge. Please request that your primary care M.D. goes through all the records of your hospital data and follows up on these results.  Also Note the following: If you experience worsening of your admission symptoms, develop shortness of breath, life threatening emergency, suicidal or homicidal thoughts you must seek medical attention immediately by calling 911 or calling your MD immediately  if symptoms less severe.  You must read complete instructions/literature along with all the possible adverse reactions/side effects for all the Medicines you take and that have been prescribed to you. Take any new Medicines after you have completely understood and accpet all the possible adverse reactions/side effects.   Do not drive when taking Pain medications or sleeping medications (Benzodaizepines)  Do not take more than prescribed Pain, Sleep and Anxiety  Medications. It is not advisable to combine anxiety,sleep and pain medications without talking with your primary care practitioner  Special Instructions: If you have smoked or chewed Tobacco  in the last 2 yrs please stop smoking, stop any regular Alcohol  and or any Recreational drug use.  Wear Seat belts while driving.  Please note: You were cared for by a hospitalist during your hospital stay. Once you are discharged, your primary care physician will handle any further medical issues. Please note that NO REFILLS for any discharge medications will be authorized once you are discharged, as it is imperative that you return to your primary care physician (or establish a relationship with a primary care physician if you do not have one) for your post hospital discharge needs so that they can reassess your need for medications and monitor your lab values.  Total Time spent coordinating discharge including counseling, education and face to face time equals  45 minutes.  SignedJeoffrey Massed 09/24/2015 11:11 AM

## 2015-09-24 NOTE — Progress Notes (Signed)
Patient ID: [name] Roberts, male   DOB: 03/03/1940, 75 y.o.   MRN: [id number]    SUBJECTIVE: Paracentesis 8/28 with 3.2 L removed. Creatinine stable at 2.06.    He is feeling fine this morning, denies dyspnea at rest or walking around his room. Good diuresis yesterday with addition of metolazone, weight down 4 lbs.   Echo (09/22/15): EF 20% with moderate LV dilation, moderately dilated RV with severely decreased systolic function, IVC dilated, PASP 56 mmHg.   RHC Procedural Findings (8/29): Hemodynamics (mmHg) RA mean 10 RV 53/13 PA 51/22, mean 31 PCWP mean 22 Oxygen saturations: PA 55% AO 99% Cardiac Output (Fick) 3.38  Cardiac Index (Fick) 1.93 PVR 2.7 WU  Scheduled Meds: . allopurinol  100 mg Oral Daily  . ALPRAZolam  0.5 mg Oral QHS  . amiodarone  100 mg Oral Daily  . aspirin EC  81 mg Oral QHS  . atorvastatin  20 mg Oral q1800  . carvedilol  1.5625 mg Oral BID WC  . digoxin  0.0625 mg Oral Daily  . fluticasone  2 spray Each Nare Daily  . heparin  5,000 Units Subcutaneous Q8H  . lactulose  20 g Oral TID  . levothyroxine  75 mcg Oral QAC breakfast  . Melatonin  3 mg Oral QHS  . niacin  500 mg Oral QHS  . polyvinyl alcohol  1 drop Both Eyes Daily  . potassium chloride  40 mEq Oral BID  . sodium chloride flush  3 mL Intravenous Q12H  . sodium chloride flush  3 mL Intravenous Q12H  . sodium chloride flush  3 mL Intravenous Q12H  . spironolactone  25 mg Oral BID  . torsemide  60 mg Oral BID   Continuous Infusions:   PRN Meds:.sodium chloride, sodium chloride, sodium chloride, acetaminophen, ondansetron (ZOFRAN) IV, sodium chloride flush, sodium chloride flush, sodium chloride flush    Vitals:   09/23/15 1745 09/23/15 1947 09/24/15 0515 09/24/15 0831  BP: (!) 93/59 (!) 90/51 (!) 92/54 (!) 78/52  Pulse: 60 64 78 68  Resp:  18 18 14   Temp:  98 F (36.7 C) 97.8 F (36.6 C) 97.7 F (36.5 C)  TempSrc:  Oral Oral Oral  SpO2:  100% 100% 97%  Weight:   144 lb 3.2  oz (65.4 kg)   Height:        Intake/Output Summary (Last 24 hours) at 09/24/15 0856 Last data filed at 09/24/15 0837  Gross per 24 hour  Intake              600 ml  Output             2856 ml  Net            -2256 ml    LABS: Basic Metabolic Panel:  Recent Labs  40/98/11 0607 09/24/15 0259  NA 138 138  K 3.8 4.4  CL 102 101  CO2 26 28  GLUCOSE 96 86  BUN 38* 37*  CREATININE 2.01* 2.06*  CALCIUM 8.6* 9.0  MG 2.5* 2.6*   Liver Function Tests: No results for input(s): AST, ALT, ALKPHOS, BILITOT, PROT, ALBUMIN in the last 72 hours. No results for input(s): LIPASE, AMYLASE in the last 72 hours. CBC:  Recent Labs  09/23/15 0607 09/24/15 0259  WBC 7.0 6.9  HGB 11.5* 12.4*  HCT 35.8* 38.3*  MCV 95.0 95.5  PLT 146* 149*   Cardiac Enzymes: No results for input(s): CKTOTAL, CKMB, CKMBINDEX, TROPONINI in the last 72 hours.  BNP: Invalid input(s): POCBNP D-Dimer: No results for input(s): DDIMER in the last 72 hours. Hemoglobin A1C: No results for input(s): HGBA1C in the last 72 hours. Fasting Lipid Panel: No results for input(s): CHOL, HDL, LDLCALC, TRIG, CHOLHDL, LDLDIRECT in the last 72 hours. Thyroid Function Tests: No results for input(s): TSH, T4TOTAL, T3FREE, THYROIDAB in the last 72 hours.  Invalid input(s): FREET3 Anemia Panel: No results for input(s): VITAMINB12, FOLATE, FERRITIN, TIBC, IRON, RETICCTPCT in the last 72 hours.  RADIOLOGY: Dg Chest 2 View  Result Date: 09/20/2015 CLINICAL DATA:  Shortness of Breath starting today. EXAM: CHEST  2 VIEW COMPARISON:  05/31/2015. FINDINGS: Lungs are hyperexpanded. The lungs are clear wiithout focal pneumonia, edema, pneumothorax or pleural effusion. The cardio pericardial silhouette is enlarged. Status post CABG and cardiac valve replacement. Left pacer/AICD remains in place. The visualized bony structures of the thorax are intact. IMPRESSION: No acute cardiopulmonary findings. Electronically Signed   By: Kennith Center M.D.   On: 09/20/2015 17:38   US Paracentesis  Result Date: 09/22/2015 INDICATION: Congestive heart failure. Recurrent ascites. Request therapeutic paracentesis. EXAM: ULTRASOUND GUIDED LEFT LOWER QUADRANT PARACENTESIS MEDICATIONS: None. COMPLICATIONS: None immediate. PROCEDURE: Informed written consent was obtained from the patient after a discussion of the risks, benefits and alternatives to treatment. A timeout was performed prior to the initiation of the procedure. Initial ultrasound scanning demonstrates a large amount of ascites within the left lower abdominal quadrant. The right lower abdomen was prepped and draped in the usual sterile fashion. 1% lidocaine with epinephrine was used for local anesthesia. Following this, a Safe-T-Centesis catheter was introduced. An ultrasound image was saved for documentation purposes. The paracentesis was performed. The catheter was removed and a dressing was applied. The patient tolerated the procedure well without immediate post procedural complication. FINDINGS: A total of approximately 3.2 L of clear yellow fluid was removed. IMPRESSION: Successful ultrasound-guided paracentesis yielding 3.2 liters of peritoneal fluid. Read by: Brayton El PA-C Electronically Signed   By: Irish Lack M.D.   On: 09/22/2015 10:39   US Paracentesis  Result Date: 09/18/2015 INDICATION: CHF, recurrent ascites.  Request made for therapeutic paracentesis. EXAM: ULTRASOUND GUIDED THERAPEUTIC PARACENTESIS MEDICATIONS: None. COMPLICATIONS: None immediate. PROCEDURE: Informed written consent was obtained from the patient after a discussion of the risks, benefits and alternatives to treatment. A timeout was performed prior to the initiation of the procedure. Initial ultrasound scanning demonstrates a moderate-to-large amount of ascites within the left lower abdominal quadrant. The left lower abdomen was prepped and draped in the usual sterile fashion. 1% lidocaine was used for  local anesthesia. Following this, a Yueh catheter was introduced. An ultrasound image was saved for documentation purposes. The paracentesis was performed. The catheter was removed and a dressing was applied. The patient tolerated the procedure well without immediate post procedural complication. FINDINGS: A total of approximately 4.6 liters of slightly hazy, amber fluid was removed. IMPRESSION: Successful ultrasound-guided therapeutic paracentesis yielding 4.6 liters of peritoneal fluid. Read by: Jeananne Rama, PA-C Electronically Signed   By: Jolaine Click M.D.   On: 09/18/2015 11:56   US Paracentesis  Result Date: 08/26/2015 INDICATION: CHF, recurrent ascites.  Request made for therapeutic paracentesis. EXAM: ULTRASOUND GUIDED THERAPEUTIC PARACENTESIS MEDICATIONS: None. COMPLICATIONS: None immediate. PROCEDURE: Informed written consent was obtained from the patient after a discussion of the risks, benefits and alternatives to treatment. A timeout was performed prior to the initiation of the procedure. Initial ultrasound scanning demonstrates a moderate-to-large amount of ascites within the left lower abdominal  quadrant. The left lower abdomen was prepped and draped in the usual sterile fashion. 1% lidocaine was used for local anesthesia. Following this, a Yueh catheter was introduced. An ultrasound image was saved for documentation purposes. The paracentesis was performed. The catheter was removed and a dressing was applied. The patient tolerated the procedure well without immediate post procedural complication. FINDINGS: A total of approximately 4.6 liters of slightly hazy, amber fluid was removed. IMPRESSION: Successful ultrasound-guided therapeutic paracentesis yielding 4.6 liters of peritoneal fluid. Read by: Jeananne RamaKevin Allred, PA-C Electronically Signed   By: Richarda OverlieAdam  Henn M.D.   On: 08/26/2015 11:33    PHYSICAL EXAM General: NAD Neck: JVP 8-9 cm, no thyromegaly or thyroid nodule.  Lungs: Mild dependent  crackles. CV: Lateral PMI.  Heart regular S1/S2, no S3/S4, no murmur.  No peripheral edema. Abdomen: Soft, nontender, no hepatosplenomegaly.  Nondistended.  Neurologic: Alert and oriented x 3. No asterixis.  Psych: Normal affect. Extremities: No clubbing or cyanosis.   TELEMETRY: Reviewed telemetry pt in NSR with PVCs  ASSESSMENT AND PLAN: 75 yo with history of CAD s/p CABG, CKD stage III, chronic systolic CHF with prominent RV failure, cirrhosis, hypothyroidism presented with acute on chronic systolic CHF. 1. Acute on chronic systolic CHF: Echo this admission with EF 20% and moderately dilated/severely dysfunctional RV. He has prominent RV failure. Has Medtronic ICD.  He was admitted with severe orthopnea, has since felt better.  Paracentesis 8/28 with 3.2 L removed.  RHC yesterday with mildly elevated filling pressures and CI low at 1.98.  He diuresed well yesterday, weight down another 4 lbs and 10 lbs today.  Probably mild residual volume overload but considerably improved.  - Transition to torsemide 60 mg bid.  I am also going to continue the spironolactone 25 mg bid, hopefully will help with ascites.  His creatinine and K will need to be followed very carefully.  Will try to arrange for labs prior to his followup appt with me next week.  - With spironolactone use, will decrease KCl at home to 40 qam/20 qpm.  - Can continue Coreg at home dose 1.5625 mg bid. - He is not on ACEI/ARB given elevated creatinine.   - With low cardiac index, I am going to try him on a very low dose of digoxin, 0.0625 mg daily.  Will need to closely follow digoxin level.  - Difficult situation with heart, liver, and renal failure.  Not candidate for LVAD with significant renal and RV failure.  May need to think about palliative care in the near future. 2. AKI on CKD stage III: Baseline creatinine around 2.2.  Up to 2.7 on admission, now down to 2.06.  Possible that renal venous decongestion with diuresis/paracentesis  will help over time.   3. CAD: Stable, no chest pain.  Continue ASA 81 and statin.  4. Hypothyroidism: In setting of amiodarone use.  TSH quite high this admission.  Increased levothyroxine to 75 mcg daily.  5. Cirrhosis: ?Etiology, may be 2/2 to RV failure.  Never a heavy drinker, viral hepatitis serologies negative.  He already had cirrhosis on imaging prior to amiodarone initiation.   - NH3 high at admission but no confusion.  He is on lactulose, would continue at home. 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  Decreased amiodarone to 100 mg daily with cirrhosis (though think cirrhosis pre-dated amiodarone).  7. S/p MV repair: Stable echo this admission.  8. Disposition: I think he can go home today with very close followup.  He will  need BMET and digoxin level done Monday at CHF clinic and then followup with me there next week after that.  Cardiac meds for home: torsemide 60 mg po bid, KCl 40 qam/20 qpm, spironolactone 25 mg bid, digoxin 0.0625 mg daily, Coreg 1.5625 mg daily, amiodarone 100 mg daily, ASA 81, home statin dose, Levoxyl 75 mcg daily.   Marca Ancona 09/24/2015 8:56 AM

## 2015-09-25 ENCOUNTER — Other Ambulatory Visit: Payer: Medicare HMO

## 2015-09-25 LAB — CULTURE, BLOOD (ROUTINE X 2)
CULTURE: NO GROWTH
Culture: NO GROWTH

## 2015-09-25 MED FILL — Heparin Sodium (Porcine) 2 Unit/ML in Sodium Chloride 0.9%: INTRAMUSCULAR | Qty: 500 | Status: AC

## 2015-09-26 ENCOUNTER — Ambulatory Visit (HOSPITAL_COMMUNITY)
Admit: 2015-09-26 | Discharge: 2015-09-26 | Disposition: A | Discharge status: Home / Self Care | Payer: Medicare HMO | Source: Ambulatory Visit | Attending: Internal Medicine | Admitting: Internal Medicine

## 2015-09-26 DIAGNOSIS — I509 Heart failure, unspecified: Secondary | ICD-10-CM | POA: Diagnosis not present

## 2015-09-26 LAB — BASIC METABOLIC PANEL
ANION GAP: 7 (ref 5–15)
BUN: 39 mg/dL — ABNORMAL HIGH (ref 6–20)
CHLORIDE: 100 mmol/L — AB (ref 101–111)
CO2: 29 mmol/L (ref 22–32)
Calcium: 9 mg/dL (ref 8.9–10.3)
Creatinine, Ser: 2.45 mg/dL — ABNORMAL HIGH (ref 0.61–1.24)
GFR calc non Af Amer: 24 mL/min — ABNORMAL LOW (ref >60)
GFR, EST AFRICAN AMERICAN: 28 mL/min — AB (ref >60)
Glucose, Bld: 122 mg/dL — ABNORMAL HIGH (ref 65–99)
Potassium: 4.7 mmol/L (ref 3.5–5.1)
Sodium: 136 mmol/L (ref 135–145)

## 2015-09-30 ENCOUNTER — Ambulatory Visit (HOSPITAL_COMMUNITY)
Admission: RE | Admit: 2015-09-30 | Discharge: 2015-09-30 | Disposition: A | Discharge status: Home / Self Care | Payer: Medicare HMO | Source: Ambulatory Visit | Attending: Cardiology | Admitting: Cardiology

## 2015-09-30 ENCOUNTER — Encounter (HOSPITAL_COMMUNITY): Payer: Self-pay

## 2015-09-30 ENCOUNTER — Telehealth (HOSPITAL_COMMUNITY): Payer: Self-pay | Admitting: *Deleted

## 2015-09-30 VITALS — BP 102/60 | HR 59 | Wt 140.8 lb

## 2015-09-30 DIAGNOSIS — N184 Chronic kidney disease, stage 4 (severe): Secondary | ICD-10-CM

## 2015-09-30 DIAGNOSIS — I2583 Coronary atherosclerosis due to lipid rich plaque: Secondary | ICD-10-CM

## 2015-09-30 DIAGNOSIS — I2510 Atherosclerotic heart disease of native coronary artery without angina pectoris: Secondary | ICD-10-CM

## 2015-09-30 DIAGNOSIS — Z8249 Family history of ischemic heart disease and other diseases of the circulatory system: Secondary | ICD-10-CM | POA: Diagnosis not present

## 2015-09-30 DIAGNOSIS — Z7982 Long term (current) use of aspirin: Secondary | ICD-10-CM | POA: Diagnosis not present

## 2015-09-30 DIAGNOSIS — M109 Gout, unspecified: Secondary | ICD-10-CM | POA: Diagnosis not present

## 2015-09-30 DIAGNOSIS — E039 Hypothyroidism, unspecified: Secondary | ICD-10-CM | POA: Diagnosis not present

## 2015-09-30 DIAGNOSIS — I255 Ischemic cardiomyopathy: Secondary | ICD-10-CM | POA: Diagnosis not present

## 2015-09-30 DIAGNOSIS — Z87891 Personal history of nicotine dependence: Secondary | ICD-10-CM | POA: Insufficient documentation

## 2015-09-30 DIAGNOSIS — I5022 Chronic systolic (congestive) heart failure: Secondary | ICD-10-CM | POA: Diagnosis not present

## 2015-09-30 DIAGNOSIS — I5023 Acute on chronic systolic (congestive) heart failure: Secondary | ICD-10-CM | POA: Diagnosis present

## 2015-09-30 DIAGNOSIS — Z95810 Presence of automatic (implantable) cardiac defibrillator: Secondary | ICD-10-CM | POA: Diagnosis not present

## 2015-09-30 DIAGNOSIS — Z7951 Long term (current) use of inhaled steroids: Secondary | ICD-10-CM | POA: Diagnosis not present

## 2015-09-30 DIAGNOSIS — I493 Ventricular premature depolarization: Secondary | ICD-10-CM | POA: Diagnosis not present

## 2015-09-30 DIAGNOSIS — Z79899 Other long term (current) drug therapy: Secondary | ICD-10-CM | POA: Insufficient documentation

## 2015-09-30 DIAGNOSIS — N183 Chronic kidney disease, stage 3 (moderate): Secondary | ICD-10-CM | POA: Diagnosis not present

## 2015-09-30 DIAGNOSIS — Z951 Presence of aortocoronary bypass graft: Secondary | ICD-10-CM | POA: Diagnosis not present

## 2015-09-30 DIAGNOSIS — Z881 Allergy status to other antibiotic agents status: Secondary | ICD-10-CM | POA: Diagnosis not present

## 2015-09-30 DIAGNOSIS — K7460 Unspecified cirrhosis of liver: Secondary | ICD-10-CM | POA: Diagnosis not present

## 2015-09-30 LAB — BASIC METABOLIC PANEL
Anion gap: 11 (ref 5–15)
BUN: 54 mg/dL — AB (ref 6–20)
CHLORIDE: 100 mmol/L — AB (ref 101–111)
CO2: 23 mmol/L (ref 22–32)
Calcium: 9.3 mg/dL (ref 8.9–10.3)
Creatinine, Ser: 2.82 mg/dL — ABNORMAL HIGH (ref 0.61–1.24)
GFR calc Af Amer: 24 mL/min — ABNORMAL LOW (ref >60)
GFR calc non Af Amer: 21 mL/min — ABNORMAL LOW (ref >60)
GLUCOSE: 132 mg/dL — AB (ref 65–99)
POTASSIUM: 5.2 mmol/L — AB (ref 3.5–5.1)
Sodium: 134 mmol/L — ABNORMAL LOW (ref 135–145)

## 2015-09-30 LAB — DIGOXIN LEVEL: Digoxin Level: 0.9 ng/mL (ref 0.8–2.0)

## 2015-09-30 MED ORDER — TORSEMIDE 20 MG PO TABS: ORAL_TABLET | ORAL | 6 refills | Status: DC

## 2015-09-30 MED ORDER — ALPRAZOLAM 1 MG PO TABS
0.5000 mg | ORAL_TABLET | Freq: Every evening | ORAL | 0 refills | Status: DC | PRN
Start: 2015-09-30 — End: 2015-10-03

## 2015-09-30 MED ORDER — POTASSIUM CHLORIDE ER 10 MEQ PO TBCR: 40.0000 meq | EXTENDED_RELEASE_TABLET | Freq: Every day | ORAL | 6 refills | Status: DC

## 2015-09-30 MED ORDER — DIGOXIN 62.5 MCG PO TABS: 0.0625 mg | ORAL_TABLET | ORAL | 6 refills | Status: DC

## 2015-09-30 NOTE — Patient Instructions (Signed)
Decrease Potassium to daily.  Routine lab work today. Will notify you of abnormal results  Your provider has referred you to Cardiac Rehab they will call you to schedule appointment.  Follow up with Dr.McLean in 3 weeks.

## 2015-09-30 NOTE — Telephone Encounter (Signed)
Notes Recorded by Noralee Space, RN on 09/30/2015 at 4:53 PM EDT Patient aware. Agreeable and verbalizes understanding, repeat labs sch for 9/11

## 2015-09-30 NOTE — Telephone Encounter (Signed)
-----   Message from Laurey Morale, MD sent at 09/30/2015  4:06 PM EDT ----- Decrease digoxin to 0.0625 every other day.  Stop potassium.  Decrease torsemide to 60 qam/40 qpm.  Call today.

## 2015-09-30 NOTE — Addendum Note (Signed)
Encounter addended by: Laurey Morale, MD on: 09/30/2015  5:05 PM<BR>    Actions taken: LOS modified, Visit diagnoses modified
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
## 2015-10-03 ENCOUNTER — Encounter: Payer: Self-pay | Admitting: Family Medicine

## 2015-10-03 ENCOUNTER — Ambulatory Visit (INDEPENDENT_AMBULATORY_CARE_PROVIDER_SITE_OTHER): Payer: Medicare HMO | Admitting: Family Medicine

## 2015-10-03 VITALS — BP 115/65 | HR 60 | Ht 68.0 in | Wt 142.8 lb

## 2015-10-03 DIAGNOSIS — E038 Other specified hypothyroidism: Secondary | ICD-10-CM | POA: Diagnosis not present

## 2015-10-03 DIAGNOSIS — F411 Generalized anxiety disorder: Secondary | ICD-10-CM | POA: Diagnosis not present

## 2015-10-03 DIAGNOSIS — G479 Sleep disorder, unspecified: Secondary | ICD-10-CM

## 2015-10-03 DIAGNOSIS — Z87891 Personal history of nicotine dependence: Secondary | ICD-10-CM

## 2015-10-03 DIAGNOSIS — I5023 Acute on chronic systolic (congestive) heart failure: Secondary | ICD-10-CM | POA: Diagnosis not present

## 2015-10-03 DIAGNOSIS — R1310 Dysphagia, unspecified: Secondary | ICD-10-CM

## 2015-10-03 MED ORDER — ALPRAZOLAM 0.5 MG PO TABS: ORAL_TABLET | ORAL | 0 refills | Status: DC

## 2015-10-03 NOTE — Progress Notes (Signed)
Impression and Recommendations:    1. hypothyroidism   2. Sleep difficulties   3. Anxiety state   4. Acute on chronic systolic (congestive) heart failure (HCC)   5. Personal history of tobacco use   6. Dysphagia- diff swallowing solids and liquids    Hypothyroidism -Explained to patient and wife likely multifactorial reasons for his TSH increasing despite us giving him exogenous synthroid.  For instance hypothyroidism is more common in elderly- increasing incidence with increasing age. Also medications can have affect on thyroid function, ie amiodarone, but there are other potential drug-drug interactions- even multivitamins can have an effect on absorption rates of the thyroid meds.     -  I recommend he take his thyroid medicine  2 hours before any meal/drink and wait 4 hours after.    - I explained since his dose was recently increased in the hospital - we need to recheck levels after a constant dose has been taken for at least 6-7 weeks.   Asked to make appt for this labwrk visit.  Acute on chronic systolic (congestive) heart failure (HCC) Pt's heart failure and resultant sequela is getting much more tenuous, esp past few months. Pt understands importance of freq OV's and txmnt/ mgt by Cardiology at this point in his disease process  Dysphagia- diff swallowing solids and liquids Symptoms stable-patient has no complaints/ recent issues with eating/swallowing.  Sleep difficulties discussion with patient and wife regarding detrimental potential of benzos in elderly again and in someone with his medical conditions.  Patient and wife understand risk and my intent to d/c Xanax but they insist on him taking them- said he cannot sleep w/o it.   I will revisit the issue with patient in the future and try different medications for sleep. They declined sleep eval referral / OSA w/up today  Anxiety state Sx stable.  Pt denies issue currently    Patient's Medications  New  Prescriptions   No medications on file  Previous Medications   ALLOPURINOL (ZYLOPRIM) 100 MG TABLET    TAKE 1 TABLET BY MOUTH DAILY.   AMIODARONE (PACERONE) 200 MG TABLET    Take 0.5 tablets (100 mg total) by mouth daily.   ASPIRIN EC 81 MG TABLET    Take 81 mg by mouth at bedtime.    ATORVASTATIN (LIPITOR) 20 MG TABLET    Take 1 tablet (20 mg total) by mouth daily at 6 PM.   CARVEDILOL (COREG) 3.125 MG TABLET    Take 0.5 tablets (1.5625 mg total) by mouth 2 (two) times daily with a meal.   DIGOXIN 62.5 MCG TABS    Take 0.0625 mg by mouth every other day.   FLUTICASONE (FLONASE) 50 MCG/ACT NASAL SPRAY    Place 2 sprays into both nostrils daily.   LACTULOSE (CHRONULAC) 10 GM/15ML SOLUTION    Take 30 mLs (20 g total) by mouth 3 (three) times daily.   LEVOTHYROXINE (SYNTHROID, LEVOTHROID) 50 MCG TABLET    Take 1.5 tablets (75 mcg total) by mouth daily.   MELATONIN 3 MG CAPS    Take 3 mg by mouth at bedtime.   METOLAZONE (ZAROXOLYN) 2.5 MG TABLET    As directed by [facility]   NIACIN (NIASPAN) 500 MG CR TABLET    Take 1 tablet (500 mg total) by mouth at bedtime.   POLYVINYL ALCOHOL-POVIDONE (REFRESH) 1.4-0.6 % OPHTHALMIC SOLUTION    Place 1 drop into the left eye daily.    SPIRONOLACTONE (ALDACTONE) 25  MG TABLET    Take 1 tablet (25 mg total) by mouth 2 (two) times daily.   TORSEMIDE (DEMADEX) 20 MG TABLET    Take 3 tabs in AM and 2 tabs in PM  Modified Medications   Modified Medication Previous Medication   ALPRAZOLAM (XANAX) 0.5 MG TABLET ALPRAZolam (XANAX) 1 MG tablet      Take 1/2 to 1 tab nightly before bed.    Take 0.5 tablets (0.5 mg total) by mouth at bedtime as needed for anxiety. Take 1/2 to 1 tab nightly before bed.  Discontinued Medications   No medications on file    Return in about 6 weeks (around 11/14/2015) for tsh, t4 levels..  The patient was counseled, risk factors were discussed, anticipatory guidance given.  Gross side effects, risk and benefits, and  alternatives of medications discussed with patient.  Patient is aware that all medications have potential side effects and we are unable to predict every side effect or drug-drug interaction that may occur.  Expresses verbal understanding and consents to current therapy plan and treatment regimen.  Please see AVS handed out to patient at the end of our visit for further patient instructions/ counseling done pertaining to today's office visit.    Note: This document was prepared using Dragon voice recognition software and may include unintentional dictation errors.   --------------------------------------------------------------------------------------------------------------------------------------------------------------------------------------------------------------------------------------------    Subjective:    CC:  Chief Complaint  Patient presents with  . Follow-up    thyroid recheck    HPI: [name] Roberts is a 75 y.o. male who presents to [facility] today because per patient, he was told to follow-up with [place] for his thyroid abnormality.  Patient is accompanied by his wife today and appears in good spirits without dyspnea during conversations today.   Patient denies any significant fluctuations in weight and denies acute worsening symptoms of HF, energy levels- slightly improved from his norm due to his recent diuresis/ hospitalization  1)  Patient and wife state today that they are primarily here to discuss his thyroid function, and how it has become more abnormal during his hospital stay despite [place] starting levothyr prior to admission. ----> back in :  9/ 2016 -  TSH  5.521    5/ 2017 -  TSH 12.60         -Amio was started for the 1st time on 06/25/15 by Dr Susie Cassette.    08/08/15 -  TSH  32.988        - Pt transfer care to me in 6/17; I started Synthroid on        08/08/15      09/21/15 -  TSH  39.379       While in hosp- they inc dose to          on 09/24/15  2) ) Of note, he also had a recent hospitalization from [phone] for another acute on chronic heart failure episode.   Found to have severe RV Failure with resultant ascites requiring intensive diuresis and paracentesis.  Pt already in Stage 3a/b CRF and now with cardiogenic cirrhosis/ congestive hepatopathy that is worsening which has warrented lactulose 3 times a day.  Apparently pt had not displayed overt signs and symptoms of hepatic encephalopathy but recently NH3 was high and lactulose was initiated.   Also underwent cardiac catheterization by Dr. Shirlee Latch 8/29, this was after 3.2 L of fluid was removed via paracentesis on 8\28.   Other modifications  that were made by cardiology included starting him on low-dose digoxin, increasing his spironolactone and decreasing his amiodarone.  This is all managed by Cards now due to pt's tenuous condition. ---> Pt was seen most recently on 9\5\17 at the HF clinic by Dr. Shirlee Latch at a hosp f/up OV and was told to follow-up in one week for close monitoring of weights and labs; including any medication changes they feel may be needed.  I reminded pt of this today.     Wt Readings from Last 3 Encounters:  10/03/15 142 lb 12.8 oz (64.8 kg)  09/30/15 140 lb 12 oz (63.8 kg)  09/24/15 144 lb 3.2 oz (65.4 kg)   BP Readings from Last 3 Encounters:  10/03/15 115/65  09/30/15 102/60  09/24/15 (!) 89/53   Pulse Readings from Last 3 Encounters:  10/03/15 60  09/30/15 (!) 59  09/24/15 62     Patient Active Problem List   Diagnosis Date Noted  . Hypothyroidism 08/08/2015    Priority: High  . Dysphagia- diff swallowing solids and liquids 08/02/2015    Priority: High  . Hyperlipidemia 03/07/2008    Priority: High  . Automatic implantable cardioverter-defibrillator in situ (Dr Ladona Ridgel- [facility]) 03/07/2008    Priority: High  . Cirrhosis, cardiac 10/04/2015    Priority: Medium  . Severe left ventricular systolic dysfunction     Priority:  Medium  . 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 Gout of multiple sites 03/07/2008    Priority: Medium  . CAD  (Dr Reatha Harps) 03/07/2008    Priority: Medium  . Acute on chronic systolic (congestive) heart failure (HCC) 03/07/2008    Priority: Medium  . Anxiety state 09/20/2015  . Gout 09/20/2015  . Hyperkalemia 09/20/2015  . Loss of consciousness 09/11/2015  . Sleep difficulties 08/02/2015  . h/o AMI 08/02/2015  . Arthritis 07/12/2015  . HLD (hyperlipidemia) 07/12/2015  . h/o Iron deficiency anemia 07/12/2015  . Acute on chronic kidney failure (HCC) 07/08/2015  . h/o Syncope 06/20/2015  . Normocytic anemia 06/20/2015  . Peripheral edema 09/27/2014  . Ascites 09/27/2014  . Abdominal distention- due to CHF 09/27/2014  . H/o Herpes zoster 07/23/2014  . Counseling regarding end of life decision making 06/18/2014  . Elevated transaminase level 06/13/2012  . CARDIOMYOPATHY, ISCHEMIC 09/05/2008  . Personal history of tobacco use 04/26/1955    Past Medical history, Surgical history, Family history, Social history, Allergies and Medications have been entered into the medical record, reviewed and changed as needed.   Allergies:  Allergies  Allergen Reactions  . Erythromycin Base Swelling    Watery red eyes and red streaks down the face    Review of Systems: No fever/ chills, night sweats, no unintended weight loss/ wt gain, No chest pain, or increased shortness of breath, no increasing orthopnea or PND. No N/V/D.  Additional pertinent positives and negatives noted in HPI above    Objective:   Blood pressure 115/65, pulse 60, height 5\' 8"  (1.727 m), weight 142 lb 12.8 oz (64.8 kg). Body mass index is 21.71 kg/m. General: Well Developed, well nourished, appropriate for stated age.  Neuro: Alert and oriented x3, extra-ocular muscles intact, sensation grossly intact.  HEENT: Normocephalic, atraumatic, neck supple, no  bruits Skin: Warm and dry, no gross rash. Cardiac: RRR, S1 S2  Respiratory: ECTA B/L- (improved from previous times I examined him), Not using accessory muscles, speaking in full sentences-mildly labored- stable for pt.  Vascular:  2+ lower ext edema = b/l, cap RF less 2 sec. Psych: No HI/SI, judgement and insight good, Euthymic mood. Full Affect.

## 2015-10-03 NOTE — Patient Instructions (Addendum)
Humidifier at bedside / chairside.     Use neil med sinus rinses twice daily and only use flonase once daily or once everycouple days    ake the 75 g of your thyroid medicine. This must be daily and 2 hours after a meal/ pills/ multi-vitamins and 4 hours before.     Hypothyroidism Hypothyroidism is a disorder of the thyroid. The thyroid is a large gland that is located in the lower front of the neck. The thyroid releases hormones that control how the body works. With hypothyroidism, the thyroid does not make enough of these hormones. CAUSES Causes of hypothyroidism may include:  Viral infections.  Pregnancy.  Your own defense system (immune system) attacking your thyroid.  Certain medicines.  Birth defects.  Past radiation treatments to your head or neck.  Past treatment with radioactive iodine.  Past surgical removal of part or all of your thyroid.  Problems with the gland that is located in the center of your brain (pituitary). SIGNS AND SYMPTOMS Signs and symptoms of hypothyroidism may include:  Feeling as though you have no energy (lethargy).  Inability to tolerate cold.  Weight gain that is not explained by a change in diet or exercise habits.  Dry skin.  Coarse hair.  Menstrual irregularity.  Slowing of thought processes.  Constipation.  Sadness or depression. DIAGNOSIS  Your health care provider may diagnose hypothyroidism with blood tests and ultrasound tests. TREATMENT Hypothyroidism is treated with medicine that replaces the hormones that your body does not make. After you begin treatment, it may take several weeks for symptoms to go away. HOME CARE INSTRUCTIONS   Take medicines only as directed by your health care provider.  If you start taking any new medicines, tell your health care provider.  Keep all follow-up visits as directed by your health care provider. This is important. As your condition improves, your dosage needs may change. You  will need to have blood tests regularly so that your health care provider can watch your condition. SEEK MEDICAL CARE IF:  Your symptoms do not get better with treatment.  You are taking thyroid replacement medicine and:  You sweat excessively.  You have tremors.  You feel anxious.  You lose weight rapidly.  You cannot tolerate heat.  You have emotional swings.  You have diarrhea.  You feel weak. SEEK IMMEDIATE MEDICAL CARE IF:   You develop chest pain.  You develop an irregular heartbeat.  You develop a rapid heartbeat.   This information is not intended to replace advice given to you by your health care provider. Make sure you discuss any questions you have with your health care provider.   Document Released: 01/11/2005 Document Revised: 02/01/2014 Document Reviewed: 05/29/2013 Elsevier Interactive Patient Education [employer]

## 2015-10-04 DIAGNOSIS — K761 Chronic passive congestion of liver: Secondary | ICD-10-CM | POA: Insufficient documentation

## 2015-10-04 MED ORDER — ALPRAZOLAM 0.5 MG PO TABS: ORAL_TABLET | ORAL | 0 refills | Status: AC
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
## 2015-10-04 NOTE — Assessment & Plan Note (Addendum)
discussion with patient and wife regarding detrimental potential of benzos in elderly again and in someone with his medical conditions.  Patient and wife understand risk and my intent to d/c Xanax but they insist on him taking them- said he cannot sleep w/o it.   I will revisit the issue with patient in the future and try different medications for sleep. They declined sleep eval referral / OSA w/up today

## 2015-10-04 NOTE — Assessment & Plan Note (Signed)
Sx stable.  Pt denies issue currently

## 2015-10-04 NOTE — Assessment & Plan Note (Addendum)
-  Explained to patient and wife likely multifactorial reasons for his TSH increasing despite [place] giving him exogenous synthroid.  For instance hypothyroidism is more common in elderly- increasing incidence with increasing age. Also medications can have affect on thyroid function, ie amiodarone, but there are other potential drug-drug interactions- even multivitamins can have an effect on absorption rates of the thyroid meds.     -  I recommend he take his thyroid medicine  2 hours before any meal/drink and wait 4 hours after.    - I explained since his dose was recently increased in the hospital - we need to recheck levels after a constant dose has been taken for at least 6-7 weeks.   Asked to make appt for this labwrk visit.

## 2015-10-04 NOTE — Assessment & Plan Note (Signed)
Symptoms stable-patient has no complaints/ recent issues with eating/swallowing.

## 2015-10-04 NOTE — Assessment & Plan Note (Addendum)
Pt's heart failure and resultant sequela is getting much more tenuous, esp past few months. Pt understands importance of freq OV's and txmnt/ mgt by Cardiology at this point in his disease process

## 2015-10-04 NOTE — Assessment & Plan Note (Signed)
- 

## 2015-10-04 NOTE — Assessment & Plan Note (Addendum)
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  Now on llactulose and has had frequent paracenteses- every 2 wks or so.

## 2015-10-06 ENCOUNTER — Telehealth: Payer: Self-pay | Admitting: Cardiology

## 2015-10-06 ENCOUNTER — Ambulatory Visit (HOSPITAL_COMMUNITY)
Admission: RE | Admit: 2015-10-06 | Discharge: 2015-10-06 | Disposition: A | Discharge status: Home / Self Care | Payer: Medicare HMO | Source: Ambulatory Visit | Attending: Cardiology | Admitting: Cardiology

## 2015-10-06 ENCOUNTER — Ambulatory Visit (INDEPENDENT_AMBULATORY_CARE_PROVIDER_SITE_OTHER): Payer: Medicare HMO

## 2015-10-06 DIAGNOSIS — Z95810 Presence of automatic (implantable) cardiac defibrillator: Secondary | ICD-10-CM | POA: Diagnosis not present

## 2015-10-06 DIAGNOSIS — I5022 Chronic systolic (congestive) heart failure: Secondary | ICD-10-CM | POA: Diagnosis present

## 2015-10-06 LAB — BASIC METABOLIC PANEL
Anion gap: 11 (ref 5–15)
BUN: 61 mg/dL — AB (ref 6–20)
CHLORIDE: 97 mmol/L — AB (ref 101–111)
CO2: 28 mmol/L (ref 22–32)
Calcium: 9.4 mg/dL (ref 8.9–10.3)
Creatinine, Ser: 2.63 mg/dL — ABNORMAL HIGH (ref 0.61–1.24)
GFR calc Af Amer: 26 mL/min — ABNORMAL LOW (ref >60)
GFR calc non Af Amer: 22 mL/min — ABNORMAL LOW (ref >60)
GLUCOSE: 126 mg/dL — AB (ref 65–99)
POTASSIUM: 4.5 mmol/L (ref 3.5–5.1)
SODIUM: 136 mmol/L (ref 135–145)

## 2015-10-06 NOTE — Telephone Encounter (Signed)
Confirmed remote transmission w/ pt wife.   

## 2015-10-07 ENCOUNTER — Encounter: Payer: Self-pay | Admitting: Internal Medicine

## 2015-10-07 NOTE — Progress Notes (Signed)
EPIC Encounter for ICM Monitoring  Patient Name: [name] Roberts is a 75 y.o. male Date: 10/07/2015 Primary Care Physican: [name], DO Primary Cardiologist: Shirlee Latch Electrophysiologist: Ladona Ridgel Dry Weight: 137 lb (was 144 lb in hospital on 09/24/2015)       Spoke with wife due to patient unavailable (DPR on file).  She reported since hospital discharge he has been asymptomatic.  No more swelling of legs or neck.  The changes in the medication seems to be more effective for managing the fluid symptoms so far.  She stated this is the longest he has been without a paracentesis and abdomen does not seek to be bloated at this time.  He has had 7 paracentesis since early June including the one at last hospitalization.    Thoracic impedance above baseline suggesting dryness but patient was diuresed during hospitalization at end of August.         Recommendations: No changes.      Follow-up plan: ICM clinic phone appointment on 11/11/2015.  Office appointment with HF clinic 10/22/2015.  Copy of ICM check sent to primary cardiologist and device physician.   ICM trend: 10/06/2015       Karie Soda, RN 10/07/2015 10:29 AM

## 2015-10-14 ENCOUNTER — Other Ambulatory Visit: Payer: Medicare HMO

## 2015-10-14 ENCOUNTER — Encounter: Payer: Self-pay | Admitting: Internal Medicine

## 2015-10-15 ENCOUNTER — Encounter: Payer: Self-pay | Admitting: Internal Medicine

## 2015-10-22 ENCOUNTER — Ambulatory Visit (HOSPITAL_COMMUNITY)
Admission: RE | Admit: 2015-10-22 | Discharge: 2015-10-22 | Disposition: A | Discharge status: Home / Self Care | Payer: Medicare HMO | Source: Ambulatory Visit | Attending: Cardiology | Admitting: Cardiology

## 2015-10-22 ENCOUNTER — Encounter (HOSPITAL_COMMUNITY): Payer: Self-pay

## 2015-10-22 VITALS — BP 99/58 | HR 70 | Wt 145.5 lb

## 2015-10-22 DIAGNOSIS — I454 Nonspecific intraventricular block: Secondary | ICD-10-CM | POA: Insufficient documentation

## 2015-10-22 DIAGNOSIS — R188 Other ascites: Secondary | ICD-10-CM

## 2015-10-22 DIAGNOSIS — R1900 Intra-abdominal and pelvic swelling, mass and lump, unspecified site: Secondary | ICD-10-CM | POA: Insufficient documentation

## 2015-10-22 DIAGNOSIS — K7460 Unspecified cirrhosis of liver: Secondary | ICD-10-CM | POA: Diagnosis not present

## 2015-10-22 DIAGNOSIS — I5023 Acute on chronic systolic (congestive) heart failure: Secondary | ICD-10-CM | POA: Insufficient documentation

## 2015-10-22 DIAGNOSIS — E039 Hypothyroidism, unspecified: Secondary | ICD-10-CM | POA: Insufficient documentation

## 2015-10-22 DIAGNOSIS — M109 Gout, unspecified: Secondary | ICD-10-CM | POA: Insufficient documentation

## 2015-10-22 DIAGNOSIS — Z951 Presence of aortocoronary bypass graft: Secondary | ICD-10-CM | POA: Diagnosis not present

## 2015-10-22 DIAGNOSIS — I440 Atrioventricular block, first degree: Secondary | ICD-10-CM | POA: Diagnosis not present

## 2015-10-22 DIAGNOSIS — Z7982 Long term (current) use of aspirin: Secondary | ICD-10-CM | POA: Diagnosis not present

## 2015-10-22 DIAGNOSIS — I255 Ischemic cardiomyopathy: Secondary | ICD-10-CM | POA: Insufficient documentation

## 2015-10-22 DIAGNOSIS — I5022 Chronic systolic (congestive) heart failure: Secondary | ICD-10-CM

## 2015-10-22 DIAGNOSIS — Z79899 Other long term (current) drug therapy: Secondary | ICD-10-CM | POA: Diagnosis not present

## 2015-10-22 DIAGNOSIS — Z87891 Personal history of nicotine dependence: Secondary | ICD-10-CM | POA: Insufficient documentation

## 2015-10-22 DIAGNOSIS — N184 Chronic kidney disease, stage 4 (severe): Secondary | ICD-10-CM | POA: Diagnosis not present

## 2015-10-22 DIAGNOSIS — I2510 Atherosclerotic heart disease of native coronary artery without angina pectoris: Secondary | ICD-10-CM | POA: Diagnosis not present

## 2015-10-22 DIAGNOSIS — I472 Ventricular tachycardia: Secondary | ICD-10-CM | POA: Diagnosis not present

## 2015-10-22 LAB — COMPREHENSIVE METABOLIC PANEL
ALBUMIN: 3.8 g/dL (ref 3.5–5.0)
ALK PHOS: 146 U/L — AB (ref 38–126)
ALT: 42 U/L (ref 17–63)
ANION GAP: 11 (ref 5–15)
AST: 48 U/L — AB (ref 15–41)
BILIRUBIN TOTAL: 1.9 mg/dL — AB (ref 0.3–1.2)
BUN: 72 mg/dL — ABNORMAL HIGH (ref 6–20)
CO2: 23 mmol/L (ref 22–32)
Calcium: 9.4 mg/dL (ref 8.9–10.3)
Chloride: 96 mmol/L — ABNORMAL LOW (ref 101–111)
Creatinine, Ser: 2.64 mg/dL — ABNORMAL HIGH (ref 0.61–1.24)
GFR calc non Af Amer: 22 mL/min — ABNORMAL LOW (ref >60)
GFR, EST AFRICAN AMERICAN: 26 mL/min — AB (ref >60)
GLUCOSE: 122 mg/dL — AB (ref 65–99)
POTASSIUM: 4.5 mmol/L (ref 3.5–5.1)
Sodium: 130 mmol/L — ABNORMAL LOW (ref 135–145)
TOTAL PROTEIN: 7.6 g/dL (ref 6.5–8.1)

## 2015-10-22 LAB — TSH: TSH: 24.301 u[IU]/mL — ABNORMAL HIGH (ref 0.350–4.500)

## 2015-10-22 LAB — DIGOXIN LEVEL: DIGOXIN LVL: 0.9 ng/mL (ref 0.8–2.0)

## 2015-10-22 MED ORDER — TORSEMIDE 20 MG PO TABS: 60.0000 mg | ORAL_TABLET | Freq: Two times a day (BID) | ORAL | 6 refills | Status: DC
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
## 2015-10-22 NOTE — Patient Instructions (Addendum)
Increase Torsemide to 60 mg (3 tabs) Twice daily   Labs today  Labs in 10 days  Paracentesis Fri 10/31/15 at 9:45 at [facility]  Your physician recommends that you schedule a follow-up appointment in: 2 weeks

## 2015-10-23 ENCOUNTER — Telehealth (HOSPITAL_COMMUNITY): Payer: Self-pay | Admitting: Cardiology

## 2015-10-23 NOTE — Telephone Encounter (Signed)
opened in error

## 2015-10-24 ENCOUNTER — Other Ambulatory Visit: Payer: Self-pay

## 2015-10-28 ENCOUNTER — Other Ambulatory Visit (HOSPITAL_COMMUNITY): Payer: Self-pay | Admitting: Cardiology

## 2015-10-28 ENCOUNTER — Encounter: Payer: Self-pay | Admitting: Cardiology

## 2015-10-29 ENCOUNTER — Encounter (HOSPITAL_COMMUNITY): Payer: Self-pay | Admitting: *Deleted

## 2015-10-29 ENCOUNTER — Telehealth (HOSPITAL_COMMUNITY): Payer: Self-pay | Admitting: *Deleted

## 2015-10-29 NOTE — Telephone Encounter (Signed)
Letter mailed for patient to call for lab results.

## 2015-10-31 ENCOUNTER — Ambulatory Visit (HOSPITAL_COMMUNITY)
Admission: RE | Admit: 2015-10-31 | Discharge: 2015-10-31 | Disposition: A | Discharge status: Home / Self Care | Payer: Medicare HMO | Source: Ambulatory Visit | Attending: Cardiology | Admitting: Cardiology

## 2015-10-31 ENCOUNTER — Other Ambulatory Visit (HOSPITAL_COMMUNITY): Payer: Self-pay | Admitting: Cardiology

## 2015-10-31 DIAGNOSIS — R188 Other ascites: Secondary | ICD-10-CM | POA: Insufficient documentation

## 2015-10-31 NOTE — Progress Notes (Signed)
Patient ID: [name] Roberts, male   DOB: [birth year], 75 y.o.   MRN: [id number] Patient presented to ultrasound dept. today for therapeutic paracentesis. On limited ultrasound of abdomen in all 4 quadrants there is only a small amount of ascites present, primarily in the perihepatic region which is not safely accessible at this time. Procedure was canceled. Patient notified.

## 2015-11-05 ENCOUNTER — Ambulatory Visit (HOSPITAL_COMMUNITY)
Admission: RE | Admit: 2015-11-05 | Discharge: 2015-11-05 | Disposition: A | Discharge status: Home / Self Care | Payer: Medicare HMO | Source: Ambulatory Visit | Attending: Cardiology | Admitting: Cardiology

## 2015-11-05 ENCOUNTER — Ambulatory Visit (INDEPENDENT_AMBULATORY_CARE_PROVIDER_SITE_OTHER): Payer: Medicare HMO

## 2015-11-05 VITALS — BP 102/64 | HR 67 | Wt 147.0 lb

## 2015-11-05 VITALS — BP 95/58 | HR 68 | Temp 97.5°F

## 2015-11-05 DIAGNOSIS — N184 Chronic kidney disease, stage 4 (severe): Secondary | ICD-10-CM | POA: Diagnosis not present

## 2015-11-05 DIAGNOSIS — E039 Hypothyroidism, unspecified: Secondary | ICD-10-CM | POA: Diagnosis not present

## 2015-11-05 DIAGNOSIS — I2510 Atherosclerotic heart disease of native coronary artery without angina pectoris: Secondary | ICD-10-CM | POA: Insufficient documentation

## 2015-11-05 DIAGNOSIS — Z7982 Long term (current) use of aspirin: Secondary | ICD-10-CM | POA: Diagnosis not present

## 2015-11-05 DIAGNOSIS — I255 Ischemic cardiomyopathy: Secondary | ICD-10-CM | POA: Insufficient documentation

## 2015-11-05 DIAGNOSIS — Z951 Presence of aortocoronary bypass graft: Secondary | ICD-10-CM | POA: Insufficient documentation

## 2015-11-05 DIAGNOSIS — Z79899 Other long term (current) drug therapy: Secondary | ICD-10-CM | POA: Diagnosis not present

## 2015-11-05 DIAGNOSIS — I472 Ventricular tachycardia: Secondary | ICD-10-CM | POA: Insufficient documentation

## 2015-11-05 DIAGNOSIS — Z23 Encounter for immunization: Secondary | ICD-10-CM

## 2015-11-05 DIAGNOSIS — M109 Gout, unspecified: Secondary | ICD-10-CM | POA: Insufficient documentation

## 2015-11-05 DIAGNOSIS — N183 Chronic kidney disease, stage 3 unspecified: Secondary | ICD-10-CM

## 2015-11-05 DIAGNOSIS — Z87891 Personal history of nicotine dependence: Secondary | ICD-10-CM | POA: Diagnosis not present

## 2015-11-05 DIAGNOSIS — Z95810 Presence of automatic (implantable) cardiac defibrillator: Secondary | ICD-10-CM | POA: Insufficient documentation

## 2015-11-05 DIAGNOSIS — I5022 Chronic systolic (congestive) heart failure: Secondary | ICD-10-CM

## 2015-11-05 DIAGNOSIS — K761 Chronic passive congestion of liver: Secondary | ICD-10-CM | POA: Diagnosis not present

## 2015-11-05 DIAGNOSIS — I493 Ventricular premature depolarization: Secondary | ICD-10-CM | POA: Insufficient documentation

## 2015-11-05 DIAGNOSIS — K7460 Unspecified cirrhosis of liver: Secondary | ICD-10-CM | POA: Diagnosis not present

## 2015-11-05 NOTE — Patient Instructions (Signed)
Follow up 3 weeks with Dr. Shirlee Latch.  Do the following things EVERYDAY: 1) Weigh yourself in the morning before breakfast. Write it down and keep it in a log. 2) Take your medicines as prescribed 3) Eat low salt foods-Limit salt (sodium) to 2000 mg per day.  4) Stay as active as you can everyday 5) Limit all fluids for the day to less than 2 liters
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
## 2015-11-11 ENCOUNTER — Ambulatory Visit (INDEPENDENT_AMBULATORY_CARE_PROVIDER_SITE_OTHER): Payer: Medicare HMO | Admitting: *Deleted

## 2015-11-11 DIAGNOSIS — I5022 Chronic systolic (congestive) heart failure: Secondary | ICD-10-CM | POA: Diagnosis not present

## 2015-11-11 DIAGNOSIS — I255 Ischemic cardiomyopathy: Secondary | ICD-10-CM

## 2015-11-11 DIAGNOSIS — Z95810 Presence of automatic (implantable) cardiac defibrillator: Secondary | ICD-10-CM | POA: Diagnosis not present

## 2015-11-11 NOTE — Progress Notes (Signed)
EPIC Encounter for ICM Monitoring  Patient Name: [name] Roberts is a 75 y.o. male Date: 11/11/2015 Primary Care Physican: [name], DO Primary Cardiologist:[place]Lean Electrophysiologist: Ladona Ridgel Dry Weight:    140 lb      Spoke with wife, (DPR).  Heart Failure questions reviewed, pt asymptomatic   Thoracic impedance normal   Most Recent Labs: 10/22/2015 Creatinine 2.64, BUN 72, Potassium 4.5, Sodium 130       10/06/2015 Creatinine 2.63, BUN 61, Potassium 4.5, Sodium 136 09/30/2015 Creatinine 2.82, BUN 54, Potassium 5.2, Sodium 134 09/26/2015 Creatinine 2.45, BUN 39, Potassium 4.7, Sodium 136 09/24/2015 Creatinine 2.06, BUN 37, Potassium 4.4, Sodium 138  Recommendations: No changes.  Advised to limit salt intake to 2000 mg daily.  Encouraged to call for fluid symptoms.    Follow-up plan: ICM clinic phone appointment on 12/12/2015.  Copy of ICM check sent to device physician.   ICM trend: 11/11/2015       Karie Soda, RN 11/11/2015 11:57 AM

## 2015-11-11 NOTE — Progress Notes (Signed)
Remote ICD transmission.   

## 2015-11-12 ENCOUNTER — Encounter: Payer: Self-pay | Admitting: Cardiology

## 2015-11-13 ENCOUNTER — Telehealth (HOSPITAL_COMMUNITY): Payer: Self-pay

## 2015-11-13 NOTE — Telephone Encounter (Signed)
Wife called CHF clinic triage to report patient feeling very weak and fatigued generally past few days. Unsure if this is related to recent flu shot or other CHF related issues. Weight stable, recent device interrogation showed good impedence with minimal fluid, patient was scheduled for US paracentesis on 10/9 but was unable to proceed as patient had no fluid to draw. Per Otilio Saber PA-C, will have patient come to clinic tomorrow for bmet and bnp and vitals. Advised to return call to clinic if he becomes more symptomatic.  Ave Filter, RN

## 2015-11-14 ENCOUNTER — Telehealth (HOSPITAL_COMMUNITY): Payer: Self-pay | Admitting: Cardiology

## 2015-11-14 ENCOUNTER — Ambulatory Visit (HOSPITAL_COMMUNITY)
Admission: RE | Admit: 2015-11-14 | Discharge: 2015-11-14 | Disposition: A | Discharge status: Home / Self Care | Payer: Medicare HMO | Source: Ambulatory Visit | Attending: Internal Medicine | Admitting: Internal Medicine

## 2015-11-14 DIAGNOSIS — I519 Heart disease, unspecified: Secondary | ICD-10-CM

## 2015-11-14 DIAGNOSIS — I110 Hypertensive heart disease with heart failure: Secondary | ICD-10-CM

## 2015-11-14 LAB — BASIC METABOLIC PANEL
Anion gap: 13 (ref 5–15)
BUN: 91 mg/dL — AB (ref 6–20)
CALCIUM: 9.4 mg/dL (ref 8.9–10.3)
CO2: 22 mmol/L (ref 22–32)
Chloride: 92 mmol/L — ABNORMAL LOW (ref 101–111)
Creatinine, Ser: 4.3 mg/dL — ABNORMAL HIGH (ref 0.61–1.24)
GFR calc Af Amer: 14 mL/min — ABNORMAL LOW (ref >60)
GFR, EST NON AFRICAN AMERICAN: 12 mL/min — AB (ref >60)
GLUCOSE: 109 mg/dL — AB (ref 65–99)
POTASSIUM: 4.6 mmol/L (ref 3.5–5.1)
Sodium: 127 mmol/L — ABNORMAL LOW (ref 135–145)

## 2015-11-14 LAB — BRAIN NATRIURETIC PEPTIDE: B Natriuretic Peptide: 3359 pg/mL — ABNORMAL HIGH (ref 0.0–100.0)

## 2015-11-14 NOTE — Telephone Encounter (Signed)
Patient aware. Via wife. Follow up 10/23 with [facility]

## 2015-11-14 NOTE — Telephone Encounter (Signed)
-----   Message from Graciella Freer, PA-C sent at 11/14/2015 12:51 PM EDT ----- Discussed with Dr. Shirlee Latch   Needs to HOLD metolazone, Torsemide, Digoxin AND Spironolactone.   Needs to be seen on Monday with whoever is available with stat repeat BMET, BNP, and DIG level.   Please tell him to go to ED over weekend if he feels worst.    [name] "Otilio Saber, PA-C 11/14/2015 12:51 PM

## 2015-11-17 ENCOUNTER — Ambulatory Visit (HOSPITAL_COMMUNITY)
Admission: RE | Admit: 2015-11-17 | Discharge: 2015-11-17 | Disposition: A | Discharge status: Home / Self Care | Payer: Medicare HMO | Source: Ambulatory Visit | Attending: Cardiology | Admitting: Cardiology

## 2015-11-17 VITALS — BP 106/68 | HR 64 | Wt 151.4 lb

## 2015-11-17 DIAGNOSIS — E039 Hypothyroidism, unspecified: Secondary | ICD-10-CM | POA: Diagnosis not present

## 2015-11-17 DIAGNOSIS — Z79899 Other long term (current) drug therapy: Secondary | ICD-10-CM | POA: Diagnosis not present

## 2015-11-17 DIAGNOSIS — Z8249 Family history of ischemic heart disease and other diseases of the circulatory system: Secondary | ICD-10-CM | POA: Insufficient documentation

## 2015-11-17 DIAGNOSIS — Z7982 Long term (current) use of aspirin: Secondary | ICD-10-CM | POA: Diagnosis not present

## 2015-11-17 DIAGNOSIS — Z881 Allergy status to other antibiotic agents status: Secondary | ICD-10-CM | POA: Diagnosis not present

## 2015-11-17 DIAGNOSIS — Z95810 Presence of automatic (implantable) cardiac defibrillator: Secondary | ICD-10-CM | POA: Insufficient documentation

## 2015-11-17 DIAGNOSIS — N179 Acute kidney failure, unspecified: Secondary | ICD-10-CM | POA: Insufficient documentation

## 2015-11-17 DIAGNOSIS — Z7951 Long term (current) use of inhaled steroids: Secondary | ICD-10-CM | POA: Insufficient documentation

## 2015-11-17 DIAGNOSIS — Z951 Presence of aortocoronary bypass graft: Secondary | ICD-10-CM | POA: Diagnosis not present

## 2015-11-17 DIAGNOSIS — N183 Chronic kidney disease, stage 3 unspecified: Secondary | ICD-10-CM

## 2015-11-17 DIAGNOSIS — I2510 Atherosclerotic heart disease of native coronary artery without angina pectoris: Secondary | ICD-10-CM | POA: Diagnosis not present

## 2015-11-17 DIAGNOSIS — M109 Gout, unspecified: Secondary | ICD-10-CM | POA: Insufficient documentation

## 2015-11-17 DIAGNOSIS — I519 Heart disease, unspecified: Secondary | ICD-10-CM | POA: Diagnosis not present

## 2015-11-17 DIAGNOSIS — I255 Ischemic cardiomyopathy: Secondary | ICD-10-CM | POA: Diagnosis not present

## 2015-11-17 DIAGNOSIS — I5022 Chronic systolic (congestive) heart failure: Secondary | ICD-10-CM | POA: Diagnosis not present

## 2015-11-17 DIAGNOSIS — R188 Other ascites: Secondary | ICD-10-CM | POA: Diagnosis not present

## 2015-11-17 DIAGNOSIS — Z87891 Personal history of nicotine dependence: Secondary | ICD-10-CM | POA: Diagnosis not present

## 2015-11-17 DIAGNOSIS — K7460 Unspecified cirrhosis of liver: Secondary | ICD-10-CM | POA: Insufficient documentation

## 2015-11-17 LAB — COMPREHENSIVE METABOLIC PANEL
ALT: 29 U/L (ref 17–63)
AST: 41 U/L (ref 15–41)
Albumin: 3.6 g/dL (ref 3.5–5.0)
Alkaline Phosphatase: 119 U/L (ref 38–126)
Anion gap: 12 (ref 5–15)
BUN: 87 mg/dL — ABNORMAL HIGH (ref 6–20)
CHLORIDE: 97 mmol/L — AB (ref 101–111)
CO2: 22 mmol/L (ref 22–32)
CREATININE: 3.51 mg/dL — AB (ref 0.61–1.24)
Calcium: 9.1 mg/dL (ref 8.9–10.3)
GFR, EST AFRICAN AMERICAN: 18 mL/min — AB (ref >60)
GFR, EST NON AFRICAN AMERICAN: 16 mL/min — AB (ref >60)
Glucose, Bld: 156 mg/dL — ABNORMAL HIGH (ref 65–99)
POTASSIUM: 3.8 mmol/L (ref 3.5–5.1)
SODIUM: 131 mmol/L — AB (ref 135–145)
Total Bilirubin: 1.8 mg/dL — ABNORMAL HIGH (ref 0.3–1.2)
Total Protein: 6.8 g/dL (ref 6.5–8.1)

## 2015-11-17 LAB — BRAIN NATRIURETIC PEPTIDE: B NATRIURETIC PEPTIDE 5: 4163 pg/mL — AB (ref 0.0–100.0)

## 2015-11-17 MED ORDER — TORSEMIDE 20 MG PO TABS: ORAL_TABLET | ORAL | 3 refills | Status: DC

## 2015-11-17 NOTE — Patient Instructions (Signed)
Start Torsemide 60 mg (3 tabs) in AM and 40 mg (2 tabs) in PM  Labs today  Paracentesis tomorrow (10/24) at [facility], please arrive to radiology at 9:45 am  Your physician recommends that you schedule a follow-up appointment in: 1 week
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
## 2015-11-18 ENCOUNTER — Encounter: Payer: Self-pay | Admitting: Cardiovascular Disease

## 2015-11-18 ENCOUNTER — Other Ambulatory Visit (HOSPITAL_COMMUNITY): Payer: Self-pay | Admitting: Cardiology

## 2015-11-18 ENCOUNTER — Ambulatory Visit (HOSPITAL_COMMUNITY)
Admission: RE | Admit: 2015-11-18 | Discharge: 2015-11-18 | Disposition: A | Discharge status: Home / Self Care | Payer: Medicare HMO | Source: Ambulatory Visit | Attending: Cardiology | Admitting: Cardiology

## 2015-11-18 DIAGNOSIS — R188 Other ascites: Secondary | ICD-10-CM | POA: Insufficient documentation

## 2015-11-18 MED ORDER — LIDOCAINE HCL 1 % IJ SOLN
INTRAMUSCULAR | Status: AC
  Filled 2015-11-18: qty 20

## 2015-11-19 ENCOUNTER — Ambulatory Visit (INDEPENDENT_AMBULATORY_CARE_PROVIDER_SITE_OTHER): Payer: Medicare HMO | Admitting: Family Medicine

## 2015-11-19 ENCOUNTER — Encounter: Payer: Self-pay | Admitting: Internal Medicine

## 2015-11-19 ENCOUNTER — Encounter: Payer: Self-pay | Admitting: Family Medicine

## 2015-11-19 VITALS — BP 95/59 | HR 99 | Resp 18 | Wt 144.0 lb

## 2015-11-19 DIAGNOSIS — L02519 Cutaneous abscess of unspecified hand: Secondary | ICD-10-CM | POA: Diagnosis not present

## 2015-11-19 DIAGNOSIS — Z0189 Encounter for other specified special examinations: Secondary | ICD-10-CM

## 2015-11-19 DIAGNOSIS — Z7689 Persons encountering health services in other specified circumstances: Secondary | ICD-10-CM

## 2015-11-19 DIAGNOSIS — Z79899 Other long term (current) drug therapy: Secondary | ICD-10-CM | POA: Diagnosis not present

## 2015-11-19 DIAGNOSIS — L03011 Cellulitis of right finger: Secondary | ICD-10-CM

## 2015-11-19 MED ORDER — SULFAMETHOXAZOLE-TRIMETHOPRIM 800-160 MG PO TABS: 1.0000 | ORAL_TABLET | Freq: Two times a day (BID) | ORAL | 0 refills | Status: DC

## 2015-11-19 NOTE — Progress Notes (Signed)
Impression and Recommendations:    1. Abscess of middle finger- R   2. Felon of finger of right hand   3. Encounter for incision and drainage procedure   4. High risk medication use    I & D, antibiotics as below after discussion of risks\ benefits.  Procedure:  Incision and drainage of pustule Risks, benefits, and alternatives explained to patient and consent obtained. Time out conducted. Surface cleaned with alcohol * 3 Area prepped in a sterile fashion. #11 blade used to make a stab incision into abscess/pustule. Pus expressed with mild pressure, culture obtained and sent for I&D/S Hemostasis present, sterile dressing placed Pt tolerated procedure well; stable.  Wound and aftercare discussed with patient follow-up advised; all questions answered.   I did not modify any of the patient's medications today. Torsemide dose updated per patient by CMA New Prescriptions   SULFAMETHOXAZOLE-TRIMETHOPRIM (BACTRIM DS,SEPTRA DS) 800-160 MG TABLET    Take 1 tablet by mouth 2 (two) times daily.    Modified Medications   Modified Medication Previous Medication   TORSEMIDE (DEMADEX) 20 MG TABLET torsemide (DEMADEX) 20 MG tablet      Take 4 tabs in AM and 3 tabs in PM    Take 3 tabs in AM and 2 tabs in PM    Discontinued Medications   No medications on file    The patient was counseled, risk factors were discussed, anticipatory guidance given.  Gross side effects, risk and benefits, and alternatives of medications and treatment plan in general discussed with patient.  Patient is aware that all medications have potential side effects and we are unable to predict every side effect or drug-drug interaction that may occur.   Patient will call with any questions prior to using medication if they have concerns.  Expresses verbal understanding and consents to current therapy and treatment regimen.  No barriers to understanding were identified.  Red flag symptoms and signs discussed in  detail.  Patient expressed understanding regarding what to do in case of emergency\urgent symptoms  Return 10d or so, and sooner if symptoms worsen or fail to slowly improve.  Please see AVS handed out to patient at the end of our visit for further patient instructions/ counseling done pertaining to today's office visit.    Note: This document was prepared using Dragon voice recognition software and may include unintentional dictation errors.   --------------------------------------------------------------------------------------------------------------------------------------------------------------------------------------------------------------------------------------------    Subjective:    CC:  Chief Complaint  Patient presents with  . Skin Problem    Area on 3 rd finger right hand x 1 day.     HPI: [name] Roberts is a 75 y.o. male who [facility] for issues as discussed below.   Patient was trimming his nails a couple of days ago.  Yesterday he noticed a small "pimple" medial aspect right middle finger, near end of the nail fold.   Today he awoke and it appeared worse and had expanded. Now slightly swollen.  Full range of motion finger without pain.   No fever chills, nausea vomiting, or expanding rash or other pustules\abscesses.   Wt Readings from Last 3 Encounters:  11/22/15 150 lb (68 kg)  11/19/15 144 lb (65.3 kg)  11/17/15 151 lb 6.4 oz (68.7 kg)   BP Readings from Last 3 Encounters:  11/22/15 (!) 98/59  11/19/15 (!) 95/59  11/17/15 106/68   Pulse Readings from Last 3 Encounters:  11/22/15 73  11/19/15 99  11/17/15 64   BMI Readings from Last 3 Encounters:  11/22/15 26.57 kg/m  11/19/15 21.90 kg/m  11/17/15 23.02 kg/m     Patient Care Team    Relationship Specialty Notifications Start End  [name], DO PCP - General Family Medicine  07/11/15   Laurey Morale, MD Consulting Physician Cardiology   10/04/15    Comment: CHF clinic  Huston Foley, MD Attending Physician Neurology  10/04/15   Marinus Maw, MD Consulting Physician Cardiology  10/04/15    Comment: Automatic implantable cardioverter-defibrillator in situ (Dr Ladona Ridgel- EP-Cards)  Kathleene Hazel, MD Consulting Physician Cardiology  10/04/15    Comment: Gen Cards Doc for pt    Patient Active Problem List   Diagnosis Date Noted  . Hypothyroidism 08/08/2015    Priority: High  . Dysphagia- diff swallowing solids and liquids 08/02/2015    Priority: High  . Hyperlipidemia 03/07/2008    Priority: High  . Automatic implantable cardioverter-defibrillator in situ (Dr Ladona Ridgel- EP-Cards) 03/07/2008    Priority: High  . Cirrhosis, cardiac 10/04/2015    Priority: Medium  . Severe left ventricular systolic dysfunction     Priority: Medium  . 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 Gout of multiple sites 03/07/2008    Priority: Medium  . CAD  (Dr Reatha Harps) 03/07/2008    Priority: Medium  . Acute on chronic systolic (congestive) heart failure 03/07/2008    Priority: Medium  . Low output heart failure (HCC) 11/22/2015  . Cardiogenic shock (HCC)   . AKI (acute kidney injury) (HCC)   . Anxiety state 09/20/2015  . Gout 09/20/2015  . Hyperkalemia 09/20/2015  . Loss of consciousness (HCC) 09/11/2015  . Sleep difficulties 08/02/2015  . h/o AMI 08/02/2015  . Arthritis 07/12/2015  . HLD (hyperlipidemia) 07/12/2015  . h/o Iron deficiency anemia 07/12/2015  . Acute on chronic kidney failure (HCC) 07/08/2015  . h/o Syncope 06/20/2015  . Normocytic anemia 06/20/2015  . Peripheral edema 09/27/2014  . Ascites 09/27/2014  . Abdominal distention- due to CHF 09/27/2014  . H/o Herpes zoster 07/23/2014  . Counseling regarding end of life decision making 06/18/2014  . Elevated transaminase level 06/13/2012  . CARDIOMYOPATHY, ISCHEMIC 09/05/2008  . Personal history of tobacco use  04/26/1955    Past Medical history, Surgical history, Family history, Social history, Allergies and Medications have been entered into the medical record, reviewed and changed as needed.   Allergies:  Allergies  Allergen Reactions  . Erythromycin Base Swelling and Other (See Comments)    Watery red eyes and red streaks down the face    Review of Systems  Constitutional: Negative for chills, diaphoresis and fever.  Gastrointestinal: Negative for diarrhea, nausea and vomiting.  Musculoskeletal: Negative for joint pain and myalgias.  Skin: Positive for rash.     Objective:   Blood pressure (!) 95/59, pulse 99, resp. rate 18, weight 144 lb (65.3 kg), SpO2 99 %. Body mass index is 21.9 kg/m. General: Well Developed, well nourished, appropriate for stated age, pleasant, spunky.  Neuro: Alert and oriented x3, extra-ocular muscles intact, sensation grossly intact.  HEENT: Normocephalic, atraumatic, neck supple   Skin: Warm and dry, + 71mm-5mm white pus filled abcess--> Medial aspect R middle finger near end of nail fold, cellulitis of tissue immediately surrounding.  Cap RF less than 2 sec Respiratory: speaking in full sentences-unlabored. Psych: No HI/SI, judgement and insight good, Euthymic mood. Full Affect.

## 2015-11-19 NOTE — Patient Instructions (Addendum)
Felon   Fingertip Infections There are basically two types of fingertip infections: infections of the pulp (the soft, fatty part where your fingerprint is), called a felon; and infections of the area around the fingernail, called a paronychia. The unique anatomy of the fingertip determines the details of how these are treated. Felon A felon is an infection of the fingertip pulp that come in two forms: cellulitis, which is a non-pus forming infection, and an abscess (a cavity of pus). They usually start out as only slightly tender but can rapidly progress to extremely painful. These fingertip infections usually come from some type of penetrating injury, such as a small laceration (cut) or a splinter, but sometimes we cannot find any specific reason. The treatment depends on the form or stage of the infection. If the problem is only a cellulitis, hot soaks and oral (pills by mouth) antibiotics will do the trick, but close observation (office visits every other day or so) is needed. If the felon has progressed to the point of forming an abscess, surgical drainage is needed. The diagnosis of which stage the felon is in can be very difficult, because they both are red, swollen, and very painful, and the treatment is completely different for each stage. It is important to see a skilled hand surgery specialist. The prognosis is excellent if the infection is treated properly. Hot Soaks If the felon is still in the cellulitis stage, hot soaks and oral antibiotics are indicated. Hot soaks consist of putting the hand into very warm water: not hot enough to hurt, but hot enough to make the hand very red. The red means that there is a lot of increase blood flow to the hand, bringing the body's natural infection-fighting cells to the site as well as lots of antibiotics. The soaks should be about 20 minutes long, and often the hot water needs replacing. Additives such as epson salts are not needed, but some  patients like to add it anyway. The hot soaks should be repeated five times a day.    No nail clippers- nail file only.       Sulfamethoxazole; Trimethoprim, SMX-TMP tablets What is this medicine? SULFAMETHOXAZOLE; TRIMETHOPRIM or SMX-TMP (suhl fuh meth OK suh zohl; trye METH oh prim) is a combination of a sulfonamide antibiotic and a second antibiotic, trimethoprim. It is used to treat or prevent certain kinds of bacterial infections. It will not work for colds, flu, or other viral infections. This medicine may be used for other purposes; ask your health care provider or pharmacist if you have questions. What should I tell my health care provider before I take this medicine? They need to know if you have any of these conditions: -anemia -asthma -being treated with anticonvulsants -if you frequently drink alcohol containing drinks -kidney disease -liver disease -low level of folic acid or [id number] dehydrogenase -poor nutrition or malabsorption -porphyria -severe allergies -thyroid disorder -an unusual or allergic reaction to sulfamethoxazole, trimethoprim, sulfa drugs, other medicines, foods, dyes, or preservatives -pregnant or trying to get pregnant -breast-feeding How should I use this medicine? Take this medicine by mouth with a full glass of water. Follow the directions on the prescription label. Take your medicine at regular intervals. Do not take it more often than directed. Do not skip doses or stop your medicine early. Talk to your pediatrician regarding the use of this medicine in children. Special care may be needed. This medicine has been used in children as young as 46 months of age. Overdosage:  If you think you have taken too much of this medicine contact a poison control center or emergency room at once. NOTE: This medicine is only for you. Do not share this medicine with others. What if I miss a dose? If you miss a dose, take it as soon as you can. If it is  almost time for your next dose, take only that dose. Do not take double or extra doses. What may interact with this medicine? Do not take this medicine with any of the following medications: -aminobenzoate potassium -dofetilide -metronidazole This medicine may also interact with the following medications: -ACE inhibitors like benazepril, enalapril, lisinopril, and ramipril -birth control pills -cyclosporine -digoxin -diuretics -indomethacin -medicines for diabetes -methenamine -methotrexate -phenytoin -potassium supplements -pyrimethamine -sulfinpyrazone -tricyclic antidepressants -warfarin This list may not describe all possible interactions. Give your health care provider a list of all the medicines, herbs, non-prescription drugs, or dietary supplements you use. Also tell them if you smoke, drink alcohol, or use illegal drugs. Some items may interact with your medicine. What should I watch for while using this medicine? Tell your doctor or health care professional if your symptoms do not improve. Drink several glasses of water a day to reduce the risk of kidney problems. Do not treat diarrhea with over the counter products. Contact your doctor if you have diarrhea that lasts more than 2 days or if it is severe and watery. This medicine can make you more sensitive to the sun. Keep out of the sun. If you cannot avoid being in the sun, wear protective clothing and use a sunscreen. Do not use sun lamps or tanning beds/booths. What side effects may I notice from receiving this medicine? Side effects that you should report to your doctor or health care professional as soon as possible: -allergic reactions like skin rash or hives, swelling of the face, lips, or tongue -breathing problems -fever or chills, sore throat -irregular heartbeat, chest pain -joint or muscle pain -pain or difficulty passing urine -red pinpoint spots on skin -redness, blistering, peeling or loosening of the  skin, including inside the mouth -unusual bleeding or bruising -unusually weak or tired -yellowing of the eyes or skin Side effects that usually do not require medical attention (report to your doctor or health care professional if they continue or are bothersome): -diarrhea -dizziness -headache -loss of appetite -nausea, vomiting -nervousness This list may not describe all possible side effects. Call your doctor for medical advice about side effects. You may report side effects to FDA at 1-800-FDA-1088. Where should I keep my medicine? Keep out of the reach of children. Store at room temperature between 20 to 25 degrees C (68 to 77 degrees F). Protect from light. Throw away any unused medicine after the expiration date. NOTE: This sheet is a summary. It may not cover all possible information. If you have questions about this medicine, talk to your doctor, pharmacist, or health care provider.    2016, Elsevier/Gold Standard. (2012-08-18 14:38:26)

## 2015-11-21 ENCOUNTER — Telehealth (HOSPITAL_COMMUNITY): Payer: Self-pay | Admitting: *Deleted

## 2015-11-21 MED ORDER — TORSEMIDE 20 MG PO TABS: ORAL_TABLET | ORAL | 3 refills | Status: DC

## 2015-11-21 NOTE — Telephone Encounter (Signed)
Spoke w/pt's wife, she is aware and agreeable, she has metolazone at home and will give a dose, will bring him to ER if needed over weekend, he is sch for f/u on tue 10/31

## 2015-11-21 NOTE — Telephone Encounter (Signed)
Unfortunately, I think we are running out of options with him with progressive renal and cardiac failure.  Poor candidate for hemodialysis.  We had discussed hospice at last appt and that continues to be a reasonable option.  Have him increase torsemide to 80 qam/60 qpm and take a dose of metolazone 2.5 x 1 today.  Needs followup early next week with BMET.  If worsens, will either need to come to hospital or decide on hospice care.

## 2015-11-21 NOTE — Telephone Encounter (Signed)
pts wife called very concerned about pt, she states his wt is increasing, he was 144 lb yesterday and is at 147 lb today.  He started Torsemide 60 mg in AM and 40 mg in PM on Mon 10/23.  He went for paracentesis Tue 10/24 but did not have enough ascites:  IMPRESSION: Minimal fluid. No safe window to proceed with therapeutic Paracentesis.   She reports he is more fatigued and very weak, has no appetite, no LE edema and breathing is at baseline.  Will discuss w/Dr Shirlee Latch and call her back.

## 2015-11-22 ENCOUNTER — Emergency Department (HOSPITAL_COMMUNITY): Payer: Medicare HMO

## 2015-11-22 ENCOUNTER — Inpatient Hospital Stay (HOSPITAL_COMMUNITY)
Admission: EM | Admit: 2015-11-22 | Discharge: 2015-11-24 | DRG: 291 | Disposition: A | Discharge status: Hospice | Payer: Medicare HMO | Attending: Cardiology | Admitting: Cardiology

## 2015-11-22 ENCOUNTER — Encounter (HOSPITAL_COMMUNITY): Payer: Self-pay | Admitting: Emergency Medicine

## 2015-11-22 DIAGNOSIS — I493 Ventricular premature depolarization: Secondary | ICD-10-CM | POA: Diagnosis present

## 2015-11-22 DIAGNOSIS — I5084 End stage heart failure: Secondary | ICD-10-CM | POA: Diagnosis present

## 2015-11-22 DIAGNOSIS — Z7982 Long term (current) use of aspirin: Secondary | ICD-10-CM

## 2015-11-22 DIAGNOSIS — I484 Atypical atrial flutter: Secondary | ICD-10-CM | POA: Diagnosis present

## 2015-11-22 DIAGNOSIS — Z515 Encounter for palliative care: Secondary | ICD-10-CM | POA: Diagnosis not present

## 2015-11-22 DIAGNOSIS — N179 Acute kidney failure, unspecified: Secondary | ICD-10-CM | POA: Diagnosis present

## 2015-11-22 DIAGNOSIS — I509 Heart failure, unspecified: Secondary | ICD-10-CM | POA: Diagnosis not present

## 2015-11-22 DIAGNOSIS — K7469 Other cirrhosis of liver: Secondary | ICD-10-CM | POA: Diagnosis present

## 2015-11-22 DIAGNOSIS — E039 Hypothyroidism, unspecified: Secondary | ICD-10-CM | POA: Diagnosis present

## 2015-11-22 DIAGNOSIS — Z95810 Presence of automatic (implantable) cardiac defibrillator: Secondary | ICD-10-CM

## 2015-11-22 DIAGNOSIS — Z7951 Long term (current) use of inhaled steroids: Secondary | ICD-10-CM

## 2015-11-22 DIAGNOSIS — R64 Cachexia: Secondary | ICD-10-CM | POA: Diagnosis present

## 2015-11-22 DIAGNOSIS — N184 Chronic kidney disease, stage 4 (severe): Secondary | ICD-10-CM | POA: Diagnosis present

## 2015-11-22 DIAGNOSIS — I2721 Secondary pulmonary arterial hypertension: Secondary | ICD-10-CM | POA: Diagnosis present

## 2015-11-22 DIAGNOSIS — I4891 Unspecified atrial fibrillation: Secondary | ICD-10-CM | POA: Diagnosis present

## 2015-11-22 DIAGNOSIS — I255 Ischemic cardiomyopathy: Secondary | ICD-10-CM | POA: Diagnosis present

## 2015-11-22 DIAGNOSIS — I472 Ventricular tachycardia: Secondary | ICD-10-CM | POA: Diagnosis present

## 2015-11-22 DIAGNOSIS — R188 Other ascites: Secondary | ICD-10-CM | POA: Diagnosis present

## 2015-11-22 DIAGNOSIS — M6281 Muscle weakness (generalized): Secondary | ICD-10-CM

## 2015-11-22 DIAGNOSIS — Z87891 Personal history of nicotine dependence: Secondary | ICD-10-CM

## 2015-11-22 DIAGNOSIS — I252 Old myocardial infarction: Secondary | ICD-10-CM

## 2015-11-22 DIAGNOSIS — Z66 Do not resuscitate: Secondary | ICD-10-CM | POA: Diagnosis not present

## 2015-11-22 DIAGNOSIS — M109 Gout, unspecified: Secondary | ICD-10-CM | POA: Diagnosis present

## 2015-11-22 DIAGNOSIS — I2510 Atherosclerotic heart disease of native coronary artery without angina pectoris: Secondary | ICD-10-CM | POA: Diagnosis present

## 2015-11-22 DIAGNOSIS — I5082 Biventricular heart failure: Secondary | ICD-10-CM | POA: Diagnosis present

## 2015-11-22 DIAGNOSIS — I440 Atrioventricular block, first degree: Secondary | ICD-10-CM | POA: Diagnosis present

## 2015-11-22 DIAGNOSIS — I5023 Acute on chronic systolic (congestive) heart failure: Secondary | ICD-10-CM | POA: Diagnosis present

## 2015-11-22 DIAGNOSIS — Z881 Allergy status to other antibiotic agents status: Secondary | ICD-10-CM

## 2015-11-22 DIAGNOSIS — R570 Cardiogenic shock: Secondary | ICD-10-CM | POA: Diagnosis not present

## 2015-11-22 DIAGNOSIS — E785 Hyperlipidemia, unspecified: Secondary | ICD-10-CM | POA: Diagnosis present

## 2015-11-22 DIAGNOSIS — Z79899 Other long term (current) drug therapy: Secondary | ICD-10-CM | POA: Insufficient documentation

## 2015-11-22 DIAGNOSIS — I130 Hypertensive heart and chronic kidney disease with heart failure and stage 1 through stage 4 chronic kidney disease, or unspecified chronic kidney disease: Principal | ICD-10-CM | POA: Diagnosis present

## 2015-11-22 DIAGNOSIS — Z951 Presence of aortocoronary bypass graft: Secondary | ICD-10-CM

## 2015-11-22 DIAGNOSIS — Z8249 Family history of ischemic heart disease and other diseases of the circulatory system: Secondary | ICD-10-CM

## 2015-11-22 DIAGNOSIS — R0600 Dyspnea, unspecified: Secondary | ICD-10-CM | POA: Diagnosis present

## 2015-11-22 LAB — CBC
HCT: 37.3 % — ABNORMAL LOW (ref 39.0–52.0)
Hemoglobin: 12.3 g/dL — ABNORMAL LOW (ref 13.0–17.0)
MCH: 30.8 pg (ref 26.0–34.0)
MCHC: 33 g/dL (ref 30.0–36.0)
MCV: 93.5 fL (ref 78.0–100.0)
PLATELETS: 125 10*3/uL — AB (ref 150–400)
RBC: 3.99 MIL/uL — AB (ref 4.22–5.81)
RDW: 16.8 % — AB (ref 11.5–15.5)
WBC: 6.3 10*3/uL (ref 4.0–10.5)

## 2015-11-22 LAB — BASIC METABOLIC PANEL
Anion gap: 13 (ref 5–15)
BUN: 84 mg/dL — AB (ref 6–20)
CHLORIDE: 96 mmol/L — AB (ref 101–111)
CO2: 21 mmol/L — AB (ref 22–32)
CREATININE: 4.15 mg/dL — AB (ref 0.61–1.24)
Calcium: 9.2 mg/dL (ref 8.9–10.3)
GFR calc Af Amer: 15 mL/min — ABNORMAL LOW (ref >60)
GFR calc non Af Amer: 13 mL/min — ABNORMAL LOW (ref >60)
GLUCOSE: 125 mg/dL — AB (ref 65–99)
Potassium: 4.7 mmol/L (ref 3.5–5.1)
Sodium: 130 mmol/L — ABNORMAL LOW (ref 135–145)

## 2015-11-22 LAB — WOUND CULTURE: GRAM STAIN: NONE SEEN

## 2015-11-22 LAB — HEPATIC FUNCTION PANEL
ALT: 29 U/L (ref 17–63)
AST: 46 U/L — ABNORMAL HIGH (ref 15–41)
Albumin: 3.5 g/dL (ref 3.5–5.0)
Alkaline Phosphatase: 93 U/L (ref 38–126)
Bilirubin, Direct: 0.7 mg/dL — ABNORMAL HIGH (ref 0.1–0.5)
Indirect Bilirubin: 0.7 mg/dL (ref 0.3–0.9)
Total Bilirubin: 1.4 mg/dL — ABNORMAL HIGH (ref 0.3–1.2)
Total Protein: 6.7 g/dL (ref 6.5–8.1)

## 2015-11-22 LAB — PROTIME-INR
INR: 1.45
Prothrombin Time: 17.8 seconds — ABNORMAL HIGH (ref 11.4–15.2)

## 2015-11-22 LAB — MRSA PCR SCREENING: MRSA by PCR: NEGATIVE

## 2015-11-22 LAB — I-STAT TROPONIN, ED: Troponin i, poc: 0.03 ng/mL (ref 0.00–0.08)

## 2015-11-22 LAB — AMMONIA: Ammonia: 22 umol/L (ref 9–35)

## 2015-11-22 MED ORDER — SODIUM CHLORIDE 0.9% FLUSH
3.0000 mL | Freq: Two times a day (BID) | INTRAVENOUS | Status: DC
  Administered 2015-11-22 – 2015-11-24 (×3): 3 mL via INTRAVENOUS

## 2015-11-22 MED ORDER — ACETAMINOPHEN 325 MG PO TABS: 650.0000 mg | ORAL_TABLET | ORAL | Status: DC | PRN

## 2015-11-22 MED ORDER — ASPIRIN EC 81 MG PO TBEC
81.0000 mg | DELAYED_RELEASE_TABLET | Freq: Every day | ORAL | Status: DC
  Administered 2015-11-22 – 2015-11-23 (×2): 81 mg via ORAL
  Filled 2015-11-22 (×2): qty 1

## 2015-11-22 MED ORDER — ALPRAZOLAM 0.25 MG PO TABS
0.2500 mg | ORAL_TABLET | Freq: Every day | ORAL | Status: DC
  Administered 2015-11-22 – 2015-11-23 (×2): 0.25 mg via ORAL
  Filled 2015-11-22 (×2): qty 1

## 2015-11-22 MED ORDER — POTASSIUM CHLORIDE CRYS ER 20 MEQ PO TBCR
40.0000 meq | EXTENDED_RELEASE_TABLET | Freq: Every day | ORAL | Status: DC
  Administered 2015-11-23 – 2015-11-24 (×2): 40 meq via ORAL
  Filled 2015-11-22 (×2): qty 2

## 2015-11-22 MED ORDER — DOBUTAMINE IN D5W 4-5 MG/ML-% IV SOLN
3.0000 ug/kg/min | INTRAVENOUS | Status: DC
  Administered 2015-11-22: 3 ug/kg/min via INTRAVENOUS
  Filled 2015-11-22: qty 250

## 2015-11-22 MED ORDER — POLYVINYL ALCOHOL 1.4 % OP SOLN
1.0000 [drp] | Freq: Every day | OPHTHALMIC | Status: DC
  Administered 2015-11-22 – 2015-11-24 (×3): 1 [drp] via OPHTHALMIC
  Filled 2015-11-22: qty 15

## 2015-11-22 MED ORDER — MELATONIN 3 MG PO TABS
3.0000 mg | ORAL_TABLET | Freq: Every day | ORAL | Status: DC
  Administered 2015-11-22 – 2015-11-23 (×2): 3 mg via ORAL
  Filled 2015-11-22 (×3): qty 1

## 2015-11-22 MED ORDER — LACTULOSE 10 GM/15ML PO SOLN
20.0000 g | Freq: Two times a day (BID) | ORAL | Status: DC
  Administered 2015-11-22: 20 g via ORAL
  Filled 2015-11-22 (×2): qty 30

## 2015-11-22 MED ORDER — AMIODARONE HCL 100 MG PO TABS
100.0000 mg | ORAL_TABLET | Freq: Every day | ORAL | Status: DC
Start: 2015-11-23 — End: 2015-11-24
  Administered 2015-11-23 – 2015-11-24 (×2): 100 mg via ORAL
  Filled 2015-11-22 (×2): qty 1

## 2015-11-22 MED ORDER — ONDANSETRON HCL 4 MG/2ML IJ SOLN: 4.0000 mg | Freq: Four times a day (QID) | INTRAMUSCULAR | Status: DC | PRN

## 2015-11-22 MED ORDER — POLYVINYL ALCOHOL-POVIDONE 1.4-0.6 % OP SOLN: 1.0000 [drp] | Freq: Every day | OPHTHALMIC | Status: DC

## 2015-11-22 MED ORDER — FUROSEMIDE 10 MG/ML IJ SOLN
160.0000 mg | Freq: Two times a day (BID) | INTRAVENOUS | Status: DC
  Administered 2015-11-22 – 2015-11-24 (×4): 160 mg via INTRAVENOUS
  Filled 2015-11-22 (×6): qty 16

## 2015-11-22 MED ORDER — SODIUM CHLORIDE 0.9% FLUSH: 3.0000 mL | INTRAVENOUS | Status: DC | PRN

## 2015-11-22 MED ORDER — FLUTICASONE PROPIONATE 50 MCG/ACT NA SUSP
2.0000 | Freq: Every day | NASAL | Status: DC | PRN
  Filled 2015-11-22: qty 16

## 2015-11-22 MED ORDER — LEVOTHYROXINE SODIUM 75 MCG PO TABS
75.0000 ug | ORAL_TABLET | Freq: Every day | ORAL | Status: DC
  Administered 2015-11-23 – 2015-11-24 (×2): 75 ug via ORAL
  Filled 2015-11-22 (×2): qty 1

## 2015-11-22 MED ORDER — ATORVASTATIN CALCIUM 20 MG PO TABS
20.0000 mg | ORAL_TABLET | Freq: Every day | ORAL | Status: DC
  Administered 2015-11-23: 20 mg via ORAL
  Filled 2015-11-22 (×2): qty 1

## 2015-11-22 MED ORDER — NIACIN ER 500 MG PO CPCR
500.0000 mg | ORAL_CAPSULE | Freq: Every day | ORAL | Status: DC
  Administered 2015-11-22 – 2015-11-23 (×2): 500 mg via ORAL
  Filled 2015-11-22 (×3): qty 1

## 2015-11-22 MED ORDER — SODIUM CHLORIDE 0.9 % IV SOLN: 250.0000 mL | INTRAVENOUS | Status: DC | PRN
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
## 2015-11-22 NOTE — ED Notes (Signed)
Attempted report 

## 2015-11-22 NOTE — ED Provider Notes (Signed)
[facility]-EMERGENCY DEPT Provider Note   CSN: [id number] Arrival date & time: 11/22/15  1007     History   Chief Complaint Chief Complaint  Patient presents with  . Urinary Retention  . Congestive Heart Failure    abd swelling    HPI [name] Roberts a 75 y.o. male.  HPI   75 year old male with a history of coronary artery disease status post CABG, ischemic cardiomyopathy with ICD in EF of 20% presents today with complaints of decreased urine output. Patient also has a significant past medical history of cirrhosis and  congestive heart failure. 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 Patient was started on spironolactone to control ascites. Patient had labs drawn a week and a half ago that showed significant rise in BUN and creatinine. Digoxin torsemide and spironolactone were held. Patient followed up with Dr. Shirlee Latch on 11/17/2015 after worsening dyspnea and abdominal distention.   He was started back on furosemide 60 every morning, 40 every afternoon, continue low-dose Coreg.   Patient reports that he has been more fatigued, denies any specific shortness of breath, reports increased abdominal distention. She denies any fever chills or chest pain. Family notes that his blood pressure Roberts usually low.   Past Medical History:  Diagnosis Date  . Arthritis    "right thumb; posterior neck" (08/02/2013)  . CAD (coronary artery disease)   . Cardiomyopathy, ischemic   . CHF (congestive heart failure) (HCC)   . Chronic systolic heart failure (HCC)   . Gout   . Heart murmur   . History of blood transfusion    "S/P OHS"  . HLD (hyperlipidemia)   . Iron deficiency anemia    "S/P OHS"  . Myocardial infarction 1981  . Pneumonia 12/20206    Patient Active Problem List   Diagnosis Date Noted  . Low output heart failure (HCC) 11/22/2015  . Cardiogenic shock (HCC)   . AKI (acute kidney injury) (HCC)   . Cirrhosis, cardiac 10/04/2015  . Anxiety state  09/20/2015  . Gout 09/20/2015  . Hyperkalemia 09/20/2015  . Loss of consciousness (HCC) 09/11/2015  . Hypothyroidism 08/08/2015  . Sleep difficulties 08/02/2015  . h/o AMI 08/02/2015  . Dysphagia- diff swallowing solids and liquids 08/02/2015  . Arthritis 07/12/2015  . HLD (hyperlipidemia) 07/12/2015  . h/o Iron deficiency anemia 07/12/2015  . Acute on chronic kidney failure (HCC) 07/08/2015  . Severe left ventricular systolic dysfunction   . 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 h/o Syncope 06/20/2015  . Normocytic anemia 06/20/2015  . Peripheral edema 09/27/2014  . Ascites 09/27/2014  . Abdominal distention- due to CHF 09/27/2014  . H/o Herpes zoster 07/23/2014  . Counseling regarding end of life decision making 06/18/2014  . Elevated transaminase level 06/13/2012  . CARDIOMYOPATHY, ISCHEMIC 09/05/2008  . Hyperlipidemia 03/07/2008  . Gout of multiple sites 03/07/2008  . CAD  (Dr Reatha Harps) 03/07/2008  . Acute on chronic systolic (congestive) heart failure 03/07/2008  . Automatic implantable cardioverter-defibrillator in situ (Dr Ladona Ridgel- EP-Cards) 03/07/2008  . Personal history of tobacco use 04/26/1955    Past Surgical History:  Procedure Laterality Date  . AORTIC VALVE REPAIR  2007   "put an O ring in it"  . APPENDECTOMY    . BAND HEMORRHOIDECTOMY    . CARDIAC CATHETERIZATION  191; 2007  . CARDIAC CATHETERIZATION N/A 06/26/2015   Procedure: Right Heart Cath;  Surgeon: Laurey Morale, MD;  Location: [facility] INVASIVE CV LAB;  Service: Cardiovascular;  Laterality: N/A;  . CARDIAC CATHETERIZATION N/A 09/23/2015   Procedure: Right Heart Cath;  Surgeon: Laurey Morale, MD;  Location: [facility] INVASIVE CV LAB;  Service: Cardiovascular;  Laterality: N/A;  . CARDIAC DEFIBRILLATOR PLACEMENT  2007   Dr. Ladona Ridgel  . CORONARY ARTERY BYPASS GRAFT  2007   Dr. Barry Dienes  . ICD GENERATOR CHANGE  08/02/2013   "w/new leads"  . IMPLANTABLE CARDIOVERTER DEFIBRILLATOR  (ICD) GENERATOR CHANGE N/A 08/02/2013   Procedure: ICD GENERATOR CHANGE;  Surgeon: Marinus Maw, MD;  Location: [facility] CATH LAB;  Service: Cardiovascular;  Laterality: N/A;  . PILONIDAL CYST EXCISION  1961       Home Medications    Prior to Admission medications   Medication Sig Start Date End Date Taking? Authorizing Provider  allopurinol (ZYLOPRIM) 100 MG tablet TAKE 1 TABLET BY MOUTH DAILY. Patient taking differently: Take 100 mg by mouth daily 05/26/15  Yes Amy E Bedsole, MD  ALPRAZolam (XANAX) 0.5 MG tablet Take 1/2 to 1 tab nightly before bed. Patient taking differently: Take 0.25-0.5 mg by mouth at bedtime.  10/04/15  Yes Deborah Opalski, DO  amiodarone (PACERONE) 200 MG tablet Take 0.5 tablets (100 mg total) by mouth daily. 09/24/15  Yes Graciella Freer, PA-C  aspirin EC 81 MG tablet Take 81 mg by mouth at bedtime.    Yes Historical Provider, MD  atorvastatin (LIPITOR) 20 MG tablet Take 1 tablet (20 mg total) by mouth daily at 6 PM. 07/23/15  Yes Graciella Freer, PA-C  carvedilol (COREG) 3.125 MG tablet Take 0.5 tablets (1.5625 mg total) by mouth 2 (two) times daily with a meal. 09/24/15  Yes Graciella Freer, PA-C  fluticasone [facility]) 50 MCG/ACT nasal spray Place 2 sprays into both nostrils daily. Patient taking differently: Place 2 sprays into both nostrils daily as needed for allergies.  07/31/15  Yes Deborah Opalski, DO  lactulose (CHRONULAC) 10 GM/15ML solution Take 30 mLs (20 g total) by mouth 3 (three) times daily. Patient taking differently: Take 20 g by mouth 2 (two) times daily.  09/24/15  Yes Graciella Freer, PA-C  levothyroxine (SYNTHROID, LEVOTHROID) 50 MCG tablet Take 1.5 tablets (75 mcg total) by mouth daily. 09/24/15  Yes Shanker Levora Dredge, MD  Melatonin 3 MG CAPS Take 3 mg by mouth at bedtime.   Yes Historical Provider, MD  metolazone (ZAROXOLYN) 2.5 MG tablet Take 2.5 mg by mouth daily as needed (per Dr. Shirlee Latch).   Yes Historical Provider, MD  niacin  (NIASPAN) 500 MG CR tablet Take 1 tablet (500 mg total) by mouth at bedtime. 06/18/14  Yes Amy Michelle Nasuti, MD  polyvinyl alcohol-povidone (REFRESH) 1.4-0.6 % ophthalmic solution Place 1 drop into the left eye daily.    Yes Historical Provider, MD  sulfamethoxazole-trimethoprim (BACTRIM DS,SEPTRA DS) 800-160 MG tablet Take 1 tablet by mouth 2 (two) times daily. 11/19/15  Yes Deborah Opalski, DO  torsemide (DEMADEX) 20 MG tablet Take 4 tabs in AM and 3 tabs in PM Patient taking differently: Take 60-80 mg by mouth See admin instructions. Take 80 mg by mouth in the morning and take 60 mg by mouth in the evening. 11/21/15  Yes Laurey Morale, MD    Family History Family History  Problem Relation Age of Onset  . Cancer Mother     brain cancer  . Heart disease Father     CAD AND CHF    Social History Social History  Substance Use Topics  . Smoking status: Former  Smoker    Packs/day: 2.50    Years: 56.00    Types: Cigarettes    Quit date: 01/18/2005  . Smokeless tobacco: Never Used  . Alcohol use 0.0 oz/week     Comment: Rare     Allergies   Erythromycin base   Review of Systems Review of Systems  All other systems reviewed and are negative.    Physical Exam Updated Vital Signs BP 97/65   Pulse 74   Temp 97.3 F (36.3 C)   Resp 13   SpO2 100%   Physical Exam  Constitutional: He Roberts oriented to person, place, and time. He appears well-developed and well-nourished.  HENT:  Head: Normocephalic and atraumatic.  Eyes: Conjunctivae are normal. Pupils are equal, round, and reactive to light. Right eye exhibits no discharge. Left eye exhibits no discharge. No scleral icterus.  Neck: Normal range of motion. No JVD present. No tracheal deviation present.  Pulmonary/Chest: Effort normal. No stridor.  Abdominal:  Abdominal distention   Neurological: He Roberts alert and oriented to person, place, and time. Coordination normal.  Psychiatric: He has a normal mood and affect. His  behavior Roberts normal. Judgment and thought content normal.  Nursing note and vitals reviewed.    ED Treatments / Results  Labs (all labs ordered are listed, but only abnormal results are displayed) Labs Reviewed  BASIC METABOLIC PANEL - Abnormal; Notable for the following:       Result Value   Sodium 130 (*)    Chloride 96 (*)    CO2 21 (*)    Glucose, Bld 125 (*)    BUN 84 (*)    Creatinine, Ser 4.15 (*)    GFR calc non Af Amer 13 (*)    GFR calc Af Amer 15 (*)    All other components within normal limits  CBC - Abnormal; Notable for the following:    RBC 3.99 (*)    Hemoglobin 12.3 (*)    HCT 37.3 (*)    RDW 16.8 (*)    Platelets 125 (*)    All other components within normal limits  HEPATIC FUNCTION PANEL - Abnormal; Notable for the following:    AST 46 (*)    Total Bilirubin 1.4 (*)    Bilirubin, Direct 0.7 (*)    All other components within normal limits  PROTIME-INR - Abnormal; Notable for the following:    Prothrombin Time 17.8 (*)    All other components within normal limits  I-STAT TROPOININ, ED    EKG  EKG Interpretation None       Radiology Dg Chest 2 View  Result Date: 11/22/2015 CLINICAL DATA:  Fluid overload EXAM: CHEST  2 VIEW COMPARISON:  09/20/2015 chest radiograph. FINDINGS: Sternotomy wires appear aligned and intact. Stable configuration of two lead left subclavian ICD and aortic valve prosthesis. Stable cardiomediastinal silhouette with mild cardiomegaly and aortic atherosclerosis. No pneumothorax. No pleural effusion. Cephalization of the pulmonary vasculature without overt pulmonary edema. No acute consolidative airspace disease. IMPRESSION: Stable mild cardiomegaly without overt pulmonary edema. Aortic atherosclerosis. Electronically Signed   By: Delbert PhenixJason A Poff M.D.   On: 11/22/2015 11:43    Procedures Procedures (including critical care time)  Medications Ordered in ED Medications  DOBUTamine (DOBUTREX) infusion 4000 mcg/mL (not  administered)     Initial Impression / Assessment and Plan / ED Course  I have reviewed the triage vital signs and the nursing notes.  Pertinent labs & imaging results that were available during my care of  the patient were reviewed by me and considered in my medical decision making (see chart for details).  Clinical Course    Final Clinical Impressions(s) / ED Diagnoses   Final diagnoses:  Low output heart failure (HCC)    Labs:  Imaging:  Consults:  Therapeutics:  Discharge Meds:   Assessment/Plan:  75 year old male presents today with complaints of abdominal distention and decreased urinary output. Patient reports that over the last day he has had very little urinary output. He recently was seen by heart failure clinic for cardiorenal syndrome with poor prognosis for heart failure. Patient has significant ascites, decreased urinary output. I consulted  Dr. Shirlee Latch who would evaluate the patient here [facility] with likely hospital admission.  Nursing staff notes that patient had 400 mL in his bladder, but was unable to urinate. Foley catheter to be placed.  Very minimal urine output from catheter. Cardiology to admit. Pt remained stable here in the ED     New Prescriptions New Prescriptions   No medications on file     Eyvonne Mechanic, PA-C 11/22/15 1649    Geoffery Lyons, MD 11/23/15 [phone]

## 2015-11-22 NOTE — ED Triage Notes (Signed)
Pt is CHF pt. Was instructed yesterday to take 7 tablets of  torsemide yesterday and 4 tablets this morning. Pt also took metolazone yesterday as well for swelling. Pt here due to minimal urine output. Pt states he only "dribbled" this morning and had less than a cup yesterday of output.

## 2015-11-22 NOTE — ED Notes (Signed)
Report given to Ceaser RN 

## 2015-11-22 NOTE — ED Notes (Signed)
Placed a foley holder on patient leg

## 2015-11-22 NOTE — ED Notes (Signed)
Patient transported to X-ray 

## 2015-11-23 ENCOUNTER — Inpatient Hospital Stay (HOSPITAL_COMMUNITY): Payer: Medicare HMO

## 2015-11-23 DIAGNOSIS — Z515 Encounter for palliative care: Secondary | ICD-10-CM

## 2015-11-23 DIAGNOSIS — R188 Other ascites: Secondary | ICD-10-CM

## 2015-11-23 DIAGNOSIS — I509 Heart failure, unspecified: Secondary | ICD-10-CM

## 2015-11-23 LAB — BASIC METABOLIC PANEL
ANION GAP: 11 (ref 5–15)
BUN: 87 mg/dL — AB (ref 6–20)
CALCIUM: 9.1 mg/dL (ref 8.9–10.3)
CO2: 23 mmol/L (ref 22–32)
Chloride: 93 mmol/L — ABNORMAL LOW (ref 101–111)
Creatinine, Ser: 4.43 mg/dL — ABNORMAL HIGH (ref 0.61–1.24)
GFR calc Af Amer: 14 mL/min — ABNORMAL LOW (ref >60)
GFR, EST NON AFRICAN AMERICAN: 12 mL/min — AB (ref >60)
GLUCOSE: 92 mg/dL (ref 65–99)
Potassium: 4.2 mmol/L (ref 3.5–5.1)
Sodium: 127 mmol/L — ABNORMAL LOW (ref 135–145)

## 2015-11-23 LAB — URINE MICROSCOPIC-ADD ON

## 2015-11-23 LAB — URINALYSIS, ROUTINE W REFLEX MICROSCOPIC
Bilirubin Urine: NEGATIVE
GLUCOSE, UA: NEGATIVE mg/dL
KETONES UR: NEGATIVE mg/dL
LEUKOCYTES UA: NEGATIVE
Nitrite: NEGATIVE
PH: 5.5 (ref 5.0–8.0)
PROTEIN: NEGATIVE mg/dL
Specific Gravity, Urine: 1.01 (ref 1.005–1.030)

## 2015-11-23 MED ORDER — METOLAZONE 5 MG PO TABS
5.0000 mg | ORAL_TABLET | Freq: Once | ORAL | Status: AC
  Administered 2015-11-23: 5 mg via ORAL
  Filled 2015-11-23: qty 1

## 2015-11-23 MED ORDER — MORPHINE SULFATE 10 MG/5ML PO SOLN: 2.5000 mg | ORAL | Status: DC | PRN

## 2015-11-23 NOTE — Progress Notes (Signed)
Patient has an order to deactivate his ICD and has been made a DNR. He still is currently on a dobutamine drip. Paged on call Cardiologist that advised will leave everything as is until patient is seen by cardiology in the am.

## 2015-11-23 NOTE — Consult Note (Signed)
Consultation Note Date: 11/23/2015   Patient Name: [name] Roberts  DOB: 21-Jul-1940  MRN: [id number]  Age / Sex: 75 y.o., male  PCP: Mellody Dance, DO Referring Physician: Larey Dresser, MD  Reason for Consultation: Establishing goals of care, Hospice Evaluation, Non pain symptom management and Psychosocial/spiritual support  HPI/Patient Profile: 75 y.o. male  with past medical history of CKD, CAD, Cirrhosis, sCHF, MI in 1981, ICD, RV failure, hypothyroidism,  admitted on 11/22/2015 with abdominal distension and decreased urine output despite diuretics. Pt has end stage ischemic cardiomyopathy, low output. Echo in 8/17 with EF 20% and moderately dilated/severely dysfunctional RV. He has prominent RV failure. Has [employer] ICD. [place] during 8/17 admission showed mildly elevated filling pressures and CI low at 1.98. Digoxin at low dose was added but now off digoxin with AKI. He has been steadily worsening over the last several weeks with volume overload but creatinine also rising significantly. Marland Kitchen  He was started on dobutamine 3 at admission and high dose Lasix.   He is not a candidate for advanced therapies with RV failure and renal failure. Not a candidate for HD.  Clinical Assessment and Goals of Care: Met with pt, pt's wife, dtr, and son. Pt is a/o x 3. They share they have been considering hospice before this admission. Pt shares he has felt an acute decline over the past year. He struggles with not being able to do the things he once enjoyed. He can only walk short distances in the home before having to sit down. He has lost his appetite. He verbalizes that he wants to remain in his home and feels that he is nearing end of life. He also wants his defibrillator deactivated. He reports it has not fired before. He and his wife also share he wishes to be a DNR  Pt at this point can speak for himself. His  wife would be his healthcare surrogate    SUMMARY OF RECOMMENDATIONS   DNR/DNI Deactivate defibrillator Recognizes he is not a HD candidate; nor is this something he would want Education provided on opioids for emergency mangement of dyspnea. Pt agreeable to trial while he is in the hospital Code Status/Advance Care Planning:  DNR    Symptom Management:   Dyspnea: Add low dose morphine concentrate 2.5-35m q4 prn. Pt is drug naive  Palliative Prophylaxis:   Bowel Regimen, Delirium Protocol, Frequent Pain Assessment, Oral Care and Turn Reposition  Additional Recommendations (Limitations, Scope, Preferences):  Avoid Hospitalization, Minimize Medications, No Artificial Feeding, No Chemotherapy, No Hemodialysis, No Radiation, No Surgical Procedures and No Tracheostomy  Psycho-social/Spiritual:   Desire for further Chaplaincy support:no  Additional Recommendations: Grief/Bereavement Support  Prognosis:   < 6 months in the setting of end stage sCHF and now acute renal failure.  Discharge Planning: Home with Hospice      Primary Diagnoses: Present on Admission: **None**   I have reviewed the medical record, interviewed the patient and family, and examined the patient. The following aspects are pertinent.  Past Medical History:  Diagnosis Date  . Arthritis    "right thumb; posterior neck" (08/02/2013)  . CAD (coronary artery disease)   . Cardiomyopathy, ischemic   . CHF (congestive heart failure) ([place])   . Chronic systolic heart failure ([place])   . Gout   . Heart murmur   . History of blood transfusion    "S/P OHS"  . HLD (hyperlipidemia)   . Iron deficiency anemia    "S/P OHS"  . Myocardial infarction 1981  . Pneumonia 12/20206   Social History   Social History  . Marital status: Married    Spouse name: N/A  . Number of children: 2  . Years of education: N/A   Occupational History  . Retired    Social History Main Topics  . Smoking status: Former  Smoker    Packs/day: 2.50    Years: 56.00    Types: Cigarettes    Quit date: 01/18/2005  . Smokeless tobacco: Never Used  . Alcohol use 0.0 oz/week     Comment: Rare  . Drug use: No     Comment: "a little speed when I was much younger"  . Sexual activity: No   Other Topics Concern  . None   Social History Narrative   Exercise: 2 miles walking daily   Diet: Moderate...some fruit and veggies, but eats some meat/potatos.   Limited fast food.   Drinks about 1 cup of coffee a day          Family History  Problem Relation Age of Onset  . Cancer Mother     brain cancer  . Heart disease Father     CAD AND CHF   Scheduled Meds: . ALPRAZolam  0.25 mg Oral QHS  . amiodarone  100 mg Oral Daily  . aspirin EC  81 mg Oral QHS  . atorvastatin  20 mg Oral q1800  . furosemide  160 mg Intravenous BID  . levothyroxine  75 mcg Oral QAC breakfast  . Melatonin  3 mg Oral QHS  . niacin  500 mg Oral QHS  . polyvinyl alcohol  1 drop Left Eye Daily  . potassium chloride  40 mEq Oral Daily  . sodium chloride flush  3 mL Intravenous Q12H   Continuous Infusions: . DOBUTamine 3 mcg/kg/min (11/22/15 1655)   PRN Meds:.sodium chloride, acetaminophen, fluticasone, ondansetron (ZOFRAN) IV, sodium chloride flush Medications Prior to Admission:  Prior to Admission medications   Medication Sig Start Date End Date Taking? Authorizing Provider  allopurinol (ZYLOPRIM) 100 MG tablet TAKE 1 TABLET BY MOUTH DAILY. Patient taking differently: Take 100 mg by mouth daily 05/26/15  Yes Amy E Bedsole, MD  ALPRAZolam (XANAX) 0.5 MG tablet Take 1/2 to 1 tab nightly before bed. Patient taking differently: Take 0.25-0.5 mg by mouth at bedtime.  10/04/15  Yes Deborah Opalski, DO  amiodarone (PACERONE) 200 MG tablet Take 0.5 tablets (100 mg total) by mouth daily. 09/24/15  Yes Shirley Friar, PA-C  aspirin EC 81 MG tablet Take 81 mg by mouth at bedtime.    Yes Historical Provider, MD  atorvastatin (LIPITOR) 20 MG  tablet Take 1 tablet (20 mg total) by mouth daily at 6 PM. 07/23/15  Yes Shirley Friar, PA-C  carvedilol (COREG) 3.125 MG tablet Take 0.5 tablets (1.5625 mg total) by mouth 2 (two) times daily with a meal. 09/24/15  Yes Shirley Friar, PA-C  fluticasone [facility]) 50 MCG/ACT nasal spray Place 2 sprays into both nostrils daily. Patient taking differently: Place 2 sprays  into both nostrils daily as needed for allergies.  07/31/15  Yes Deborah Opalski, DO  lactulose (CHRONULAC) 10 GM/15ML solution Take 30 mLs (20 g total) by mouth 3 (three) times daily. Patient taking differently: Take 20 g by mouth 2 (two) times daily.  09/24/15  Yes Shirley Friar, PA-C  levothyroxine (SYNTHROID, LEVOTHROID) 50 MCG tablet Take 1.5 tablets (75 mcg total) by mouth daily. 09/24/15  Yes Shanker Kristeen Mans, MD  Melatonin 3 MG CAPS Take 3 mg by mouth at bedtime.   Yes Historical Provider, MD  metolazone (ZAROXOLYN) 2.5 MG tablet Take 2.5 mg by mouth daily as needed (per Dr. Aundra Dubin).   Yes Historical Provider, MD  niacin (NIASPAN) 500 MG CR tablet Take 1 tablet (500 mg total) by mouth at bedtime. 06/18/14  Yes Amy Cletis Athens, MD  polyvinyl alcohol-povidone (REFRESH) 1.4-0.6 % ophthalmic solution Place 1 drop into the left eye daily.    Yes Historical Provider, MD  sulfamethoxazole-trimethoprim (BACTRIM DS,SEPTRA DS) 800-160 MG tablet Take 1 tablet by mouth 2 (two) times daily. 11/19/15  Yes Deborah Opalski, DO  torsemide (DEMADEX) 20 MG tablet Take 4 tabs in AM and 3 tabs in PM Patient taking differently: Take 60-80 mg by mouth See admin instructions. Take 80 mg by mouth in the morning and take 60 mg by mouth in the evening. 11/21/15  Yes Larey Dresser, MD   Allergies  Allergen Reactions  . Erythromycin Base Swelling and Other (See Comments)    Watery red eyes and red streaks down the face   Review of Systems  Constitutional: Positive for activity change, appetite change and fatigue.  HENT: Negative.     Eyes: Negative.   Respiratory: Positive for cough and shortness of breath.   Endocrine: Negative.   Genitourinary: Positive for decreased urine volume and difficulty urinating.  Musculoskeletal:       I cant get comfortable  Allergic/Immunologic: Negative.   Neurological: Positive for weakness.  Hematological: Bruises/bleeds easily.  Psychiatric/Behavioral:       Restless     Physical Exam  Constitutional: He is oriented to person, place, and time.  Frail older man. Appears uncomfortable, restless  HENT:  Head: Normocephalic and atraumatic.  Pulmonary/Chest: Effort normal.  Abdominal: He exhibits distension. There is tenderness.  ascites  Musculoskeletal: Normal range of motion.  Very weak  Neurological: He is alert and oriented to person, place, and time.  Skin: Skin is warm and dry.  Psychiatric: He has a normal mood and affect. His behavior is normal. Judgment and thought content normal.  Nursing note and vitals reviewed.   Vital Signs: BP 95/65 (BP Location: Right Arm)   Pulse 82   Temp 97.7 F (36.5 C) (Axillary)   Resp 16   Ht '5\' 3"'  (1.6 m)   Wt 68.5 kg (151 lb)   SpO2 98%   BMI 26.75 kg/m  Pain Assessment: 0-10   Pain Score: 0-No pain   SpO2: SpO2: 98 % O2 Device:SpO2: 98 % O2 Flow Rate: .O2 Flow Rate (L/min): 2 L/min  IO: Intake/output summary:  Intake/Output Summary (Last 24 hours) at 11/23/15 1743 Last data filed at 11/23/15 1500  Gross per 24 hour  Intake           818.14 ml  Output              250 ml  Net           568.14 ml    LBM: Last BM Date: 11/22/15 Baseline  Weight: Weight: 68 kg (150 lb) Most recent weight: Weight: 68.5 kg (151 lb)     Palliative Assessment/Data:     Time In: 1600 Time Out: 1720 Time Total: 80 min Greater than 50%  of this time was spent counseling and coordinating care related to the above assessment and plan.  Signed by: Dory Horn, NP   Please contact Palliative Medicine Team phone at [phone]  for questions and concerns.  For individual provider: See Shea Evans

## 2015-11-23 NOTE — Progress Notes (Signed)
SUBJECTIVE: Feels a little better today, per family seems more awake. Some urine out this morning.  Abdominal US today showed only a small amount of ascites.    He is on dobutamine 3.   Scheduled Meds: . ALPRAZolam  0.25 mg Oral QHS  . amiodarone  100 mg Oral Daily  . aspirin EC  81 mg Oral QHS  . atorvastatin  20 mg Oral q1800  . furosemide  160 mg Intravenous BID  . levothyroxine  75 mcg Oral QAC breakfast  . Melatonin  3 mg Oral QHS  . metolazone  5 mg Oral Once  . niacin  500 mg Oral QHS  . polyvinyl alcohol  1 drop Left Eye Daily  . potassium chloride  40 mEq Oral Daily  . sodium chloride flush  3 mL Intravenous Q12H   Continuous Infusions: . DOBUTamine 3 mcg/kg/min (11/22/15 1655)   PRN Meds:.sodium chloride, acetaminophen, fluticasone, ondansetron (ZOFRAN) IV, sodium chloride flush    Vitals:   11/22/15 2355 11/23/15 0000 11/23/15 0400 11/23/15 0746  BP: 95/69 94/75 95/65  98/66  Pulse: 68 67 70 70  Resp: 17 20 15 11   Temp: 98.6 F (37 C)  97.8 F (36.6 C) 97.5 F (36.4 C)  TempSrc:    Oral  SpO2: 99% 100% 99% 97%  Weight:   151 lb (68.5 kg)   Height:        Intake/Output Summary (Last 24 hours) at 11/23/15 1157 Last data filed at 11/23/15 1035  Gross per 24 hour  Intake           818.14 ml  Output             2100 ml  Net         -1281.86 ml    LABS: Basic Metabolic Panel:  Recent Labs  78/29/5610/28/17 1033 11/23/15 0422  NA 130* 127*  K 4.7 4.2  CL 96* 93*  CO2 21* 23  GLUCOSE 125* 92  BUN 84* 87*  CREATININE 4.15* 4.43*  CALCIUM 9.2 9.1   Liver Function Tests:  Recent Labs  11/22/15 1033  AST 46*  ALT 29  ALKPHOS 93  BILITOT 1.4*  PROT 6.7  ALBUMIN 3.5   No results for input(s): LIPASE, AMYLASE in the last 72 hours. CBC:  Recent Labs  11/22/15 1033  WBC 6.3  HGB 12.3*  HCT 37.3*  MCV 93.5  PLT 125*   Cardiac Enzymes: No results for input(s): CKTOTAL, CKMB, CKMBINDEX, TROPONINI in the last 72 hours. BNP: Invalid  input(s): POCBNP D-Dimer: No results for input(s): DDIMER in the last 72 hours. Hemoglobin A1C: No results for input(s): HGBA1C in the last 72 hours. Fasting Lipid Panel: No results for input(s): CHOL, HDL, LDLCALC, TRIG, CHOLHDL, LDLDIRECT in the last 72 hours. Thyroid Function Tests: No results for input(s): TSH, T4TOTAL, T3FREE, THYROIDAB in the last 72 hours.  Invalid input(s): FREET3 Anemia Panel: No results for input(s): VITAMINB12, FOLATE, FERRITIN, TIBC, IRON, RETICCTPCT in the last 72 hours.  RADIOLOGY: Dg Chest 2 View  Result Date: 11/22/2015 CLINICAL DATA:  Fluid overload EXAM: CHEST  2 VIEW COMPARISON:  09/20/2015 chest radiograph. FINDINGS: Sternotomy wires appear aligned and intact. Stable configuration of two lead left subclavian ICD and aortic valve prosthesis. Stable cardiomediastinal silhouette with mild cardiomegaly and aortic atherosclerosis. No pneumothorax. No pleural effusion. Cephalization of the pulmonary vasculature without overt pulmonary edema. No acute consolidative airspace disease. IMPRESSION: Stable mild cardiomegaly without overt pulmonary edema. Aortic atherosclerosis. Electronically Signed  By: Delbert Phenix M.D.   On: 11/22/2015 11:43   US Abdomen Limited  Result Date: 11/23/2015 CLINICAL DATA:  Evaluate for ascites EXAM: LIMITED ABDOMEN ULTRASOUND FOR ASCITES TECHNIQUE: Limited ultrasound survey for ascites was performed in all four abdominal quadrants. COMPARISON:  11/18/2015 limited abdominal sonogram FINDINGS: Small volume ascites is seen in all 4 abdominal quadrants, with the deepest ascites pocket in the right lower quadrant measuring 2.7 cm in depth. Incidentally noted diffusely irregular liver surface consistent with cirrhosis. IMPRESSION: 1. Small volume ascites, deepest in the right lower quadrant. 2. Cirrhosis. Electronically Signed   By: Delbert Phenix M.D.   On: 11/23/2015 10:17   US Abdomen Limited  Result Date: 11/18/2015 CLINICAL DATA:   History of congestive heart failure with recurrent abdominal ascites. Request is made for therapeutic paracentesis with an of fluid is present. EXAM: US ABDOMEN LIMITED COMPARISON:  None. FINDINGS: Trace ascites noted throughout the abdomen. IMPRESSION: Minimal fluid. No safe window to proceed with therapeutic paracentesis. Read by: Barnetta Chapel, PA-C Electronically Signed   By: Corlis Leak M.D.   On: 11/18/2015 11:00   US Abdomen Limited  Result Date: 10/31/2015 CLINICAL DATA:  Abdominal ascites evaluation. EXAM: LIMITED ABDOMEN ULTRASOUND FOR ASCITES TECHNIQUE: Limited ultrasound survey for ascites was performed in all four abdominal quadrants. COMPARISON:  Abdominal ultrasound of September 22, 2015 FINDINGS: A small amount of ascites is noted in the lower quadrants bilaterally. No volume of fluid sufficient for successful paracentesis is observed. IMPRESSION: Small amount of ascites insufficient for successful paracentesis. Electronically Signed   By: David  [place] M.D.   On: 10/31/2015 11:07    PHYSICAL EXAM General: Frail, tired Neck: JVP 12+ cm, no thyromegaly or thyroid nodule.  Lungs: Clear to auscultation bilaterally with normal respiratory effort. CV: Lateral PMI.  Heart irregular S1/S2, no S3/S4, no murmur.  No peripheral edema.   Abdomen: Soft, nontender, no hepatosplenomegaly, moderate distention.  Neurologic: Alert and oriented x 3.  Psych: Normal affect. Extremities: No clubbing or cyanosis.   TELEMETRY: Reviewed telemetry pt in atypical atrial flutter, rate around 70  ASSESSMENT AND PLAN: 75 yo with end stage biventricular failure, CKD, CAD, cirrhosis presented with dyspnea, poor UOP, lethargy.  Thought to be in low output heart failure.  1. Acute on chronic systolic CHF: End stage ischemic cardiomyopathy, suspect low output. Echo in 8/17 with EF 20% and moderately dilated/severely dysfunctional RV. He has prominent RV failure. Has Medtronic ICD. RHC during 8/17 admission showed  mildly elevated filling pressures and CI low at 1.98. Digoxin at low dose was added but now off digoxin with AKI. He has been steadily worsening over the last several weeks with volume overload but creatinine also rising significantly.  Strong suspicion for low output failure.  He was started on dobutamine 3 at admission and high dose Lasix.  - Would avoid invasive procedures (RHC, PICC placement, etc) and treat HF empirically, suspect end-stage.  - He is not a candidate for advanced therapies with RV failure and renal failure.  Not a candidate for HD.  We have discussed hospice referral in the past, he and family have been considering.  At this point, I do not think there is going to be much more that we are able to do besides keep him comfortable.  I would like to try to get some of the fluid off of him to help with this.   - Continue dobutamine 3 mcg/kg/min to try to increase cardiac output and allow diuresis.   -  Lasix 160 mg IV bid to continue, some UOP this morning.  Will add metolazone 5 mg po x 1 today.  - I am going to ask our palliative care service to see him.  I think DNR/DNI and turning off ICD with comfort care would be appropriate, he seems to be moving in that direction at this point. Family wants to be here to talk with palliative care.  - Stopped Coreg with low output.  - He is not on ACEI/ARB given elevated creatinine.  - Off digoxin with AKI.  - Abdomen distended but only small ascites on [place] => no paracentesis.  2. 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 Creatinine up to 4.43.   3. CAD: Stable, no chest pain. Continue ASA 81 and statin.  4. Hypothyroidism: In setting of amiodarone use. Levoxylfollowed by PCP.  5. Cirrhosis: ?Etiology, may be 2/2 to RV failure. Never a heavy drinker, viral hepatitis serologies negative. He already had cirrhosis on imaging prior to amiodarone initiation.  - NH3 22.  Wants to stop lactulose =>  will stop today .  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 Continue amiodarone to 100 mg daily with cirrhosis (though think cirrhosis pre-dated amiodarone).  7. S/p MV repair: Stable by echo in 8/17.  8. Atypical atrial flutter: Patient in atypical atrial flutter in 70s today.  This is new for him.  Given overall prognosis, would not anticoagulate.  He is on amiodarone, will continue for now.   Marca Ancona 11/23/2015 12:07 PM

## 2015-11-24 DIAGNOSIS — Z515 Encounter for palliative care: Secondary | ICD-10-CM

## 2015-11-24 LAB — BASIC METABOLIC PANEL
Anion gap: 13 (ref 5–15)
BUN: 88 mg/dL — AB (ref 6–20)
CALCIUM: 9 mg/dL (ref 8.9–10.3)
CO2: 21 mmol/L — ABNORMAL LOW (ref 22–32)
Chloride: 91 mmol/L — ABNORMAL LOW (ref 101–111)
Creatinine, Ser: 4.72 mg/dL — ABNORMAL HIGH (ref 0.61–1.24)
GFR calc Af Amer: 13 mL/min — ABNORMAL LOW (ref >60)
GFR, EST NON AFRICAN AMERICAN: 11 mL/min — AB (ref >60)
GLUCOSE: 88 mg/dL (ref 65–99)
Potassium: 4.2 mmol/L (ref 3.5–5.1)
Sodium: 125 mmol/L — ABNORMAL LOW (ref 135–145)

## 2015-11-24 LAB — CBC
HCT: 35.1 % — ABNORMAL LOW (ref 39.0–52.0)
Hemoglobin: 11.7 g/dL — ABNORMAL LOW (ref 13.0–17.0)
MCH: 31.2 pg (ref 26.0–34.0)
MCHC: 33.3 g/dL (ref 30.0–36.0)
MCV: 93.6 fL (ref 78.0–100.0)
Platelets: 99 10*3/uL — ABNORMAL LOW (ref 150–400)
RBC: 3.75 MIL/uL — ABNORMAL LOW (ref 4.22–5.81)
RDW: 16.6 % — AB (ref 11.5–15.5)
WBC: 6.2 10*3/uL (ref 4.0–10.5)

## 2015-11-24 MED ORDER — TORSEMIDE 20 MG PO TABS
80.0000 mg | ORAL_TABLET | Freq: Two times a day (BID) | ORAL | 3 refills | Status: DC
Start: 2015-11-24 — End: 2015-11-24

## 2015-11-24 MED ORDER — MORPHINE SULFATE 10 MG/5ML PO SOLN: 2.5000 mg | ORAL | 0 refills | Status: AC | PRN

## 2015-11-24 MED ORDER — TORSEMIDE 20 MG PO TABS: 80.0000 mg | ORAL_TABLET | Freq: Two times a day (BID) | ORAL | 3 refills | Status: AC

## 2015-11-24 MED ORDER — TORSEMIDE 20 MG PO TABS: 80.0000 mg | ORAL_TABLET | Freq: Two times a day (BID) | ORAL | Status: DC

## 2015-11-24 NOTE — Progress Notes (Signed)
Notified by Tomi Bamberger,  CMRN of family request for [facility] at home after discharge. Chart and patient information currently under review to confirm hospice eligibility.  Spoke with the patient, [name] Roberts, son and daughter  at bedside to initiate education related to hospice philosophy, services and team approach to care. Family verbalized understanding of the information provided. Per discussion plan is for discharge to home by personal vehicle today with family.    Please send signed completed DNR form home with patient.   Patient will need prescriptions for discharge comfort medications.   DME needs discussed and family requested a 3 in 1 BSC.  They already have a w/c and walker in the home.  [facility] equipment manager Jewel Kizzie Bane notified and will contact [facility] to arrange delivery to the home.  The home address has been verified and is correct in the chart; Satyam Cizek is the family member to be contacted to arrange time of delivery.  [facility] aware of the above.  Completed discharge summary will need to be faxed to [facility] at [phone] when final.  Please notify [facility] when patient is ready to leave unit at discharge-call [phone].  [facility] information and contact numbers have been given to Mrs. Wiland during visit.  Above information shared with Tomi Bamberger,  CMRN.  Please call with any questions.  Thank You,  Lavone Neri, RN  [facility] Liaison  [phone]

## 2015-11-24 NOTE — Progress Notes (Signed)
SUBJECTIVE:  Wants to go home. Denies SOB.  Poor UOP, creatinine higher.   Scheduled Meds: . ALPRAZolam  0.25 mg Oral QHS  . amiodarone  100 mg Oral Daily  . aspirin EC  81 mg Oral QHS  . atorvastatin  20 mg Oral q1800  . furosemide  160 mg Intravenous BID  . levothyroxine  75 mcg Oral QAC breakfast  . Melatonin  3 mg Oral QHS  . niacin  500 mg Oral QHS  . polyvinyl alcohol  1 drop Left Eye Daily  . potassium chloride  40 mEq Oral Daily  . sodium chloride flush  3 mL Intravenous Q12H   Continuous Infusions: . DOBUTamine 3 mcg/kg/min (11/22/15 1655)   PRN Meds:.sodium chloride, acetaminophen, fluticasone, morphine, ondansetron (ZOFRAN) IV, sodium chloride flush    Vitals:   11/23/15 2349 11/24/15 0400 11/24/15 0822 11/24/15 1219  BP: (!) 101/58 97/62 98/63  96/63  Pulse: 65 69 67 63  Resp: 12 13 14 15   Temp: 97.4 F (36.3 C) 97.4 F (36.3 C) 97.5 F (36.4 C) 97.5 F (36.4 C)  TempSrc:   Axillary Axillary  SpO2: 97% 99% 97% 100%  Weight:  151 lb 8 oz (68.7 kg)    Height:        Intake/Output Summary (Last 24 hours) at 11/24/15 1301 Last data filed at 11/24/15 0400  Gross per 24 hour  Intake            561.6 ml  Output                0 ml  Net            561.6 ml    LABS: Basic Metabolic Panel:  Recent Labs  78/29/5610/29/17 0422 11/24/15 0406  NA 127* 125*  K 4.2 4.2  CL 93* 91*  CO2 23 21*  GLUCOSE 92 88  BUN 87* 88*  CREATININE 4.43* 4.72*  CALCIUM 9.1 9.0   Liver Function Tests:  Recent Labs  11/22/15 1033  AST 46*  ALT 29  ALKPHOS 93  BILITOT 1.4*  PROT 6.7  ALBUMIN 3.5   No results for input(s): LIPASE, AMYLASE in the last 72 hours. CBC:  Recent Labs  11/22/15 1033 11/24/15 0406  WBC 6.3 6.2  HGB 12.3* 11.7*  HCT 37.3* 35.1*  MCV 93.5 93.6  PLT 125* 99*   Cardiac Enzymes: No results for input(s): CKTOTAL, CKMB, CKMBINDEX, TROPONINI in the last 72 hours. BNP: Invalid input(s): POCBNP D-Dimer: No results for input(s): DDIMER  in the last 72 hours. Hemoglobin A1C: No results for input(s): HGBA1C in the last 72 hours. Fasting Lipid Panel: No results for input(s): CHOL, HDL, LDLCALC, TRIG, CHOLHDL, LDLDIRECT in the last 72 hours. Thyroid Function Tests: No results for input(s): TSH, T4TOTAL, T3FREE, THYROIDAB in the last 72 hours.  Invalid input(s): FREET3 Anemia Panel: No results for input(s): VITAMINB12, FOLATE, FERRITIN, TIBC, IRON, RETICCTPCT in the last 72 hours.  RADIOLOGY: Dg Chest 2 View  Result Date: 11/22/2015 CLINICAL DATA:  Fluid overload EXAM: CHEST  2 VIEW COMPARISON:  09/20/2015 chest radiograph. FINDINGS: Sternotomy wires appear aligned and intact. Stable configuration of two lead left subclavian ICD and aortic valve prosthesis. Stable cardiomediastinal silhouette with mild cardiomegaly and aortic atherosclerosis. No pneumothorax. No pleural effusion. Cephalization of the pulmonary vasculature without overt pulmonary edema. No acute consolidative airspace disease. IMPRESSION: Stable mild cardiomegaly without overt pulmonary edema. Aortic atherosclerosis. Electronically Signed   By: Delbert PhenixJason A Poff M.D.   On:  11/22/2015 11:43   US Abdomen Limited  Result Date: 11/23/2015 CLINICAL DATA:  Evaluate for ascites EXAM: LIMITED ABDOMEN ULTRASOUND FOR ASCITES TECHNIQUE: Limited ultrasound survey for ascites was performed in all four abdominal quadrants. COMPARISON:  11/18/2015 limited abdominal sonogram FINDINGS: Small volume ascites is seen in all 4 abdominal quadrants, with the deepest ascites pocket in the right lower quadrant measuring 2.7 cm in depth. Incidentally noted diffusely irregular liver surface consistent with cirrhosis. IMPRESSION: 1. Small volume ascites, deepest in the right lower quadrant. 2. Cirrhosis. Electronically Signed   By: Delbert Phenix M.D.   On: 11/23/2015 10:17   US Abdomen Limited  Result Date: 11/18/2015 CLINICAL DATA:  History of congestive heart failure with recurrent abdominal  ascites. Request is made for therapeutic paracentesis with an of fluid is present. EXAM: US ABDOMEN LIMITED COMPARISON:  None. FINDINGS: Trace ascites noted throughout the abdomen. IMPRESSION: Minimal fluid. No safe window to proceed with therapeutic paracentesis. Read by: Barnetta Chapel, PA-C Electronically Signed   By: Corlis Leak M.D.   On: 11/18/2015 11:00   US Abdomen Limited  Result Date: 10/31/2015 CLINICAL DATA:  Abdominal ascites evaluation. EXAM: LIMITED ABDOMEN ULTRASOUND FOR ASCITES TECHNIQUE: Limited ultrasound survey for ascites was performed in all four abdominal quadrants. COMPARISON:  Abdominal ultrasound of September 22, 2015 FINDINGS: A small amount of ascites is noted in the lower quadrants bilaterally. No volume of fluid sufficient for successful paracentesis is observed. IMPRESSION: Small amount of ascites insufficient for successful paracentesis. Electronically Signed   By: David  [place] M.D.   On: 10/31/2015 11:07    PHYSICAL EXAM General: Frail. NAD. In bed. WIfe and daughter at the bedside.  Neck: JVP to jaw. No thyromegaly or thyroid nodule.  Lungs: Clear to auscultation bilaterally with normal respiratory effort. CV: Lateral PMI.  Heart irregular S1/S2, no S3/S4, no murmur.  No peripheral edema.   Abdomen: Soft, nontender, no hepatosplenomegaly, moderate distention.  Neurologic: Alert and oriented x 3.  Psych: Normal affect. Extremities: No clubbing or cyanosis.   TELEMETRY: A fib 60s   ASSESSMENT AND PLAN: 75 yo with end stage biventricular failure, CKD, CAD, cirrhosis presented with dyspnea, poor UOP, lethargy.  Thought to be in low output heart failure.  1. Acute on chronic systolic CHF: End stage ischemic cardiomyopathy, suspect low output. Echo in 8/17 with EF 20% and moderately dilated/severely dysfunctional RV. He has prominent RV failure. Has Medtronic ICD. RHC during 8/17 admission showed mildly elevated filling pressures and CI low at 1.98. Digoxin at low dose  was added but now off digoxin with AKI. He has been steadily worsening over the last several weeks with volume overload but creatinine also rising significantly.  Strong suspicion for low output failure.  He was started on dobutamine 3 at admission and high dose Lasix.  - Would avoid invasive procedures (RHC, PICC placement, etc) and treat HF empirically, suspect end-stage.  - He is not a candidate for advanced therapies with RV failure and renal failure.  Not a candidate for HD. Palliative Care team appreciated. DNR/DNI. Hospice referral.  - Stop dobutamine as he is going to transition to Hospice.   - Stop IV lasix. Transition to torsemide 80 mg twice a day.   -  Stopped Coreg with low output.  - He is not on ACEI/ARB given elevated creatinine.  - Off digoxin with AKI.  - Abdomen distended but only small ascites on [place] => no paracentesis.  2. 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 Suspect  progressive cardiorenal syndrome. Creatinine up to 4.72.   3. CAD: Stable, no chest pain. Continue ASA 81 and statin.  4. Hypothyroidism: In setting of amiodarone use. Levoxylfollowed by PCP.  5. Cirrhosis: ?Etiology, may be 2/2 to RV failure. Never a heavy drinker, viral hepatitis serologies negative. He already had cirrhosis on imaging prior to amiodarone initiation.  - NH3 22.  Wants to stop lactulose => will stop today .  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 Continue amiodarone to 100 mg daily with cirrhosis (though think cirrhosis pre-dated amiodarone).  7. S/p MV repair: Stable by echo in 8/17.  8. Atypical atrial flutter: Patient in atypical atrial flutter in 60s today.  This is new for him.  Given overall prognosis, would not anticoagulate.  Stop amiodarone 9. DNR/DNI- ICD deactivated.   Set up D/C with Hospice today.   Amy Clegg NP-C  11/24/2015 1:01 PM   Patient seen with NP, agree with the above note.  He has made no improvement on dobutamine.  Still poor UOP and creatinine  rising. We have no interventions that are going to make significant improvement.  I discussed the poor prognosis with patient and family.  He wants to go home with hospice.  I think this is very reasonable.  We will try to facilitate this today.  Will stop dobutamine and use torsemide 80 mg po bid for home.   Marca Ancona 11/24/2015 2:47 PM

## 2015-11-24 NOTE — Evaluation (Signed)
Physical Therapy Evaluation Patient Details Name: BERTIL SKATES MRN: [id number] DOB: 24-Feb-1940 Today's Date: 11/24/2015   History of Present Illness  75 y.o. male  with past medical history of CKD, CAD, Cirrhosis, sCHF, MI in 1981, ICD, RV failure, hypothyroidism,  admitted on 11/22/2015 with abdominal distension and decreased urine output despite diuretics.  Clinical Impression  Patient presents with decreased independence/safety with mobility due to deficits listed in PT problem list.  He will benefit from skilled PT in the acute setting to allow return home with family support and Hospice care.     Follow Up Recommendations  (plans for home with Hospice)    Equipment Recommendations  3in1 (PT)    Recommendations for Other Services       Precautions / Restrictions Precautions Precautions: Fall      Mobility  Bed Mobility Overal bed mobility: Needs Assistance Bed Mobility: Supine to Sit;Sit to Supine     Supine to sit: Supervision Sit to supine: Min assist   General bed mobility comments: assist for lines to come up to sit, assist to guide legs into bed and for technique, assist also for scooting up in bed  Transfers Overall transfer level: Needs assistance Equipment used: Rolling walker (2 wheeled) Transfers: Sit to/from Stand Sit to Stand: Supervision         General transfer comment: assist for lines/safety  Ambulation/Gait Ambulation/Gait assistance: Supervision;Min assist Ambulation Distance (Feet): 250 Feet Assistive device: 4-wheeled walker Gait Pattern/deviations: Step-through pattern;Trunk flexed;Decreased stride length     General Gait Details: increased speed as ambulating with assist to slow down,  walker a little short and pt leaning forward with increased forward momentum, stopped once due to obstacle in path to wait for removal, assist and cues for posture  Stairs            Wheelchair Mobility    Modified Rankin (Stroke Patients  Only)       Balance Overall balance assessment: Needs assistance           Standing balance-Leahy Scale: Fair Standing balance comment: can stand static without UE support, needs UE support for ambulation                             Pertinent Vitals/Pain Pain Assessment: No/denies pain    Home Living Family/patient expects to be discharged to:: Private residence Living Arrangements: Spouse/significant other;Children Available Help at Discharge: Family Type of Home: House Home Access: Stairs to enter Entrance Stairs-Rails: None Entrance Stairs-Number of Steps: 2 Home Layout: One level Home Equipment: Cane - single point;Walker - 4 wheels;Shower seat - built in;Grab bars - tub/shower;Wheelchair - manual      Prior Function Level of Independence: Independent with assistive device(s)               Hand Dominance   Dominant Hand: Right    Extremity/Trunk Assessment   Upper Extremity Assessment: Generalized weakness           Lower Extremity Assessment: Generalized weakness      Cervical / Trunk Assessment: Kyphotic  Communication   Communication: No difficulties  Cognition Arousal/Alertness: Awake/alert Behavior During Therapy: WFL for tasks assessed/performed Overall Cognitive Status: Within Functional Limits for tasks assessed                      General Comments      Exercises     Assessment/Plan    PT Assessment Patient  needs continued PT services  PT Problem List Decreased activity tolerance;Decreased balance;Decreased safety awareness;Decreased knowledge of use of DME          PT Treatment Interventions DME instruction;Gait training;Therapeutic exercise;Therapeutic activities;Balance training;Functional mobility training;Stair training    PT Goals (Current goals can be found in the Care Plan section)  Acute Rehab PT Goals Patient Stated Goal: To go home PT Goal Formulation: With patient/family Time For Goal  Achievement: 12/01/15 Potential to Achieve Goals: Good    Frequency Min 3X/week   Barriers to discharge        Co-evaluation               End of Session Equipment Utilized During Treatment: Gait belt Activity Tolerance: Patient tolerated treatment well Patient left: with call bell/phone within reach;in bed;with family/visitor present           Time: [id number] PT Time Calculation (min) (ACUTE ONLY): 26 min   Charges:   PT Evaluation $PT Eval Moderate Complexity: 1 Procedure PT Treatments $Gait Training: 8-22 mins   PT G CodesElray Mcgregor:        Cynthia Wynn 11/24/2015, 12:12 PM  Sheran Lawlessyndi Wynn, PT [phone] 11/24/2015

## 2015-11-24 NOTE — Discharge Summary (Signed)
Advanced Heart Failure Team  Discharge Summary   Patient ID: AKSHAJ [name] Roberts MRN: [id number], DOB/AGE: 75/06/1940 75 y.o. Admit date: 11/22/2015 D/C date:     11/24/2015   Primary Discharge Diagnoses:  1. A/C Systolic Heart Failure 2. 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 CAD 4. Hypothyroidism 5. Cirrhosis 6. PVC/NSVT 7. S/P MVR 8. Atypical A Flutter 9. DNR/DNI - ICD deactivated  Hospital Course:  [name] is a 75 yo with end stage biventricular failure, CKD, CAD, cirrhosis presented with dyspnea, poor UOP, lethargy.  Thought to be in low output heart failure and volume overloaded.   1. Acute on chronic systolic CHF: End stage ischemic cardiomyopathy, suspect low output. Echo in 8/17 with EF 20% and moderately dilated/severely dysfunctional RV. He has prominent RV failure. Has [employer] ICD. RHC during 8/17 admission showed mildly elevated filling pressures and CI low at 1.98. Digoxin at low dose was added but now off digoxin with AKI. He has been steadily worsening over the last several weeks with volume overload but creatinine also rising significantly. Strong suspicion for low output failure.  He was started on dobutamine 3 at admission and high dose Lasix but due to limited diuresis he was referred to Palliative Care.  - He is not a candidate for advanced therapies with RV failure and renal failure. Not a candidate for HD.  Palliative Care team appreciated. He is now a DNR/DNI and requested Hospice referral.  - Stop dobutamine as he is going to transition to Hospice.  - Limited diuresis with IV lasix. Transitioned to torsemide 80 mg twice a day.   - BB stopped due to low output.  - He is not on ACEI/ARB given elevated creatinine.  - Off digoxin with AKI.  2. AKI on CKD stage III-IV: He was not a candidate for HD. 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 Creatinine continued to trend up and peaked at 4.72.   3. CAD: Stable, no chest pain. Continue ASA 81 and statin for now.   4. Hypothyroidism: In setting of amiodarone use. Can stop Levoxylas he is transitioning to Hospice.   5. Cirrhosis: ?Etiology, may be 2/2 to RV failure. Never a heavy drinker, viral hepatitis serologies negative. He already had cirrhosis on imaging prior to amiodarone initiation.  - NH3 22. Stopped lactulose per patient request. .  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 Continue amiodarone to 100 mg daily with cirrhosis (though think cirrhosis pre-dated amiodarone).  7. S/p MV repair: Stable by echo in 8/17.  8. Atypical atrial flutter: Patient in atypical atrial flutter in 60s today. Given overall prognosis, would not anticoagulate.  9. DNR/DNI- ICD deactivated.   He is being discharged today with [facility] to consult tomorrow.   Discharge Weight: 151 pounds.   Discharge Vitals: Blood pressure 96/63, pulse 63, temperature 97.5 F (36.4 C), temperature source Axillary, resp. rate 15, height 5\' 3"  (1.6 m), weight 151 lb 8 oz (68.7 kg), SpO2 100 %.  Labs: Lab Results  Component Value Date   WBC 6.2 11/24/2015   HGB 11.7 (L) 11/24/2015   HCT 35.1 (L) 11/24/2015   MCV 93.6 11/24/2015   PLT 99 (L) 11/24/2015    Recent Labs Lab 11/22/15 1033  11/24/15 0406  NA 130*  < > 125*  K 4.7  < > 4.2  CL 96*  < > 91*  CO2 21*  < > 21*  BUN 84*  < > 88*  CREATININE 4.15*  < > 4.72*  CALCIUM 9.2  < >  9.0  PROT 6.7  --   --   BILITOT 1.4*  --   --   ALKPHOS 93  --   --   ALT 29  --   --   AST 46*  --   --   GLUCOSE 125*  < > 88  < > = values in this interval not displayed. Lab Results  Component Value Date   CHOL 64 06/11/2015   HDL 20.50 (L) 06/11/2015   LDLCALC 31 06/11/2015   TRIG 66.0 06/11/2015   BNP (last 3 results)  Recent Labs  09/20/15 1745 11/14/15 1146 11/17/15 1149  BNP 2,128.2* 3,359.0* 4,163.0*    ProBNP (last 3 results)  Recent Labs  06/20/15 1234  PROBNP 2,636.0*     Diagnostic Studies/Procedures   US Abdomen Limited  Result  Date: 11/23/2015 CLINICAL DATA:  Evaluate for ascites EXAM: LIMITED ABDOMEN ULTRASOUND FOR ASCITES TECHNIQUE: Limited ultrasound survey for ascites was performed in all four abdominal quadrants. COMPARISON:  11/18/2015 limited abdominal sonogram FINDINGS: Small volume ascites is seen in all 4 abdominal quadrants, with the deepest ascites pocket in the right lower quadrant measuring 2.7 cm in depth. Incidentally noted diffusely irregular liver surface consistent with cirrhosis. IMPRESSION: 1. Small volume ascites, deepest in the right lower quadrant. 2. Cirrhosis. Electronically Signed   By: Delbert Phenix M.D.   On: 11/23/2015 10:17    Discharge Medications     Medication List    STOP taking these medications   amiodarone 200 MG tablet Commonly known as:  PACERONE   atorvastatin 20 MG tablet Commonly known as:  LIPITOR   carvedilol 3.125 MG tablet Commonly known as:  COREG   lactulose 10 GM/15ML solution Commonly known as:  CHRONULAC   Melatonin 3 MG Caps   metolazone 2.5 MG tablet Commonly known as:  ZAROXOLYN   niacin 500 MG CR tablet Commonly known as:  NIASPAN   REFRESH 1.4-0.6 % ophthalmic solution Generic drug:  polyvinyl alcohol-povidone   sulfamethoxazole-trimethoprim 800-160 MG tablet Commonly known as:  BACTRIM DS,SEPTRA DS     TAKE these medications   allopurinol 100 MG tablet Commonly known as:  ZYLOPRIM TAKE 1 TABLET BY MOUTH DAILY. What changed:  See the new instructions.   ALPRAZolam 0.5 MG tablet Commonly known as:  XANAX Take 1/2 to 1 tab nightly before bed. What changed:  how much to take  how to take this  when to take this  additional instructions   aspirin EC 81 MG tablet Take 81 mg by mouth at bedtime.   fluticasone 50 MCG/ACT nasal spray Commonly known as:  FLONASE Place 2 sprays into both nostrils daily. What changed:  when to take this  reasons to take this   levothyroxine 50 MCG tablet Commonly known as:  SYNTHROID,  LEVOTHROID Take 1.5 tablets (75 mcg total) by mouth daily.   morphine 10 MG/5ML solution Take 1.3-2.5 mLs (2.6-5 mg total) by mouth every 2 (two) hours as needed for moderate pain or severe pain (dyspnea).   torsemide 20 MG tablet Commonly known as:  DEMADEX Take 4 tablets (80 mg total) by mouth 2 (two) times daily. What changed:  how much to take  how to take this  when to take this  additional instructions       Disposition   The patient will be discharged in stable condition to home. Discharge Instructions    Diet - low sodium heart healthy    Complete by:  As directed  Increase activity slowly    Complete by:  As directed      Follow-up Information    Marca Anconaalton McLean, MD. Go on 12/12/2015.   Specialty:  Cardiology Why:  at 0920 AM in the Advanced [facility] Clinic--gate code 4000--please bring all prescribed medications to appt Contact information: [address] [place] [address] [phone]        [facility] [place] .   Specialty:  Hospice and Palliative Medicine Why:  Registered Nurse.  Contact information: [address] [place] [address] [phone]             Duration of Discharge Encounter: Greater than 35 minutes   Signed, Amy Clegg  11/24/2015, 3:16 PM

## 2015-11-24 NOTE — Care Management Note (Signed)
Case Management Note  Patient Details  Name: DEMARIS LAVERTY MRN: [id number] Date of Birth: 10/07/1940  Subjective/Objective:   Pt presented for Heart Failure. Pt is for home. Plan will be to return home with Hospice Services. CM did provide Hospice Agency List to Family. [facility] was chosen.                   Action/Plan: CM did make referral with [facility]. Pt has DME WC and RW.  Pt would like DME 3n1. Plan will be for transport via car. No 02 needs identified.  No further needs from CM at this time.   Expected Discharge Date:                  Expected Discharge Plan:  Home w Hospice Care  In-House Referral:  NA  Discharge planning Services  CM Consult  Post Acute Care Choice:  Hospice Choice offered to:  Spouse  DME Arranged:  3-N-1 DME Agency:  [facility]  HH Arranged:  RN [facility] Agency:  [facility]  Status of Service:  Completed, signed off  If discussed at Long Length of Stay Meetings, dates discussed:    Additional Comments:  Gala Lewandowsky, RN 11/24/2015, 2:09 PM

## 2015-11-24 NOTE — Progress Notes (Signed)
Unable to measure accurate urine output due to pt states he has a BM each time he urinates and is not able to separate. Cont to monitor. Emelda Brothers RN

## 2015-11-25 ENCOUNTER — Encounter (HOSPITAL_COMMUNITY): Payer: Medicare HMO

## 2015-11-26 ENCOUNTER — Telehealth: Payer: Self-pay | Admitting: Cardiology

## 2015-11-27 NOTE — Telephone Encounter (Signed)
Spoke w/Sherry advised Hospice MD or PCP will need to order xanax, she states she did use standing orders to order pt Atarax

## 2015-11-28 ENCOUNTER — Telehealth (HOSPITAL_COMMUNITY): Payer: Self-pay | Admitting: Cardiology

## 2015-11-28 LAB — CUP PACEART REMOTE DEVICE CHECK
Date Time Interrogation Session: 20171017084226
HIGH POWER IMPEDANCE MEASURED VALUE: 52 Ohm
Implantable Lead Implant Date: 20150709
Implantable Pulse Generator Implant Date: 20150709
Lead Channel Impedance Value: 304 Ohm
Lead Channel Pacing Threshold Amplitude: 0.625 V
Lead Channel Pacing Threshold Pulse Width: 0.4 ms
Lead Channel Sensing Intrinsic Amplitude: 7.25 mV
Lead Channel Sensing Intrinsic Amplitude: 7.25 mV
Lead Channel Setting Pacing Pulse Width: 0.4 ms
MDC IDC LEAD LOCATION: 753860
MDC IDC MSMT BATTERY REMAINING LONGEVITY: 123 mo
MDC IDC MSMT BATTERY VOLTAGE: 2.99 V
MDC IDC MSMT LEADCHNL RV IMPEDANCE VALUE: 399 Ohm
MDC IDC SET LEADCHNL RV PACING AMPLITUDE: 2.5 V
MDC IDC SET LEADCHNL RV SENSING SENSITIVITY: 0.3 mV
MDC IDC STAT BRADY RV PERCENT PACED: 0.33 %

## 2015-11-28 NOTE — Telephone Encounter (Signed)
Joylene Igo- Hospice [phone] Called to request something different for itching, reports patient did start atarax earlier in the week however he did have to discontinue as is was making him too somnolent  Please advise for something else  Has used sarna cream with no relief.   advised to contact hospice providers for symptom management

## 2015-12-01 ENCOUNTER — Ambulatory Visit: Payer: Medicare HMO | Admitting: Cardiovascular Disease

## 2015-12-01 ENCOUNTER — Encounter: Payer: Self-pay | Admitting: Internal Medicine

## 2015-12-01 ENCOUNTER — Telehealth: Payer: Self-pay | Admitting: Cardiovascular Disease

## 2015-12-01 NOTE — Telephone Encounter (Signed)
New message    Patient transport to [facility] - end of life - today .

## 2015-12-02 ENCOUNTER — Encounter (HOSPITAL_COMMUNITY): Payer: Medicare HMO

## 2015-12-02 NOTE — Telephone Encounter (Signed)
Sent to Dr Shirlee Latch for notification

## 2015-12-03 ENCOUNTER — Telehealth: Payer: Self-pay | Admitting: Cardiology

## 2015-12-03 NOTE — Telephone Encounter (Signed)
Original Death Certificate received From [employer] [address]McLean Will be the Signing Physician I have sent the d/c interoffice to  CHF Clinic-attention [employer] aware also.

## 2015-12-10 ENCOUNTER — Telehealth (HOSPITAL_COMMUNITY): Payer: Self-pay

## 2015-12-10 NOTE — Telephone Encounter (Signed)
Certificate of death received through interoffice mail. Completed and signed by Dr. Shirlee Latch, copy placed in patient's chart, and original picked up by [employer].  Ave Filter, RN

## 2015-12-12 ENCOUNTER — Telehealth: Payer: Self-pay | Admitting: Cardiology

## 2015-12-12 ENCOUNTER — Inpatient Hospital Stay (HOSPITAL_COMMUNITY): Admit: 2015-12-12 | Payer: Medicare HMO

## 2015-12-12 NOTE — Telephone Encounter (Signed)
LMOVM reminding pt to send remote transmission.   

## 2015-12-17 ENCOUNTER — Ambulatory Visit: Payer: Medicare HMO | Admitting: Neurology

## 2015-12-26 NOTE — Telephone Encounter (Signed)
Pt is followed by Dr. Shirlee Latch at the CHF clinic.  Will forward to CHF staff.

## 2015-12-26 NOTE — Telephone Encounter (Signed)
[name]) wants to know [name] Roberts the adjust his Xanax dosage and he needs something for itching. Please call   Thanks

## 2015-12-26 DEATH — deceased

## 2016-01-02 ENCOUNTER — Ambulatory Visit: Payer: Self-pay | Admitting: Family Medicine

## 2016-03-10 ENCOUNTER — Encounter (HOSPITAL_COMMUNITY): Payer: Self-pay | Admitting: Family Medicine

## 2016-03-10 ENCOUNTER — Telehealth (HOSPITAL_COMMUNITY): Payer: Self-pay | Admitting: Family Medicine

## 2016-03-10 NOTE — Telephone Encounter (Signed)
Mailed letter with [facility] along with my chart message... KJ  °

## 2016-04-16 ENCOUNTER — Encounter: Payer: Self-pay | Admitting: Cardiology
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
